# Patient Record
Sex: Male | Born: 1975 | Race: Black or African American | Hispanic: No | Marital: Single | Smoking: Current every day smoker
Health system: Southern US, Community
[De-identification: ages and names within clinical notes are randomized; demographics above are authoritative.]

## PROBLEM LIST (undated history)

## (undated) DIAGNOSIS — G40909 Epilepsy, unspecified, not intractable, without status epilepticus: Secondary | ICD-10-CM

---

## 2003-04-27 ENCOUNTER — Emergency Department (HOSPITAL_COMMUNITY): Admission: AD | Admit: 2003-04-27 | Discharge: 2003-04-27 | Payer: Self-pay | Admitting: Family Medicine

## 2003-10-08 ENCOUNTER — Emergency Department (HOSPITAL_COMMUNITY): Admission: EM | Admit: 2003-10-08 | Discharge: 2003-10-09 | Payer: Self-pay | Admitting: Emergency Medicine

## 2003-10-20 ENCOUNTER — Emergency Department (HOSPITAL_COMMUNITY): Admission: EM | Admit: 2003-10-20 | Discharge: 2003-10-20 | Payer: Self-pay | Admitting: Family Medicine

## 2016-03-13 ENCOUNTER — Emergency Department (HOSPITAL_COMMUNITY)
Admission: EM | Admit: 2016-03-13 | Discharge: 2016-03-13 | Disposition: A | Discharge status: Home / Self Care | Payer: Self-pay | Attending: Emergency Medicine | Admitting: Emergency Medicine

## 2016-03-13 ENCOUNTER — Encounter (HOSPITAL_COMMUNITY): Payer: Self-pay | Admitting: Emergency Medicine

## 2016-03-13 ENCOUNTER — Emergency Department (HOSPITAL_COMMUNITY): Payer: Self-pay

## 2016-03-13 DIAGNOSIS — M62830 Muscle spasm of back: Secondary | ICD-10-CM | POA: Insufficient documentation

## 2016-03-13 DIAGNOSIS — Z79899 Other long term (current) drug therapy: Secondary | ICD-10-CM | POA: Insufficient documentation

## 2016-03-13 DIAGNOSIS — M545 Low back pain, unspecified: Secondary | ICD-10-CM

## 2016-03-13 DIAGNOSIS — F17200 Nicotine dependence, unspecified, uncomplicated: Secondary | ICD-10-CM | POA: Insufficient documentation

## 2016-03-13 MED ORDER — CYCLOBENZAPRINE HCL 10 MG PO TABS: 10.0000 mg | ORAL_TABLET | Freq: Two times a day (BID) | ORAL | 0 refills | Status: DC | PRN

## 2016-03-13 MED ORDER — OXYCODONE-ACETAMINOPHEN 5-325 MG PO TABS
1.0000 | ORAL_TABLET | Freq: Once | ORAL | Status: AC
  Administered 2016-03-13: 1 via ORAL
  Filled 2016-03-13: qty 1

## 2016-03-13 MED ORDER — CYCLOBENZAPRINE HCL 10 MG PO TABS
10.0000 mg | ORAL_TABLET | Freq: Once | ORAL | Status: AC
  Administered 2016-03-13: 10 mg via ORAL
  Filled 2016-03-13: qty 1

## 2016-03-13 MED ORDER — OXYCODONE-ACETAMINOPHEN 5-325 MG PO TABS: 1.0000 | ORAL_TABLET | Freq: Four times a day (QID) | ORAL | 0 refills | Status: DC | PRN

## 2016-03-13 NOTE — Discharge Instructions (Signed)
Please follow-up with a PCP for further management of your low back pain. Please take your medications if the symptoms are worsened. If you develop any new symptoms such as leg weakness, leg numbness, or loss of control of her bowel or bladder, please return to the nearest emergency department.

## 2016-03-13 NOTE — ED Provider Notes (Signed)
[facility]-EMERGENCY DEPT Provider Note   CSN: [id number] Arrival date & time: 03/13/16  [id number]     History   Chief Complaint Chief Complaint  Patient presents with  . Back Pain    HPI Charles Aguilar is a 40 y.o. 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 He described the pain as severe, nonradiating, and across his low back. He describes it as sharp and shooting. He says that movement, palpation, and twisting make it worse. He has pain with certain positions. He denies loss of bowel or bladder function, numbness, tingling, weakness, or problems walking. He has not taken any medicine to help his symptoms. He denies any other injury specifically, no chest pain, shortness of breath, abdominal pain, or leg pain.   Back Pain   This is a new problem. The current episode started 2 days ago. The problem occurs constantly. The problem has not changed since onset.The pain is associated with lifting heavy objects. The pain is present in the lumbar spine. The quality of the pain is described as shooting and aching. The pain does not radiate. The pain is at a severity of 7/10. The pain is moderate. The symptoms are aggravated by bending, twisting and certain positions. The pain is worse during the day. Pertinent negatives include no chest pain, no fever, no numbness, no headaches, no abdominal pain, no abdominal swelling, no bowel incontinence, no perianal numbness, no bladder incontinence, no dysuria, no pelvic pain, no leg pain, no paresthesias, no paresis, no tingling and no weakness. He has tried nothing for the symptoms. The treatment provided no relief.    History reviewed. No pertinent past medical history.  There are no active problems to display for this  patient.   History reviewed. No pertinent surgical history.     Home Medications    Prior to Admission medications   Not on File    Family History No family history on file.  Social History Social History  Substance Use Topics  . Smoking status: Current Some Day Smoker  . Smokeless tobacco: Never Used  . Alcohol use Yes     Comment: Occasionally     Allergies   Review of patient's allergies indicates not on file.   Review of Systems Review of Systems  Constitutional: Negative for activity change, chills, diaphoresis, fatigue and fever.  HENT: Negative for congestion and rhinorrhea.   Eyes: Negative for visual disturbance.  Respiratory: Negative for cough, chest tightness, shortness of breath, wheezing and stridor.   Cardiovascular: Negative for chest pain, palpitations and leg swelling.  Gastrointestinal: Negative for abdominal distention, abdominal pain, blood in stool, bowel incontinence, constipation, diarrhea, nausea and vomiting.  Genitourinary: Negative for bladder incontinence, difficulty urinating, dysuria, flank pain and pelvic pain.  Musculoskeletal: Positive for back pain. Negative for gait problem, neck pain and neck stiffness.  Skin: Negative for rash and wound.  Neurological: Negative for dizziness, tingling, weakness, light-headedness, numbness, headaches and paresthesias.  Psychiatric/Behavioral: Negative for agitation and confusion.  All other systems reviewed and are negative.    Physical Exam Updated Vital Signs BP (!) 152/117 (BP Location: Left Arm)   Pulse 90   Temp 97.7 F (36.5 C) (Oral)   Resp 18   Ht 6\' 2"  (1.88 m)   Wt  173 lb (78.5 kg)   SpO2 100%   BMI 22.21 kg/m   Physical Exam  Constitutional: He is oriented to person, place, and time. He appears well-developed and well-nourished.  HENT:  Head: Normocephalic and atraumatic.  Eyes: Conjunctivae are normal.  Neck: Neck supple.  Cardiovascular: Normal rate and regular  rhythm.   No murmur heard. Pulmonary/Chest: Effort normal and breath sounds normal. No respiratory distress.  Abdominal: Soft. There is no tenderness.  Musculoskeletal: He exhibits tenderness. He exhibits no edema.       Lumbar back: He exhibits tenderness, pain and spasm. He exhibits no bony tenderness.       Back:  Neurological: He is alert and oriented to person, place, and time. He has normal strength and normal reflexes. He is not disoriented. He displays no tremor and normal reflexes. No cranial nerve deficit or sensory deficit. He exhibits normal muscle tone. Coordination and gait normal. GCS eye subscore is 4. GCS verbal subscore is 5. GCS motor subscore is 6.  Skin: Skin is warm and dry.  Psychiatric: He has a normal mood and affect.  Nursing note and vitals reviewed.    ED Treatments / Results  Labs (all labs ordered are listed, but only abnormal results are displayed) Labs Reviewed - No data to display  EKG  EKG Interpretation None       Radiology Dg Lumbar Spine Complete  Result Date: 03/13/2016 CLINICAL DATA:  Mid low back pain with progression since lifting a box at work 2 days ago. EXAM: LUMBAR SPINE - COMPLETE 4+ VIEW COMPARISON:  None. FINDINGS: Five non rib-bearing lumbar type vertebral bodies are present. Vertebral body heights alignment are maintained. Disc spaces are maintained as well. There is some straightening of the normal lumbar lordosis. No focal lytic or blastic lesions are present. There is some suggestion of prominent disc at L4-5 and L5-S1. The SI joints are within normal limits. IMPRESSION: 1. No acute osseous abnormality. 2. There is some straightening of the normal lumbar lordosis. This is nonspecific, but can be seen in the setting of ongoing muscle strain or pain. 3. Question disc protrusion at L4-5 and/or L5-S1. Electronically Signed   By: Marin Robertshristopher  Mattern M.D.   On: 03/13/2016 10:43    Procedures Procedures (including critical care  time)  Medications Ordered in ED Medications  oxyCODONE-acetaminophen (PERCOCET/ROXICET) 5-325 MG per tablet 1 tablet (1 tablet Oral Given 03/13/16 1041)  cyclobenzaprine (FLEXERIL) tablet 10 mg (10 mg Oral Given 03/13/16 1041)     Initial Impression / Assessment and Plan / ED Course  I have reviewed the triage vital signs and the nursing notes.  Pertinent labs & imaging results that were available during my care of the patient were reviewed by me and considered in my medical decision making (see chart for details).  Clinical Course    Charles Aguilar is a 40 y.o. 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  History and exam are seen above.  Patient had tenderness on the paraspinal areas of his lumbar spine. No midline tenderness. No neurologic deficits and legs. New numbness, tingling, or weakness. Normal gait. Normal reflexes. Normal pulses. Clear lungs and nontender abdomen. No red flags for spinal injury.  Based on patient description of acute onset of back pain while lifting heavy object, suspect back strain as etiology of pain. X-ray obtained to look for malalignment or fracture.  X-ray showed No osseous abnormality and did show  evidence of muscle spasm/strain. They also questioned disk protrusion. Given lack of neurologic deficits and lower extremities or sciatic type pain, will hold on further imaging at this time.  Patient felt somewhat better after pain medicine and muscle relaxant.  Given reassuring imaging and reassuring exam, patient given prescriptions for pain medicine and muscle relaxant. Patient instructed to follow up with PCP for further ongoing back pain management. Patient given work note. Patient understood return precautions for any signs or symptoms of spinal cord injury. Patient had no other questions or concerns and was discharged in good condition.   Final Clinical Impressions(s) / ED  Diagnoses   Final diagnoses:  Acute bilateral low back pain without sciatica  Muscle spasm of back    New Prescriptions Discharge Medication List as of 03/13/2016 12:01 PM    START taking these medications   Details  cyclobenzaprine (FLEXERIL) 10 MG tablet Take 1 tablet (10 mg total) by mouth 2 (two) times daily as needed for muscle spasms., Starting Wed 03/13/2016, Print    oxyCODONE-acetaminophen (PERCOCET/ROXICET) 5-325 MG tablet Take 1 tablet by mouth every 6 (six) hours as needed for severe pain., Starting Wed 03/13/2016, Print        Clinical Impression: 1. Acute bilateral low back pain without sciatica   2. Muscle spasm of back     Disposition: Discharge  Condition: Good  I have discussed the results, Dx and Tx plan with the pt(& family if present). He/she/they expressed understanding and agree(s) with the plan. Discharge instructions discussed at great length. Strict return precautions discussed and pt &/or family have verbalized understanding of the instructions. No further questions at time of discharge.    Discharge Medication List as of 03/13/2016 12:01 PM    START taking these medications   Details  cyclobenzaprine (FLEXERIL) 10 MG tablet Take 1 tablet (10 mg total) by mouth 2 (two) times daily as needed for muscle spasms., Starting Wed 03/13/2016, Print    oxyCODONE-acetaminophen (PERCOCET/ROXICET) 5-325 MG tablet Take 1 tablet by mouth every 6 (six) hours as needed for severe pain., Starting Wed 03/13/2016, Print        Follow Up: [facility] [address] [place] [address] [phone]       Heide Scales, MD 03/13/16 2002

## 2016-03-13 NOTE — ED Triage Notes (Addendum)
Patient reports lower back pain since Monday. Reports lifting something heavy at work and has had pain ever since. Patient is ambulatory. Denies bowel or bladder changes. Denies numbness.

## 2017-05-01 ENCOUNTER — Other Ambulatory Visit: Payer: Self-pay

## 2017-05-01 ENCOUNTER — Encounter (HOSPITAL_BASED_OUTPATIENT_CLINIC_OR_DEPARTMENT_OTHER): Payer: Self-pay | Admitting: Emergency Medicine

## 2017-05-01 ENCOUNTER — Emergency Department (HOSPITAL_BASED_OUTPATIENT_CLINIC_OR_DEPARTMENT_OTHER): Payer: Self-pay

## 2017-05-01 ENCOUNTER — Emergency Department (HOSPITAL_BASED_OUTPATIENT_CLINIC_OR_DEPARTMENT_OTHER)
Admission: EM | Admit: 2017-05-01 | Discharge: 2017-05-01 | Disposition: A | Discharge status: Home / Self Care | Payer: Self-pay | Attending: Emergency Medicine | Admitting: Emergency Medicine

## 2017-05-01 DIAGNOSIS — J189 Pneumonia, unspecified organism: Secondary | ICD-10-CM | POA: Insufficient documentation

## 2017-05-01 DIAGNOSIS — F17200 Nicotine dependence, unspecified, uncomplicated: Secondary | ICD-10-CM | POA: Insufficient documentation

## 2017-05-01 LAB — BASIC METABOLIC PANEL
Anion gap: 15 (ref 5–15)
BUN: 8 mg/dL (ref 6–20)
CO2: 23 mmol/L (ref 22–32)
Calcium: 9 mg/dL (ref 8.9–10.3)
Chloride: 91 mmol/L — ABNORMAL LOW (ref 101–111)
Creatinine, Ser: 0.78 mg/dL (ref 0.61–1.24)
GFR calc Af Amer: >60 mL/min (ref >60)
GFR calc non Af Amer: >60 mL/min (ref >60)
Glucose, Bld: 114 mg/dL — ABNORMAL HIGH (ref 65–99)
Potassium: 3.6 mmol/L (ref 3.5–5.1)
Sodium: 129 mmol/L — ABNORMAL LOW (ref 135–145)

## 2017-05-01 LAB — CBC WITH DIFFERENTIAL/PLATELET
Basophils Absolute: 0 10*3/uL (ref 0.0–0.1)
Basophils Relative: 0 %
Eosinophils Absolute: 0 10*3/uL (ref 0.0–0.7)
Eosinophils Relative: 0 %
HCT: 42 % (ref 39.0–52.0)
Hemoglobin: 14.7 g/dL (ref 13.0–17.0)
Lymphocytes Relative: 10 %
Lymphs Abs: 1 10*3/uL (ref 0.7–4.0)
MCH: 34.2 pg — ABNORMAL HIGH (ref 26.0–34.0)
MCHC: 35 g/dL (ref 30.0–36.0)
MCV: 97.7 fL (ref 78.0–100.0)
Monocytes Absolute: 1.5 10*3/uL — ABNORMAL HIGH (ref 0.1–1.0)
Monocytes Relative: 15 %
Neutro Abs: 7.6 10*3/uL (ref 1.7–7.7)
Neutrophils Relative %: 75 %
Platelets: 224 10*3/uL (ref 150–400)
RBC: 4.3 MIL/uL (ref 4.22–5.81)
RDW: 11 % — ABNORMAL LOW (ref 11.5–15.5)
WBC: 10.1 10*3/uL (ref 4.0–10.5)

## 2017-05-01 LAB — I-STAT CG4 LACTIC ACID, ED: Lactic Acid, Venous: 1.45 mmol/L (ref 0.5–1.9)

## 2017-05-01 LAB — LACTATE DEHYDROGENASE: LDH: 155 U/L (ref 98–192)

## 2017-05-01 LAB — HEPATIC FUNCTION PANEL
ALT: 78 U/L — ABNORMAL HIGH (ref 17–63)
AST: 83 U/L — ABNORMAL HIGH (ref 15–41)
Albumin: 3 g/dL — ABNORMAL LOW (ref 3.5–5.0)
Alkaline Phosphatase: 72 U/L (ref 38–126)
Bilirubin, Direct: 0.2 mg/dL (ref 0.1–0.5)
Indirect Bilirubin: 0.6 mg/dL (ref 0.3–0.9)
Total Bilirubin: 0.8 mg/dL (ref 0.3–1.2)
Total Protein: 7.3 g/dL (ref 6.5–8.1)

## 2017-05-01 MED ORDER — SODIUM CHLORIDE 0.9 % IV BOLUS (SEPSIS)
1000.0000 mL | Freq: Once | INTRAVENOUS | Status: AC
  Administered 2017-05-01: 1000 mL via INTRAVENOUS

## 2017-05-01 MED ORDER — NAPROXEN 500 MG PO TABS: 500.0000 mg | ORAL_TABLET | Freq: Two times a day (BID) | ORAL | 0 refills | Status: DC

## 2017-05-01 MED ORDER — LEVOFLOXACIN 500 MG PO TABS: 500.0000 mg | ORAL_TABLET | Freq: Every day | ORAL | 0 refills | Status: DC

## 2017-05-01 MED ORDER — BENZONATATE 100 MG PO CAPS: 100.0000 mg | ORAL_CAPSULE | Freq: Three times a day (TID) | ORAL | 0 refills | Status: DC

## 2017-05-01 MED ORDER — KETOROLAC TROMETHAMINE 30 MG/ML IJ SOLN
30.0000 mg | Freq: Once | INTRAMUSCULAR | Status: AC
  Administered 2017-05-01: 30 mg via INTRAVENOUS
  Filled 2017-05-01: qty 1

## 2017-05-01 MED ORDER — LEVOFLOXACIN 750 MG PO TABS
750.0000 mg | ORAL_TABLET | Freq: Once | ORAL | Status: AC
  Administered 2017-05-01: 750 mg via ORAL
  Filled 2017-05-01: qty 1

## 2017-05-01 MED ORDER — ONDANSETRON HCL 4 MG/2ML IJ SOLN
4.0000 mg | Freq: Once | INTRAMUSCULAR | Status: AC
  Administered 2017-05-01: 4 mg via INTRAVENOUS
  Filled 2017-05-01: qty 2

## 2017-05-01 MED ORDER — PROMETHAZINE-DM 6.25-15 MG/5ML PO SYRP: 5.0000 mL | ORAL_SOLUTION | Freq: Four times a day (QID) | ORAL | 0 refills | Status: DC | PRN

## 2017-05-01 MED ORDER — ONDANSETRON HCL 4 MG/2ML IJ SOLN
INTRAMUSCULAR | Status: AC
  Filled 2017-05-01: qty 2

## 2017-05-01 NOTE — ED Provider Notes (Addendum)
[facility] EMERGENCY DEPARTMENT Provider Note   CSN: [id number] Arrival date & time: 05/01/17  1646     History   Chief Complaint Chief Complaint  Patient presents with  . Fever  . Cough    HPI Wilnette KalesShuntrey Len is a 41 y.o. male. Chief complaint is fever, cough, body aches  HPI 41 year old male with a three-day illness. Body aches cough and fever. Had a coworker that was ill with similar symptoms one week ago. No hemoptysis. No sudden single Reiger. Nausea. No vomiting. Poor appetite. He presents for evaluation. Generalized weakness. Not lightheaded or syncopal.  History reviewed. No pertinent past medical history.  There are no active problems to display for this patient.   History reviewed. No pertinent surgical history.     Home Medications    Prior to Admission medications   Medication Sig Start Date End Date Taking? Authorizing Provider  benzonatate (TESSALON) 100 MG capsule Take 1 capsule (100 mg total) by mouth every 8 (eight) hours. 05/01/17   Rolland PorterJames, Aarya Quebedeaux, MD  cyclobenzaprine (FLEXERIL) 10 MG tablet Take 1 tablet (10 mg total) by mouth 2 (two) times daily as needed for muscle spasms. 03/13/16   Tegeler, Canary Brimhristopher J, MD  ibuprofen (ADVIL,MOTRIN) 200 MG tablet Take 200-800 mg by mouth every 6 (six) hours as needed for headache or moderate pain.    [provider]  levofloxacin (LEVAQUIN) 500 MG tablet Take 1 tablet (500 mg total) by mouth daily. 05/01/17   Rolland PorterJames, Mackenzy Grumbine, MD  naproxen (NAPROSYN) 500 MG tablet Take 1 tablet (500 mg total) by mouth 2 (two) times daily. 05/01/17   Rolland PorterJames, Atziry Baranski, MD  oxyCODONE-acetaminophen (PERCOCET/ROXICET) 5-325 MG tablet Take 1 tablet by mouth every 6 (six) hours as needed for severe pain. 03/13/16   Tegeler, Canary Brimhristopher J, MD  promethazine-dextromethorphan (PROMETHAZINE-DM) 6.25-15 MG/5ML syrup Take 5 mLs by mouth 4 (four) times daily as needed for cough. 05/01/17   Rolland PorterJames, Adelie Croswell, MD    Family History History  reviewed. No pertinent family history.  Social History Social History   Tobacco Use  . Smoking status: Current Some Day Smoker  . Smokeless tobacco: Never Used  Substance Use Topics  . Alcohol use: Yes    Comment: Occasionally  . Drug use: Not on file     Allergies   Penicillins   Review of Systems Review of Systems  Constitutional: Positive for chills, fatigue and fever. Negative for appetite change and diaphoresis.  HENT: Negative for mouth sores, sore throat and trouble swallowing.   Eyes: Negative for visual disturbance.  Respiratory: Positive for cough. Negative for chest tightness, shortness of breath and wheezing.   Cardiovascular: Negative for chest pain.  Gastrointestinal: Negative for abdominal distention, abdominal pain, diarrhea, nausea and vomiting.  Endocrine: Negative for polydipsia, polyphagia and polyuria.  Genitourinary: Negative for dysuria, frequency and hematuria.  Musculoskeletal: Positive for arthralgias and myalgias. Negative for gait problem.  Skin: Negative for color change, pallor and rash.  Neurological: Positive for weakness. Negative for dizziness, syncope, light-headedness and headaches.  Hematological: Does not bruise/bleed easily.  Psychiatric/Behavioral: Negative for behavioral problems and confusion.     Physical Exam Updated Vital Signs BP 140/68 (BP Location: Left Arm)   Pulse 84   Temp 98.6 F (37 C) (Oral)   Resp 18   SpO2 100%   Physical Exam  Constitutional: He is oriented to person, place, and time. He appears well-developed and well-nourished. No distress.  HENT:  Head: Normocephalic.  Eyes: Conjunctivae are normal. Pupils are  equal, round, and reactive to light. No scleral icterus.  Neck: Normal range of motion. Neck supple. No thyromegaly present.  Cardiovascular: Normal rate and regular rhythm. Exam reveals no gallop and no friction rub.  No murmur heard. Pulmonary/Chest: Effort normal and breath sounds normal. No  respiratory distress. He has no wheezes. He has no rales.  Right basilar crackles.  Abdominal: Soft. Bowel sounds are normal. He exhibits no distension. There is no tenderness. There is no rebound.  Musculoskeletal: Normal range of motion.  Neurological: He is alert and oriented to person, place, and time.  Skin: Skin is warm and dry. No rash noted.  Psychiatric: He has a normal mood and affect. His behavior is normal.     ED Treatments / Results  Labs (all labs ordered are listed, but only abnormal results are displayed) Labs Reviewed  CBC WITH DIFFERENTIAL/PLATELET - Abnormal; Notable for the following components:      Result Value   MCH 34.2 (*)    RDW 11.0 (*)    Monocytes Absolute 1.5 (*)    All other components within normal limits  BASIC METABOLIC PANEL - Abnormal; Notable for the following components:   Sodium 129 (*)    Chloride 91 (*)    Glucose, Bld 114 (*)    All other components within normal limits  HEPATIC FUNCTION PANEL - Abnormal; Notable for the following components:   Albumin 3.0 (*)    AST 83 (*)    ALT 78 (*)    All other components within normal limits  LACTATE DEHYDROGENASE  I-STAT CG4 LACTIC ACID, ED    EKG  EKG Interpretation None       Radiology Dg Chest 2 View  Result Date: 05/01/2017 CLINICAL DATA:  Fever with nausea, vomiting and diarrhea as well as sore throat generalized chest pain. Productive cough. EXAM: CHEST  2 VIEW COMPARISON:  None. FINDINGS: Lungs are adequately inflated demonstrate a bilateral patchy perihilar and lingular airspace process likely a pneumonia. No evidence of effusion. Cardiomediastinal silhouette and remainder of the exam is unremarkable. IMPRESSION: Patchy bilateral perihilar and lingular airspace process likely a pneumonia. Electronically Signed   By: Elberta Fortisaniel  Boyle M.D.   On: 05/01/2017 18:09    Procedures Procedures (including critical care time)  Medications Ordered in ED Medications  ondansetron (ZOFRAN)  injection 4 mg (not administered)  sodium chloride 0.9 % bolus 1,000 mL (0 mLs Intravenous Stopping Infusion hung by another clincian 05/01/17 1826)  ketorolac (TORADOL) 30 MG/ML injection 30 mg (30 mg Intravenous Given 05/01/17 1826)  levofloxacin (LEVAQUIN) tablet 750 mg (750 mg Oral Given 05/01/17 1826)  sodium chloride 0.9 % bolus 1,000 mL (1,000 mLs Intravenous New Bag/Given 05/01/17 1831)     Initial Impression / Assessment and Plan / ED Course  I have reviewed the triage vital signs and the nursing notes.  Pertinent labs & imaging results that were available during my care of the patient were reviewed by me and considered in my medical decision making (see chart for details).    Appears to not feel well. But he is awake alert and mentating well. Does not appear septic or toxic. Reassuring lactate. Slight hyponatremia. Minimal elevation of AST ALT. Chest x-ray shows bilateral bibasilar infiltrates. Not lobar. But not diffuse. Consideration will be given for streptococcal pneumonia. May be atypical as well. Levaquin would be good coverage considering his penicillins.   Final Clinical Impressions(s) / ED Diagnoses   Final diagnoses:  Community acquired pneumonia, unspecified laterality  Patient given IV fluids. Given by mouth Levaquin. Oral. Zofran. Taking by mouth. Feels "much much better". Oxygenating well. Latoya without tachycardia or hypoxemia. Discharge home. Levaquin, Phenergan DM, Zofran, primary care follow-up. ER with acute changes.  ED Discharge Orders        Ordered    promethazine-dextromethorphan (PROMETHAZINE-DM) 6.25-15 MG/5ML syrup  4 times daily PRN     05/01/17 2041    levofloxacin (LEVAQUIN) 500 MG tablet  Daily     05/01/17 2041    naproxen (NAPROSYN) 500 MG tablet  2 times daily     05/01/17 2041    benzonatate (TESSALON) 100 MG capsule  Every 8 hours     05/01/17 2041       Rolland Porter, MD 05/01/17 2047    Rolland Porter, MD 05/01/17 2048

## 2017-05-01 NOTE — ED Triage Notes (Signed)
Pt reports fever and cough x 3 days , chest pain when coughing. Body ache and been exposed to sick coworker and has a sick child at home with similar symptoms.

## 2017-05-01 NOTE — Discharge Instructions (Signed)
Push fluids to stay hydrated. Levaquin, antibiotics, as prescribed for 10 days. Tessalon for daytime cough. Phenergan DM for nighttime cough. Naproxen for body aches and pain Return to ER with worsening.

## 2017-05-01 NOTE — ED Notes (Signed)
ED Provider at bedside. 

## 2017-05-01 NOTE — ED Notes (Signed)
Patient transported to X-ray 

## 2018-09-01 ENCOUNTER — Emergency Department (HOSPITAL_BASED_OUTPATIENT_CLINIC_OR_DEPARTMENT_OTHER)
Admission: EM | Admit: 2018-09-01 | Discharge: 2018-09-01 | Disposition: A | Discharge status: Home / Self Care | Payer: Managed Care, Other (non HMO) | Attending: Emergency Medicine | Admitting: Emergency Medicine

## 2018-09-01 ENCOUNTER — Encounter (HOSPITAL_BASED_OUTPATIENT_CLINIC_OR_DEPARTMENT_OTHER): Payer: Self-pay | Admitting: Emergency Medicine

## 2018-09-01 ENCOUNTER — Other Ambulatory Visit: Payer: Self-pay

## 2018-09-01 DIAGNOSIS — F17210 Nicotine dependence, cigarettes, uncomplicated: Secondary | ICD-10-CM | POA: Insufficient documentation

## 2018-09-01 DIAGNOSIS — Z79899 Other long term (current) drug therapy: Secondary | ICD-10-CM | POA: Insufficient documentation

## 2018-09-01 DIAGNOSIS — R112 Nausea with vomiting, unspecified: Secondary | ICD-10-CM

## 2018-09-01 DIAGNOSIS — R55 Syncope and collapse: Secondary | ICD-10-CM

## 2018-09-01 DIAGNOSIS — R531 Weakness: Secondary | ICD-10-CM | POA: Diagnosis present

## 2018-09-01 DIAGNOSIS — R1110 Vomiting, unspecified: Secondary | ICD-10-CM | POA: Insufficient documentation

## 2018-09-01 NOTE — ED Notes (Signed)
Patient denies pain and is resting comfortably.  

## 2018-09-01 NOTE — ED Provider Notes (Signed)
[facility] EMERGENCY DEPARTMENT Provider Note   CSN: [id number] Arrival date & time: 09/01/18  2124    History   Chief Complaint Chief Complaint  Patient presents with  . Abdominal Pain    HPI Charles Aguilar is a 43 y.o. male.     HPI  42yM with vague feeling of weakness. Today around 3pm he started feeling weak.  States he went and used the restroom and had a small bowel movement and when he was done he felt weak and had something to drink and after he did he had some vomiting that was yellow in color  States he has only had the one episode of vomiting and only the one small episode of loose stool  Pt states now he feels weak all over  Denies pain at this time or previously.    History reviewed. No pertinent past medical history.  There are no active problems to display for this patient.   History reviewed. No pertinent surgical history.      Home Medications    Prior to Admission medications   Medication Sig Start Date End Date Taking? Authorizing Provider  benzonatate (TESSALON) 100 MG capsule Take 1 capsule (100 mg total) by mouth every 8 (eight) hours. 05/01/17   Rolland PorterJames, Mark, MD  cyclobenzaprine (FLEXERIL) 10 MG tablet Take 1 tablet (10 mg total) by mouth 2 (two) times daily as needed for muscle spasms. 03/13/16   Tegeler, Canary Brimhristopher J, MD  ibuprofen (ADVIL,MOTRIN) 200 MG tablet Take 200-800 mg by mouth every 6 (six) hours as needed for headache or moderate pain.    [provider]  levofloxacin (LEVAQUIN) 500 MG tablet Take 1 tablet (500 mg total) by mouth daily. 05/01/17   Rolland PorterJames, Mark, MD  naproxen (NAPROSYN) 500 MG tablet Take 1 tablet (500 mg total) by mouth 2 (two) times daily. 05/01/17   Rolland PorterJames, Mark, MD  oxyCODONE-acetaminophen (PERCOCET/ROXICET) 5-325 MG tablet Take 1 tablet by mouth every 6 (six) hours as needed for severe pain. 03/13/16   Tegeler, Canary Brimhristopher J, MD  promethazine-dextromethorphan (PROMETHAZINE-DM) 6.25-15 MG/5ML syrup  Take 5 mLs by mouth 4 (four) times daily as needed for cough. 05/01/17   Rolland PorterJames, Mark, MD    Family History Family History  Problem Relation Age of Onset  . Diabetes Mother   . Diabetes Father     Social History Social History   Tobacco Use  . Smoking status: Current Every Day Smoker    Types: Cigarettes, Cigars  . Smokeless tobacco: Never Used  Substance Use Topics  . Alcohol use: Yes    Comment: Occasionally  . Drug use: Never     Allergies   Penicillins   Review of Systems Review of Systems  All systems reviewed and negative, other than as noted in HPI.  Physical Exam Updated Vital Signs BP (!) 137/96 (BP Location: Right Arm)   Pulse 100   Temp 98.4 F (36.9 C) (Oral)   Resp 18   Ht 6\' 2"  (1.88 m)   Wt 75.8 kg   SpO2 100%   BMI 21.44 kg/m   Physical Exam Vitals signs and nursing note reviewed.  Constitutional:      General: He is not in acute distress.    Appearance: He is well-developed.  HENT:     Head: Normocephalic and atraumatic.  Eyes:     General:        Right eye: No discharge.        Left eye: No discharge.  Conjunctiva/sclera: Conjunctivae normal.  Neck:     Musculoskeletal: Neck supple.  Cardiovascular:     Rate and Rhythm: Normal rate and regular rhythm.     Heart sounds: Normal heart sounds. No murmur. No friction rub. No gallop.   Pulmonary:     Effort: Pulmonary effort is normal. No respiratory distress.     Breath sounds: Normal breath sounds.  Abdominal:     General: There is no distension.     Palpations: Abdomen is soft.     Tenderness: There is no abdominal tenderness.  Musculoskeletal:        General: No tenderness.  Skin:    General: Skin is warm and dry.  Neurological:     General: No focal deficit present.     Mental Status: He is alert and oriented to person, place, and time.     Cranial Nerves: No cranial nerve deficit.     Motor: No weakness.  Psychiatric:        Mood and Affect: Mood normal.         Behavior: Behavior normal.        Thought Content: Thought content normal.      ED Treatments / Results  Labs (all labs ordered are listed, but only abnormal results are displayed) Labs Reviewed - No data to display  EKG EKG Interpretation  Date/Time:  Tuesday September 01 2018 22:03:44 EDT Ventricular Rate:  80 PR Interval:    QRS Duration: 85 QT Interval:  395 QTC Calculation: 456 R Axis:   97 Text Interpretation:  Sinus rhythm Anterolateral infarct, old Baseline wander in lead(s) I III aVL No old tracing to compare Confirmed by Raeford Razor [phone]) on 09/01/2018 10:05:46 PM   Radiology No results found.  Procedures Procedures (including critical care time)  Medications Ordered in ED Medications - No data to display   Initial Impression / Assessment and Plan / ED Course  I have reviewed the triage vital signs and the nursing notes.  Pertinent labs & imaging results that were available during my care of the patient were reviewed by me and considered in my medical decision making (see chart for details).  42yM with what I think may have been a near syncopal event? No essentially symptom free although still feels vaguely weak. I think he has significant concern about this possibly being COVID related as he mentioned a couple times about wanting to be checked out because "I have a daughter in the house." He appears well. Afebrile. No respiratory complaints. o2 sats normal on RA. Has been feeling fine up until this. Almost back to his baseline. Tried to reassure. Rest. Fluids. EKG w/o overt concerning findings. He declined further testing and I think it's probably not even necessary. Return precautions were discussed. He feels over work. Provided with a work note for tomorrow. Advised I want him rechecked if he isn't feeling like his normal self tomorrow or develops repeat/new symptoms.    Charles Aguilar was evaluated in Emergency Department on 09/04/2018 for the symptoms  described in the history of present illness. He was evaluated in the context of the global COVID-19 pandemic, which necessitated consideration that the patient might be at risk for infection with the SARS-CoV-2 virus that causes COVID-19. Institutional protocols and algorithms that pertain to the evaluation of patients at risk for COVID-19 are in a state of rapid change based on information released by regulatory bodies including the CDC and federal and state organizations. These policies and algorithms were followed  during the patient's care in the ED.  Final Clinical Impressions(s) / ED Diagnoses   Final diagnoses:  Nausea and vomiting, intractability of vomiting not specified, unspecified vomiting type  Near syncope    ED Discharge Orders    None       Raeford Razor, MD 09/04/18 2048

## 2018-09-01 NOTE — ED Triage Notes (Signed)
Today around 3pm he started feeling weak  States he went and used the restroom and had a small bowel movement and when he was done he felt weak and had something to drink and after he did he had some vomiting that was yellow in color  States he has only had the one episode of vomiting and only the one small episode of loose stool  Pt states now he feels weak all over  Denies pain at this time

## 2018-09-01 NOTE — ED Notes (Signed)
Pt. Reports he is exhausted from working and works day and night and is feeling so tired.

## 2018-10-28 ENCOUNTER — Emergency Department (HOSPITAL_BASED_OUTPATIENT_CLINIC_OR_DEPARTMENT_OTHER)
Admission: EM | Admit: 2018-10-28 | Discharge: 2018-10-28 | Disposition: A | Discharge status: Home / Self Care | Payer: Managed Care, Other (non HMO) | Attending: Emergency Medicine | Admitting: Emergency Medicine

## 2018-10-28 ENCOUNTER — Encounter (HOSPITAL_BASED_OUTPATIENT_CLINIC_OR_DEPARTMENT_OTHER): Payer: Self-pay

## 2018-10-28 ENCOUNTER — Other Ambulatory Visit: Payer: Self-pay

## 2018-10-28 DIAGNOSIS — Z79899 Other long term (current) drug therapy: Secondary | ICD-10-CM | POA: Diagnosis not present

## 2018-10-28 DIAGNOSIS — Z20828 Contact with and (suspected) exposure to other viral communicable diseases: Secondary | ICD-10-CM | POA: Insufficient documentation

## 2018-10-28 DIAGNOSIS — F17210 Nicotine dependence, cigarettes, uncomplicated: Secondary | ICD-10-CM | POA: Diagnosis not present

## 2018-10-28 DIAGNOSIS — R197 Diarrhea, unspecified: Secondary | ICD-10-CM | POA: Insufficient documentation

## 2018-10-28 DIAGNOSIS — F17290 Nicotine dependence, other tobacco product, uncomplicated: Secondary | ICD-10-CM | POA: Diagnosis not present

## 2018-10-28 DIAGNOSIS — R112 Nausea with vomiting, unspecified: Secondary | ICD-10-CM | POA: Insufficient documentation

## 2018-10-28 DIAGNOSIS — R748 Abnormal levels of other serum enzymes: Secondary | ICD-10-CM | POA: Diagnosis not present

## 2018-10-28 LAB — CBC WITH DIFFERENTIAL/PLATELET
Abs Immature Granulocytes: 0.02 10*3/uL (ref 0.00–0.07)
Basophils Absolute: 0 10*3/uL (ref 0.0–0.1)
Basophils Relative: 0 %
Eosinophils Absolute: 0 10*3/uL (ref 0.0–0.5)
Eosinophils Relative: 0 %
HCT: 42.5 % (ref 39.0–52.0)
Hemoglobin: 14.6 g/dL (ref 13.0–17.0)
Immature Granulocytes: 0 %
Lymphocytes Relative: 21 %
Lymphs Abs: 1.1 10*3/uL (ref 0.7–4.0)
MCH: 34.5 pg — ABNORMAL HIGH (ref 26.0–34.0)
MCHC: 34.4 g/dL (ref 30.0–36.0)
MCV: 100.5 fL — ABNORMAL HIGH (ref 80.0–100.0)
Monocytes Absolute: 0.7 10*3/uL (ref 0.1–1.0)
Monocytes Relative: 14 %
Neutro Abs: 3.5 10*3/uL (ref 1.7–7.7)
Neutrophils Relative %: 65 %
Platelets: 194 10*3/uL (ref 150–400)
RBC: 4.23 MIL/uL (ref 4.22–5.81)
RDW: 11.9 % (ref 11.5–15.5)
WBC: 5.4 10*3/uL (ref 4.0–10.5)
nRBC: 0 % (ref 0.0–0.2)

## 2018-10-28 LAB — COMPREHENSIVE METABOLIC PANEL
ALT: 100 U/L — ABNORMAL HIGH (ref 0–44)
AST: 125 U/L — ABNORMAL HIGH (ref 15–41)
Albumin: 4.5 g/dL (ref 3.5–5.0)
Alkaline Phosphatase: 102 U/L (ref 38–126)
Anion gap: 16 — ABNORMAL HIGH (ref 5–15)
BUN: 11 mg/dL (ref 6–20)
CO2: 24 mmol/L (ref 22–32)
Calcium: 9.6 mg/dL (ref 8.9–10.3)
Chloride: 93 mmol/L — ABNORMAL LOW (ref 98–111)
Creatinine, Ser: 0.8 mg/dL (ref 0.61–1.24)
GFR calc Af Amer: >60 mL/min (ref >60)
GFR calc non Af Amer: >60 mL/min (ref >60)
Glucose, Bld: 131 mg/dL — ABNORMAL HIGH (ref 70–99)
Potassium: 3.5 mmol/L (ref 3.5–5.1)
Sodium: 133 mmol/L — ABNORMAL LOW (ref 135–145)
Total Bilirubin: 1.5 mg/dL — ABNORMAL HIGH (ref 0.3–1.2)
Total Protein: 9.8 g/dL — ABNORMAL HIGH (ref 6.5–8.1)

## 2018-10-28 LAB — LIPASE, BLOOD: Lipase: 43 U/L (ref 11–51)

## 2018-10-28 MED ORDER — SODIUM CHLORIDE 0.9 % IV BOLUS
1000.0000 mL | Freq: Once | INTRAVENOUS | Status: AC
  Administered 2018-10-28: 12:00:00 1000 mL via INTRAVENOUS

## 2018-10-28 MED ORDER — ONDANSETRON 8 MG PO TBDP: 8.0000 mg | ORAL_TABLET | Freq: Three times a day (TID) | ORAL | 0 refills | Status: DC | PRN

## 2018-10-28 MED ORDER — ONDANSETRON HCL 4 MG/2ML IJ SOLN
4.0000 mg | Freq: Once | INTRAMUSCULAR | Status: AC
  Administered 2018-10-28: 4 mg via INTRAVENOUS
  Filled 2018-10-28: qty 2

## 2018-10-28 NOTE — ED Triage Notes (Signed)
Pt states beginning Tuesday nausea, vomiting, diarrhea, vomiting resolved today, able to hold down po fluids, decreased appetite, body aches.  Denies fever.

## 2018-10-28 NOTE — Discharge Instructions (Addendum)
Your liver enzymes need to be rechecked in 1 week (this can be completed by primary care physician, urgent care, or this emergency department)  Please decrease your alcohol consumption  Do not take Tylenol or other over-the-counter medications that could be liver toxic  Please return to the emergency department for any new or worsening symptoms

## 2018-10-28 NOTE — ED Provider Notes (Signed)
[facility] EMERGENCY DEPARTMENT Provider Note   CSN: [id number] Arrival date & time: 10/28/18  1050     History   Chief Complaint Chief Complaint  Patient presents with   Abdominal Pain   Nausea    HPI Charles Aguilar is a 43 y.o. male.     HPI Patient is a 43 year old male presents the emergency department with nausea vomiting and diarrhea.  He denies fevers and chills.  No known sick contacts.  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  Denies fevers and chills.  No cough or congestion.  Denies blood in his vomit or stool.  Reports crampy generalized abdominal discomfort and a feeling of nausea at this time without focal tenderness or pain.  Otherwise young healthy male without significant past medical history.  He does admit to drinking 2 beers every day   History reviewed. No pertinent past medical history.  There are no active problems to display for this patient.   History reviewed. No pertinent surgical history.      Home Medications    Prior to Admission medications   Medication Sig Start Date End Date Taking? Authorizing Provider  benzonatate (TESSALON) 100 MG capsule Take 1 capsule (100 mg total) by mouth every 8 (eight) hours. 05/01/17   Rolland PorterJames, Mark, MD  cyclobenzaprine (FLEXERIL) 10 MG tablet Take 1 tablet (10 mg total) by mouth 2 (two) times daily as needed for muscle spasms. 03/13/16   Tegeler, Canary Brimhristopher J, MD  ibuprofen (ADVIL,MOTRIN) 200 MG tablet Take 200-800 mg by mouth every 6 (six) hours as needed for headache or moderate pain.    [provider]  levofloxacin (LEVAQUIN) 500 MG tablet Take 1 tablet (500 mg total) by mouth daily. 05/01/17   Rolland PorterJames, Mark, MD  naproxen (NAPROSYN) 500 MG tablet Take 1 tablet (500 mg total) by mouth 2 (two) times daily. 05/01/17   Rolland PorterJames, Mark, MD  ondansetron (ZOFRAN ODT) 8 MG disintegrating tablet Take 1 tablet (8 mg total) by mouth every 8 (eight) hours as needed for nausea or vomiting. 10/28/18   Azalia Bilisampos,  Simmie Garin, MD  oxyCODONE-acetaminophen (PERCOCET/ROXICET) 5-325 MG tablet Take 1 tablet by mouth every 6 (six) hours as needed for severe pain. 03/13/16   Tegeler, Canary Brimhristopher J, MD  promethazine-dextromethorphan (PROMETHAZINE-DM) 6.25-15 MG/5ML syrup Take 5 mLs by mouth 4 (four) times daily as needed for cough. 05/01/17   Rolland PorterJames, Mark, MD    Family History Family History  Problem Relation Age of Onset   Diabetes Mother    Diabetes Father     Social History Social History   Tobacco Use   Smoking status: Current Every Day Smoker    Types: Cigarettes, Cigars   Smokeless tobacco: Never Used  Substance Use Topics   Alcohol use: Yes    Comment: Occasionally   Drug use: Never     Allergies   Penicillins   Review of Systems Review of Systems  All other systems reviewed and are negative.    Physical Exam Updated Vital Signs BP (!) 136/99    Pulse 71    Temp 98.4 F (36.9 C) (Oral)    Resp 16    Ht 6\' 2"  (1.88 m)    Wt 74.8 kg    SpO2 100%    BMI 21.18 kg/m   Physical Exam Vitals signs and nursing note reviewed.  Constitutional:      Appearance: He is well-developed.  HENT:     Head: Normocephalic and atraumatic.  Neck:     Musculoskeletal:  Normal range of motion.  Cardiovascular:     Rate and Rhythm: Normal rate and regular rhythm.     Heart sounds: Normal heart sounds.  Pulmonary:     Effort: Pulmonary effort is normal. No respiratory distress.     Breath sounds: Normal breath sounds.  Abdominal:     General: There is no distension.     Palpations: Abdomen is soft.     Tenderness: There is no abdominal tenderness.  Musculoskeletal: Normal range of motion.  Skin:    General: Skin is warm and dry.  Neurological:     Mental Status: He is alert and oriented to person, place, and time.  Psychiatric:        Judgment: Judgment normal.      ED Treatments / Results  Labs (all labs ordered are listed, but only abnormal results are displayed) Labs Reviewed    CBC WITH DIFFERENTIAL/PLATELET - Abnormal; Notable for the following components:      Result Value   MCV 100.5 (*)    MCH 34.5 (*)    All other components within normal limits  COMPREHENSIVE METABOLIC PANEL - Abnormal; Notable for the following components:   Sodium 133 (*)    Chloride 93 (*)    Glucose, Bld 131 (*)    Total Protein 9.8 (*)    AST 125 (*)    ALT 100 (*)    Total Bilirubin 1.5 (*)    Anion gap 16 (*)    All other components within normal limits  NOVEL CORONAVIRUS, NAA (HOSPITAL ORDER, SEND-OUT TO REF LAB)  LIPASE, BLOOD  HEPATITIS PANEL, ACUTE    EKG    Radiology No results found.  Procedures Procedures (including critical care time)  Medications Ordered in ED Medications  sodium chloride 0.9 % bolus 1,000 mL (0 mLs Intravenous Stopped 10/28/18 1254)  ondansetron (ZOFRAN) injection 4 mg (4 mg Intravenous Given 10/28/18 1151)     Initial Impression / Assessment and Plan / ED Course  I have reviewed the triage vital signs and the nursing notes.  Pertinent labs & imaging results that were available during my care of the patient were reviewed by me and considered in my medical decision making (see chart for details).        Overall well-appearing.  Improved here in the ER.  Mild elevation in his LFTs without focal abdominal pain.  He understands the importance of repeat liver enzymes in 1 week.  Acute hepatitis panel was sent.  I recommended decreasing his alcohol consumption.  He understands importance of close follow-up.  He understands to return to the emergency department for new or worsening symptoms  Final Clinical Impressions(s) / ED Diagnoses   Final diagnoses:  Nausea vomiting and diarrhea  Elevated liver enzymes    ED Discharge Orders         Ordered    ondansetron (ZOFRAN ODT) 8 MG disintegrating tablet  Every 8 hours PRN     10/28/18 1354           Jola Schmidt, MD 10/28/18 1409

## 2018-10-29 LAB — HEPATITIS PANEL, ACUTE
HCV Ab: 0.1 s/co ratio (ref 0.0–0.9)
Hep A IgM: NEGATIVE
Hep B C IgM: NEGATIVE
Hepatitis B Surface Ag: NEGATIVE

## 2018-10-29 LAB — NOVEL CORONAVIRUS, NAA (HOSP ORDER, SEND-OUT TO REF LAB; TAT 18-24 HRS): SARS-CoV-2, NAA: NOT DETECTED

## 2018-11-19 ENCOUNTER — Other Ambulatory Visit: Payer: Self-pay

## 2018-11-19 ENCOUNTER — Telehealth (INDEPENDENT_AMBULATORY_CARE_PROVIDER_SITE_OTHER): Payer: Managed Care, Other (non HMO) | Admitting: Gastroenterology

## 2018-11-19 ENCOUNTER — Encounter: Payer: Self-pay | Admitting: Gastroenterology

## 2018-11-19 VITALS — Ht 74.0 in | Wt 154.0 lb

## 2018-11-19 DIAGNOSIS — R7401 Elevation of levels of liver transaminase levels: Secondary | ICD-10-CM

## 2018-11-19 DIAGNOSIS — R197 Diarrhea, unspecified: Secondary | ICD-10-CM

## 2018-11-19 DIAGNOSIS — R740 Nonspecific elevation of levels of transaminase and lactic acid dehydrogenase [LDH]: Secondary | ICD-10-CM

## 2018-11-19 DIAGNOSIS — R112 Nausea with vomiting, unspecified: Secondary | ICD-10-CM

## 2018-11-19 DIAGNOSIS — R631 Polydipsia: Secondary | ICD-10-CM | POA: Diagnosis not present

## 2018-11-19 NOTE — Patient Instructions (Signed)
If you are age 43 or older, your body mass index should be between 23-30. Your Body mass index is 19.77 kg/m. If this is out of the aforementioned range listed, please consider follow up with your Primary Care Provider.  If you are age 59 or younger, your body mass index should be between 19-25. Your Body mass index is 19.77 kg/m. If this is out of the aformentioned range listed, please consider follow up with your Primary Care Provider.   Please go to the lab at [facility] ([place] You will need to go to level "B", you do not need an appointment for this. Hours available are 7:30 am - 4:30 pm. You will need to be fasting for this appointment.   Follow up in 3 months.   It was a pleasure to see you today!  Vito Cirigliano, D.O.

## 2018-11-19 NOTE — Progress Notes (Signed)
Chief Complaint: Abdominal pain, nausea/vomiting, diarrhea, elevated liver enzymes  Referring Provider:     Jola Schmidt, MD ([facility])  HPI:    Due to current restrictions/limitations of in-office visits due to the COVID-19 pandemic, this scheduled clinical appointment was converted to a telehealth virtual consultation using Doximity. The Doximity app failed during the call and was converted to a telephone call appointment.   -Time of medical discussion: 25 minutes -The patient did consent to this virtual visit and is aware of possible charges through their insurance for this visit.  -Names of all parties present: Geophysicist/field seismologist (patient), Gerrit Heck, DO, [facility] (physician) -Patient location: Home -Physician location: Office  Charles Aguilar is a 43 y.o. male referred to the Gastroenterology Clinic for evaluation of abdominal pain and nausea/vomiting/diarrhea, along with elevated LAE's.  He was seen in the ER 10/28/18 with these symptoms.  Evaluation notable for AST/ALT 125/100, T bili 1.5, ALP 102.  Elevated AST/ALT at 83/78 in 2018 (at time of PNA). Normal TBili in 2018, and no other labs for comparison in EMR.  Na 133 (was 129 in 2018).  Aside from MCV 100.5, normal CBC.  COVID negative.  Normal lipase, acute hepatitis panel.  No imaging.  Was discharged with Zofran, recommended to stop EtOH (drinks 2 beers/day), and repeat LAE's in 1 week with follow-up.  No new labs for review today.  Was then evaluated by [facility] on 7/3 for ongoing n/v and loose, non-bloody stools and weakness. Was recommended to go to [facility] treatment UC.  Exam benign.  Repeat labs demonstrate normal CBC, normal T bili at 0.6, AST/ALT 178/113 and ALP 126.  UA with large protein and moderate hemolyzed blood with small bilirubin.  Was given Rx for Protonix (has not picked up) and GI referral.  Today states, he has been having intermittent n/v/d and weakness since April. Abdominal pain only  occurs after emesis and described as MSK type pain. Has tried to maintain bland diet. No prior similar sxs. Was taking ASA daily for back pain. No APAP. Did have PNA in 04/2017, but no Abx since then. No sxs in the last 6 days or so. Had lost approx 10-14 lbs with sxs, but reports he has regained most and near baseline now (baseline 155-162). No f/c. No hematochezia or melena. Does have increased thirst and increased sweating.  Does not follow with any PCM.  Not taking any of the prescribed medications in the med list.  No known personal or FHx of hepatobiliary disease.   Past medical history, past surgical history, social history, family history, medications, and allergies reviewed in the chart and with patient.    History reviewed. No pertinent past medical history.   History reviewed. No pertinent surgical history. Family History  Problem Relation Age of Onset  . Diabetes Mother   . Diabetes Father   . Colon cancer Neg Hx    Social History   Tobacco Use  . Smoking status: Current Every Day Smoker    Types: Cigarettes, Cigars  . Smokeless tobacco: Never Used  Substance Use Topics  . Alcohol use: Yes    Comment: Occasionally  . Drug use: Never   Current Outpatient Medications  Medication Sig Dispense Refill  . benzonatate (TESSALON) 100 MG capsule Take 1 capsule (100 mg total) by mouth every 8 (eight) hours. 21 capsule 0  . cyclobenzaprine (FLEXERIL) 10 MG tablet Take 1 tablet (10 mg total)  by mouth 2 (two) times daily as needed for muscle spasms. 20 tablet 0  . ibuprofen (ADVIL,MOTRIN) 200 MG tablet Take 200-800 mg by mouth every 6 (six) hours as needed for headache or moderate pain.    Marland Kitchen. levofloxacin (LEVAQUIN) 500 MG tablet Take 1 tablet (500 mg total) by mouth daily. 10 tablet 0  . naproxen (NAPROSYN) 500 MG tablet Take 1 tablet (500 mg total) by mouth 2 (two) times daily. 30 tablet 0  . ondansetron (ZOFRAN ODT) 8 MG disintegrating tablet Take 1 tablet (8 mg total) by mouth  every 8 (eight) hours as needed for nausea or vomiting. 10 tablet 0  . oxyCODONE-acetaminophen (PERCOCET/ROXICET) 5-325 MG tablet Take 1 tablet by mouth every 6 (six) hours as needed for severe pain. 15 tablet 0  . promethazine-dextromethorphan (PROMETHAZINE-DM) 6.25-15 MG/5ML syrup Take 5 mLs by mouth 4 (four) times daily as needed for cough. 120 mL 0   No current facility-administered medications for this visit.    Allergies  Allergen Reactions  . Penicillins Shortness Of Breath and Swelling    Has patient had a PCN reaction causing immediate rash, facial/tongue/throat swelling, SOB or lightheadedness with hypotension: yes Has patient had a PCN reaction causing severe rash involving mucus membranes or skin necrosis: no Has patient had a PCN reaction that required hospitalization: unknown Has patient had a PCN reaction occurring within the last 10 years: no If all of the above answers are "NO", then may proceed with Cephalosporin use.      Review of Systems: All systems reviewed and negative except where noted in HPI.     Physical Exam:    Complete physical exam not completed due to the nature of this telehealth communication.   Gen: Awake, alert, and oriented, and well communicative. Pulm: No labored breathing, speaking in full sentences without conversational dyspnea Psych: Pleasant, cooperative, normal speech, thought processing seemingly intact   ASSESSMENT AND PLAN;   1) Elevated AST/ALT/Tbili: Unclear if elevated liver enzymes are because of GI symptoms or result or unrelated. Discussed the close relationship at length today, along with the broad DDX.  Given elevation back in 2018, plan for extended serologic eval.  He would like to do a limited/staged approach to his diagnostic work-up and will proceed as below:  -Check A1c - AMA, ANA, ASMA, A1AT, ferritin, iron panel, ceruloplasmin, LKM Ab  - Evaluate for Celiac with tTG  - Immunoglobulin level -Check TSH - To  follow-up with me in 3-6 months or sooner prn - If LAE's still elevated, plan for RUQ US  2) Nausea/Vomiting: -Offered EGD to evaluate for mucosal/luminal etiology, PUD, gastritis, GOO, etc.  He elected to hold off for now -Continue p.o. intake as tolerated - Offered antiemetics, but given absence of symptoms currently, he elected to hold off  3) Diarrhea: - GI PCR panel -Check TSH - Holding off on pancreatic fecal elastase for now -Offered EGD with duodenal biopsies and colonoscopy with bxs, which he declined  4) Increased thirst: -Check hemoglobin A1c and fasting blood glucose - Strongly recommended that he establish care with a PCM -Check UA   Shellia CleverlyVito V Kore Madlock, DO, FACG  11/19/2018, 8:25 AM   Marva PandaMillsaps, Kimberly, NP

## 2019-11-29 ENCOUNTER — Emergency Department (HOSPITAL_COMMUNITY): Payer: Managed Care, Other (non HMO)

## 2019-11-29 ENCOUNTER — Other Ambulatory Visit: Payer: Self-pay

## 2019-11-29 ENCOUNTER — Encounter (HOSPITAL_COMMUNITY): Payer: Self-pay

## 2019-11-29 ENCOUNTER — Emergency Department (HOSPITAL_COMMUNITY)
Admission: EM | Admit: 2019-11-29 | Discharge: 2019-11-29 | Disposition: A | Discharge status: Home / Self Care | Payer: Managed Care, Other (non HMO) | Attending: Emergency Medicine | Admitting: Emergency Medicine

## 2019-11-29 DIAGNOSIS — R55 Syncope and collapse: Secondary | ICD-10-CM

## 2019-11-29 DIAGNOSIS — F17290 Nicotine dependence, other tobacco product, uncomplicated: Secondary | ICD-10-CM | POA: Diagnosis not present

## 2019-11-29 LAB — COMPREHENSIVE METABOLIC PANEL
ALT: 82 U/L — ABNORMAL HIGH (ref 0–44)
AST: 228 U/L — ABNORMAL HIGH (ref 15–41)
Albumin: 3.7 g/dL (ref 3.5–5.0)
Alkaline Phosphatase: 115 U/L (ref 38–126)
Anion gap: 13 (ref 5–15)
BUN: 11 mg/dL (ref 6–20)
CO2: 24 mmol/L (ref 22–32)
Calcium: 8.2 mg/dL — ABNORMAL LOW (ref 8.9–10.3)
Chloride: 104 mmol/L (ref 98–111)
Creatinine, Ser: 0.9 mg/dL (ref 0.61–1.24)
GFR calc Af Amer: >60 mL/min (ref >60)
GFR calc non Af Amer: >60 mL/min (ref >60)
Glucose, Bld: 92 mg/dL (ref 70–99)
Potassium: 4.2 mmol/L (ref 3.5–5.1)
Sodium: 141 mmol/L (ref 135–145)
Total Bilirubin: 0.5 mg/dL (ref 0.3–1.2)
Total Protein: 8.1 g/dL (ref 6.5–8.1)

## 2019-11-29 LAB — CBC WITH DIFFERENTIAL/PLATELET
Abs Immature Granulocytes: 0.02 10*3/uL (ref 0.00–0.07)
Basophils Absolute: 0.1 10*3/uL (ref 0.0–0.1)
Basophils Relative: 1 %
Eosinophils Absolute: 0.1 10*3/uL (ref 0.0–0.5)
Eosinophils Relative: 1 %
HCT: 34.2 % — ABNORMAL LOW (ref 39.0–52.0)
Hemoglobin: 11.3 g/dL — ABNORMAL LOW (ref 13.0–17.0)
Immature Granulocytes: 0 %
Lymphocytes Relative: 35 %
Lymphs Abs: 1.8 10*3/uL (ref 0.7–4.0)
MCH: 34.1 pg — ABNORMAL HIGH (ref 26.0–34.0)
MCHC: 33 g/dL (ref 30.0–36.0)
MCV: 103.3 fL — ABNORMAL HIGH (ref 80.0–100.0)
Monocytes Absolute: 0.6 10*3/uL (ref 0.1–1.0)
Monocytes Relative: 12 %
Neutro Abs: 2.5 10*3/uL (ref 1.7–7.7)
Neutrophils Relative %: 51 %
Platelets: 188 10*3/uL (ref 150–400)
RBC: 3.31 MIL/uL — ABNORMAL LOW (ref 4.22–5.81)
RDW: 13.1 % (ref 11.5–15.5)
WBC: 5 10*3/uL (ref 4.0–10.5)
nRBC: 0 % (ref 0.0–0.2)

## 2019-11-29 LAB — CBG MONITORING, ED: Glucose-Capillary: 80 mg/dL (ref 70–99)

## 2019-11-29 LAB — ETHANOL: Alcohol, Ethyl (B): 275 mg/dL — ABNORMAL HIGH (ref <10)

## 2019-11-29 MED ORDER — THIAMINE HCL 100 MG PO TABS
100.0000 mg | ORAL_TABLET | Freq: Once | ORAL | Status: AC
  Administered 2019-11-29: 100 mg via ORAL
  Filled 2019-11-29: qty 1

## 2019-11-29 MED ORDER — FOLIC ACID 1 MG PO TABS
1.0000 mg | ORAL_TABLET | Freq: Once | ORAL | Status: AC
  Administered 2019-11-29: 1 mg via ORAL
  Filled 2019-11-29: qty 1

## 2019-11-29 MED ORDER — SODIUM CHLORIDE 0.9 % IV SOLN: INTRAVENOUS | Status: DC

## 2019-11-29 NOTE — ED Triage Notes (Signed)
Pt BIB EMS from home due to near syncopal episode. Pt reports he was talking to his neighbors and he became dizzy and fell forward onto the door. He states he slid to the ground. Pt reports he near had loc. Pt denies hitting head or on blood thinners. Per ems pt was diaphoretic on arrival .

## 2019-11-29 NOTE — ED Provider Notes (Addendum)
Patient care assumed at 1500.  Pt with hx/o EtOH abuse here for evaluation following near syncopal event.  Labs pending.     Labs with stable elevation in his transaminases. Hemoglobin with mild anemia. Patient denies any black or bloody stools. No recurrent symptoms during his ED stay. Plan to discharge home with outpatient follow-up and return precautions regarding his near syncopal event.  Tilden Fossa, MD 11/29/19 Ebony Cargo    Tilden Fossa, MD 11/29/19 [phone]

## 2019-11-29 NOTE — ED Provider Notes (Signed)
[facility] EMERGENCY DEPARTMENT Provider Note   CSN: [id number] Arrival date & time: 11/29/19  1358     History Chief Complaint  Patient presents with  . Near Syncope    Charles Aguilar is a 44 y.o. male.  HPI    Patient with a history of alcohol abuse presents with his mother after an episode of syncope. Patient self is awake and alert, sitting upright, speaking clearly. He notes that he has had several similar episodes in the recent past, typically occurring at work.  Today the patient recalls feeling lightheaded, flushed, when he had episode of witnessed syncope. Per report the patient was out for several moments, had no prolonged recovery.  Consistent with postictal phase. Currently the patient has no pain, notes that he had no pain before or after the episode either. Patient notes that he drinks about a pint of alcohol daily, denies history of known medical issues.  History reviewed. No pertinent past medical history.  There are no problems to display for this patient.   History reviewed. No pertinent surgical history.     Family History  Problem Relation Age of Onset  . Diabetes Mother   . Diabetes Father   . Colon cancer Neg Hx     Social History   Tobacco Use  . Smoking status: Current Every Day Smoker    Types: Cigarettes, Cigars  . Smokeless tobacco: Never Used  Vaping Use  . Vaping Use: Never used  Substance Use Topics  . Alcohol use: Yes    Comment: Occasionally  . Drug use: Never    Home Medications Prior to Admission medications   Medication Sig Start Date End Date Taking? Authorizing Provider  ibuprofen (ADVIL,MOTRIN) 200 MG tablet Take 800 mg by mouth every 6 (six) hours as needed for headache or mild pain.    Yes [provider]  multivitamin (ONE-A-DAY MEN'S) TABS tablet Take 1 tablet by mouth daily.   Yes [provider]  NON FORMULARY Take 1 tablet by mouth See admin instructions. Rhino  enhancement tablets- Take 1 tablet by mouth one hour prior to need   Yes [provider]  benzonatate (TESSALON) 100 MG capsule Take 1 capsule (100 mg total) by mouth every 8 (eight) hours. Patient not taking: Reported on 11/29/2019 05/01/17   Rolland Porter, MD  cyclobenzaprine (FLEXERIL) 10 MG tablet Take 1 tablet (10 mg total) by mouth 2 (two) times daily as needed for muscle spasms. Patient not taking: Reported on 11/29/2019 03/13/16   Tegeler, Canary Brim, MD  levofloxacin (LEVAQUIN) 500 MG tablet Take 1 tablet (500 mg total) by mouth daily. Patient not taking: Reported on 11/29/2019 05/01/17   Rolland Porter, MD  naproxen (NAPROSYN) 500 MG tablet Take 1 tablet (500 mg total) by mouth 2 (two) times daily. Patient not taking: Reported on 11/29/2019 05/01/17   Rolland Porter, MD  ondansetron (ZOFRAN ODT) 8 MG disintegrating tablet Take 1 tablet (8 mg total) by mouth every 8 (eight) hours as needed for nausea or vomiting. Patient not taking: Reported on 11/29/2019 10/28/18   Azalia Bilis, MD  oxyCODONE-acetaminophen (PERCOCET/ROXICET) 5-325 MG tablet Take 1 tablet by mouth every 6 (six) hours as needed for severe pain. Patient not taking: Reported on 11/29/2019 03/13/16   Tegeler, Canary Brim, MD  promethazine-dextromethorphan (PROMETHAZINE-DM) 6.25-15 MG/5ML syrup Take 5 mLs by mouth 4 (four) times daily as needed for cough. Patient not taking: Reported on 11/29/2019 05/01/17   Rolland Porter, MD    Allergies  Penicillins  Review of Systems   Review of Systems  Constitutional:       Per HPI, otherwise negative  HENT:       Per HPI, otherwise negative  Respiratory:       Per HPI, otherwise negative  Cardiovascular:       Per HPI, otherwise negative  Gastrointestinal: Negative for vomiting.  Endocrine:       Negative aside from HPI  Genitourinary:       Neg aside from HPI   Musculoskeletal:       Per HPI, otherwise negative  Skin: Negative.   Neurological: Negative for syncope.     Physical Exam Updated Vital Signs BP (!) 159/107   Pulse 79   Temp 98 F (36.7 C) (Oral)   Resp 16   SpO2 99%   Physical Exam Vitals and nursing note reviewed.  Constitutional:      General: He is not in acute distress.    Appearance: He is well-developed.  HENT:     Head: Normocephalic and atraumatic.  Eyes:     Conjunctiva/sclera: Conjunctivae normal.  Cardiovascular:     Rate and Rhythm: Normal rate and regular rhythm.  Pulmonary:     Effort: Pulmonary effort is normal. No respiratory distress.     Breath sounds: No stridor.  Abdominal:     General: There is no distension.  Skin:    General: Skin is warm and dry.  Neurological:     Mental Status: He is alert and oriented to person, place, and time.     ED Results / Procedures / Treatments   Labs (all labs ordered are listed, but only abnormal results are displayed) Labs Reviewed  COMPREHENSIVE METABOLIC PANEL - Abnormal; Notable for the following components:      Result Value   Calcium 8.2 (*)    AST 228 (*)    ALT 82 (*)    All other components within normal limits  CBC WITH DIFFERENTIAL/PLATELET - Abnormal; Notable for the following components:   RBC 3.31 (*)    Hemoglobin 11.3 (*)    HCT 34.2 (*)    MCV 103.3 (*)    MCH 34.1 (*)    All other components within normal limits  ETHANOL  CBG MONITORING, ED    EKG EKG Interpretation  Date/Time:  Monday November 29 2019 14:07:49 EDT Ventricular Rate:  68 PR Interval:    QRS Duration: 92 QT Interval:  448 QTC Calculation: 477 R Axis:   96 Text Interpretation: Sinus rhythm Short PR interval Left atrial enlargement Anteroseptal infarct, age indeterminate Artifact No significant change since last tracing Abnormal ECG Confirmed by Gerhard Munch [phone]) on 11/29/2019 2:38:26 PM   Radiology DG Chest Port 1 View  Result Date: 11/29/2019 CLINICAL DATA:  from home due to near syncopal episode. Pt reports he was talking to his neighbors and he became dizzy  and fell forward onto the door. He states he slid to the ground. Pt reports he near had loc EXAM: PORTABLE CHEST 1 VIEW COMPARISON:  Chest radiograph 05/01/2017 FINDINGS: The heart size and mediastinal contours are within normal limits. The lungs are clear. No pneumothorax. No right pleural effusion. No large left pleural effusion., the left costophrenic angle is excluded from field of view. No acute finding in the visualized skeleton. IMPRESSION: No acute cardiopulmonary process. Electronically Signed   By: Emmaline Kluver M.D.   On: 11/29/2019 14:40    Procedures Procedures (including critical care time)  Medications Ordered  in ED Medications  0.9 %  sodium chloride infusion ( Intravenous New Bag/Given 11/29/19 1449)    ED Course  I have reviewed the triage vital signs and the nursing notes.  Pertinent labs & imaging results that were available during my care of the patient were reviewed by me and considered in my medical decision making (see chart for details).    MDM Rules/Calculators/A&P   This adult male presents after episode of syncope.  This occurs in the context of daily alcohol use, and after several prior similar events. Some suspicion for the patient's alcohol use contributing to electrolyte abnormalities, little evidence initially for atypical ACS, though this remains a consideration, as does arrhythmia. Patient is awake and alert, moving all extremities spontaneously, low suspicion for stroke, similarly, low suspicion for seizure given his absence of postictal phase. On signout the patient was awaiting labs, CT, x-ray, ongoing fluid resuscitation and repeat evaluation. Dr. Madilyn Hook is aware of the patient. Final Clinical Impression(s) / ED Diagnoses Final diagnoses:  Syncope and collapse     Gerhard Munch, MD 11/29/19 1544

## 2020-07-06 ENCOUNTER — Inpatient Hospital Stay (HOSPITAL_COMMUNITY): Payer: Medicaid Other

## 2020-07-06 ENCOUNTER — Emergency Department (HOSPITAL_COMMUNITY): Payer: Medicaid Other

## 2020-07-06 ENCOUNTER — Other Ambulatory Visit: Payer: Self-pay

## 2020-07-06 ENCOUNTER — Encounter (HOSPITAL_COMMUNITY): Payer: Self-pay

## 2020-07-06 ENCOUNTER — Inpatient Hospital Stay (HOSPITAL_COMMUNITY)
Admission: EM | Admit: 2020-07-06 | Discharge: 2020-08-11 | DRG: 023 | Disposition: E | Payer: Medicaid Other | Attending: Pulmonary Disease | Admitting: Pulmonary Disease

## 2020-07-06 DIAGNOSIS — Z66 Do not resuscitate: Secondary | ICD-10-CM | POA: Diagnosis not present

## 2020-07-06 DIAGNOSIS — K5909 Other constipation: Secondary | ICD-10-CM | POA: Diagnosis not present

## 2020-07-06 DIAGNOSIS — I6389 Other cerebral infarction: Secondary | ICD-10-CM | POA: Diagnosis not present

## 2020-07-06 DIAGNOSIS — Z515 Encounter for palliative care: Secondary | ICD-10-CM | POA: Diagnosis not present

## 2020-07-06 DIAGNOSIS — J189 Pneumonia, unspecified organism: Secondary | ICD-10-CM

## 2020-07-06 DIAGNOSIS — G9341 Metabolic encephalopathy: Secondary | ICD-10-CM | POA: Diagnosis not present

## 2020-07-06 DIAGNOSIS — I468 Cardiac arrest due to other underlying condition: Secondary | ICD-10-CM | POA: Diagnosis not present

## 2020-07-06 DIAGNOSIS — D72829 Elevated white blood cell count, unspecified: Secondary | ICD-10-CM | POA: Diagnosis present

## 2020-07-06 DIAGNOSIS — E871 Hypo-osmolality and hyponatremia: Secondary | ICD-10-CM | POA: Diagnosis not present

## 2020-07-06 DIAGNOSIS — R509 Fever, unspecified: Secondary | ICD-10-CM | POA: Diagnosis not present

## 2020-07-06 DIAGNOSIS — I469 Cardiac arrest, cause unspecified: Secondary | ICD-10-CM

## 2020-07-06 DIAGNOSIS — Z01818 Encounter for other preprocedural examination: Secondary | ICD-10-CM

## 2020-07-06 DIAGNOSIS — E872 Acidosis: Secondary | ICD-10-CM | POA: Diagnosis not present

## 2020-07-06 DIAGNOSIS — N179 Acute kidney failure, unspecified: Secondary | ICD-10-CM

## 2020-07-06 DIAGNOSIS — F17210 Nicotine dependence, cigarettes, uncomplicated: Secondary | ICD-10-CM | POA: Diagnosis present

## 2020-07-06 DIAGNOSIS — E785 Hyperlipidemia, unspecified: Secondary | ICD-10-CM | POA: Diagnosis present

## 2020-07-06 DIAGNOSIS — Z978 Presence of other specified devices: Secondary | ICD-10-CM

## 2020-07-06 DIAGNOSIS — I161 Hypertensive emergency: Secondary | ICD-10-CM | POA: Diagnosis present

## 2020-07-06 DIAGNOSIS — D6489 Other specified anemias: Secondary | ICD-10-CM | POA: Diagnosis not present

## 2020-07-06 DIAGNOSIS — E7220 Disorder of urea cycle metabolism, unspecified: Secondary | ICD-10-CM | POA: Diagnosis present

## 2020-07-06 DIAGNOSIS — R34 Anuria and oliguria: Secondary | ICD-10-CM | POA: Diagnosis not present

## 2020-07-06 DIAGNOSIS — J9601 Acute respiratory failure with hypoxia: Secondary | ICD-10-CM | POA: Diagnosis present

## 2020-07-06 DIAGNOSIS — G931 Anoxic brain damage, not elsewhere classified: Secondary | ICD-10-CM | POA: Diagnosis not present

## 2020-07-06 DIAGNOSIS — Z452 Encounter for adjustment and management of vascular access device: Secondary | ICD-10-CM

## 2020-07-06 DIAGNOSIS — G919 Hydrocephalus, unspecified: Secondary | ICD-10-CM | POA: Diagnosis present

## 2020-07-06 DIAGNOSIS — Y908 Blood alcohol level of 240 mg/100 ml or more: Secondary | ICD-10-CM | POA: Diagnosis present

## 2020-07-06 DIAGNOSIS — F10239 Alcohol dependence with withdrawal, unspecified: Secondary | ICD-10-CM | POA: Diagnosis not present

## 2020-07-06 DIAGNOSIS — G9340 Encephalopathy, unspecified: Secondary | ICD-10-CM | POA: Diagnosis not present

## 2020-07-06 DIAGNOSIS — I610 Nontraumatic intracerebral hemorrhage in hemisphere, subcortical: Secondary | ICD-10-CM | POA: Diagnosis present

## 2020-07-06 DIAGNOSIS — R570 Cardiogenic shock: Secondary | ICD-10-CM | POA: Diagnosis not present

## 2020-07-06 DIAGNOSIS — N170 Acute kidney failure with tubular necrosis: Secondary | ICD-10-CM | POA: Diagnosis not present

## 2020-07-06 DIAGNOSIS — I619 Nontraumatic intracerebral hemorrhage, unspecified: Secondary | ICD-10-CM | POA: Diagnosis present

## 2020-07-06 DIAGNOSIS — R092 Respiratory arrest: Secondary | ICD-10-CM | POA: Diagnosis present

## 2020-07-06 DIAGNOSIS — Z20822 Contact with and (suspected) exposure to covid-19: Secondary | ICD-10-CM | POA: Diagnosis present

## 2020-07-06 DIAGNOSIS — G40909 Epilepsy, unspecified, not intractable, without status epilepticus: Secondary | ICD-10-CM | POA: Diagnosis present

## 2020-07-06 DIAGNOSIS — Z8673 Personal history of transient ischemic attack (TIA), and cerebral infarction without residual deficits: Secondary | ICD-10-CM

## 2020-07-06 DIAGNOSIS — E876 Hypokalemia: Secondary | ICD-10-CM | POA: Diagnosis not present

## 2020-07-06 DIAGNOSIS — Z4659 Encounter for fitting and adjustment of other gastrointestinal appliance and device: Secondary | ICD-10-CM

## 2020-07-06 DIAGNOSIS — I615 Nontraumatic intracerebral hemorrhage, intraventricular: Secondary | ICD-10-CM | POA: Diagnosis present

## 2020-07-06 DIAGNOSIS — R569 Unspecified convulsions: Secondary | ICD-10-CM | POA: Diagnosis not present

## 2020-07-06 DIAGNOSIS — Z880 Allergy status to penicillin: Secondary | ICD-10-CM

## 2020-07-06 DIAGNOSIS — T85690A Other mechanical complication of epidural and subdural infusion catheter, initial encounter: Secondary | ICD-10-CM | POA: Diagnosis not present

## 2020-07-06 DIAGNOSIS — I509 Heart failure, unspecified: Secondary | ICD-10-CM

## 2020-07-06 DIAGNOSIS — I10 Essential (primary) hypertension: Secondary | ICD-10-CM | POA: Diagnosis present

## 2020-07-06 DIAGNOSIS — Z833 Family history of diabetes mellitus: Secondary | ICD-10-CM

## 2020-07-06 DIAGNOSIS — K7689 Other specified diseases of liver: Secondary | ICD-10-CM | POA: Diagnosis not present

## 2020-07-06 DIAGNOSIS — R7989 Other specified abnormal findings of blood chemistry: Secondary | ICD-10-CM | POA: Diagnosis not present

## 2020-07-06 HISTORY — DX: Epilepsy, unspecified, not intractable, without status epilepticus: G40.909

## 2020-07-06 LAB — I-STAT VENOUS BLOOD GAS, ED
Acid-Base Excess: 1 mmol/L (ref 0.0–2.0)
Bicarbonate: 27.8 mmol/L (ref 20.0–28.0)
Calcium, Ion: 1.04 mmol/L — ABNORMAL LOW (ref 1.15–1.40)
HCT: 38 % — ABNORMAL LOW (ref 39.0–52.0)
Hemoglobin: 12.9 g/dL — ABNORMAL LOW (ref 13.0–17.0)
O2 Saturation: 57 %
Potassium: 3.5 mmol/L (ref 3.5–5.1)
Sodium: 142 mmol/L (ref 135–145)
TCO2: 29 mmol/L (ref 22–32)
pCO2, Ven: 52 mmHg (ref 44.0–60.0)
pH, Ven: 7.337 (ref 7.250–7.430)
pO2, Ven: 32 mmHg (ref 32.0–45.0)

## 2020-07-06 LAB — COMPREHENSIVE METABOLIC PANEL
ALT: 83 U/L — ABNORMAL HIGH (ref 0–44)
AST: 129 U/L — ABNORMAL HIGH (ref 15–41)
Albumin: 3.6 g/dL (ref 3.5–5.0)
Alkaline Phosphatase: 114 U/L (ref 38–126)
Anion gap: 16 — ABNORMAL HIGH (ref 5–15)
BUN: 6 mg/dL (ref 6–20)
CO2: 24 mmol/L (ref 22–32)
Calcium: 8.9 mg/dL (ref 8.9–10.3)
Chloride: 102 mmol/L (ref 98–111)
Creatinine, Ser: 0.7 mg/dL (ref 0.61–1.24)
GFR, Estimated: >60 mL/min (ref >60)
Glucose, Bld: 90 mg/dL (ref 70–99)
Potassium: 3.6 mmol/L (ref 3.5–5.1)
Sodium: 142 mmol/L (ref 135–145)
Total Bilirubin: 0.6 mg/dL (ref 0.3–1.2)
Total Protein: 8 g/dL (ref 6.5–8.1)

## 2020-07-06 LAB — CBC WITH DIFFERENTIAL/PLATELET
Abs Immature Granulocytes: 0.02 10*3/uL (ref 0.00–0.07)
Basophils Absolute: 0.1 10*3/uL (ref 0.0–0.1)
Basophils Relative: 1 %
Eosinophils Absolute: 0 10*3/uL (ref 0.0–0.5)
Eosinophils Relative: 0 %
HCT: 38.2 % — ABNORMAL LOW (ref 39.0–52.0)
Hemoglobin: 12.7 g/dL — ABNORMAL LOW (ref 13.0–17.0)
Immature Granulocytes: 0 %
Lymphocytes Relative: 29 %
Lymphs Abs: 1.7 10*3/uL (ref 0.7–4.0)
MCH: 34.9 pg — ABNORMAL HIGH (ref 26.0–34.0)
MCHC: 33.2 g/dL (ref 30.0–36.0)
MCV: 104.9 fL — ABNORMAL HIGH (ref 80.0–100.0)
Monocytes Absolute: 0.6 10*3/uL (ref 0.1–1.0)
Monocytes Relative: 11 %
Neutro Abs: 3.4 10*3/uL (ref 1.7–7.7)
Neutrophils Relative %: 59 %
Platelets: 212 10*3/uL (ref 150–400)
RBC: 3.64 MIL/uL — ABNORMAL LOW (ref 4.22–5.81)
RDW: 13.1 % (ref 11.5–15.5)
WBC: 5.9 10*3/uL (ref 4.0–10.5)
nRBC: 0 % (ref 0.0–0.2)

## 2020-07-06 LAB — RESP PANEL BY RT-PCR (FLU A&B, COVID) ARPGX2
Influenza A by PCR: NEGATIVE
Influenza B by PCR: NEGATIVE
SARS Coronavirus 2 by RT PCR: NEGATIVE

## 2020-07-06 LAB — AMMONIA: Ammonia: 59 umol/L — ABNORMAL HIGH (ref 9–35)

## 2020-07-06 LAB — I-STAT CHEM 8, ED
BUN: 7 mg/dL (ref 6–20)
Calcium, Ion: 0.81 mmol/L — CL (ref 1.15–1.40)
Chloride: 108 mmol/L (ref 98–111)
Creatinine, Ser: 0.9 mg/dL (ref 0.61–1.24)
Glucose, Bld: 88 mg/dL (ref 70–99)
HCT: 39 % (ref 39.0–52.0)
Hemoglobin: 13.3 g/dL (ref 13.0–17.0)
Potassium: 4.6 mmol/L (ref 3.5–5.1)
Sodium: 139 mmol/L (ref 135–145)
TCO2: 24 mmol/L (ref 22–32)

## 2020-07-06 LAB — TROPONIN I (HIGH SENSITIVITY)
Troponin I (High Sensitivity): 6 ng/L (ref <18)
Troponin I (High Sensitivity): 6 ng/L (ref <18)

## 2020-07-06 LAB — ETHANOL: Alcohol, Ethyl (B): 242 mg/dL — ABNORMAL HIGH (ref <10)

## 2020-07-06 LAB — SODIUM: Sodium: 138 mmol/L (ref 135–145)

## 2020-07-06 LAB — MAGNESIUM: Magnesium: 1.3 mg/dL — ABNORMAL LOW (ref 1.7–2.4)

## 2020-07-06 MED ORDER — SODIUM CHLORIDE 3 % IV BOLUS
250.0000 mL | Freq: Once | INTRAVENOUS | Status: AC
  Administered 2020-07-06: 250 mL via INTRAVENOUS
  Filled 2020-07-06: qty 500

## 2020-07-06 MED ORDER — STROKE: EARLY STAGES OF RECOVERY BOOK: Freq: Once | Status: AC

## 2020-07-06 MED ORDER — SODIUM CHLORIDE 3 % IV SOLN
INTRAVENOUS | Status: DC
  Filled 2020-07-06 (×3): qty 500

## 2020-07-06 MED ORDER — POLYETHYLENE GLYCOL 3350 17 G PO PACK
17.0000 g | PACK | Freq: Every day | ORAL | Status: DC
  Administered 2020-07-07: 17 g
  Filled 2020-07-06: qty 1

## 2020-07-06 MED ORDER — MIDAZOLAM HCL 2 MG/2ML IJ SOLN
INTRAMUSCULAR | Status: AC
  Administered 2020-07-06: 2 mg
  Filled 2020-07-06: qty 4

## 2020-07-06 MED ORDER — ACETAMINOPHEN 325 MG PO TABS
650.0000 mg | ORAL_TABLET | ORAL | Status: DC | PRN
  Administered 2020-07-09 – 2020-07-19 (×3): 650 mg via ORAL
  Filled 2020-07-06 (×3): qty 2

## 2020-07-06 MED ORDER — PROPOFOL 1000 MG/100ML IV EMUL
0.0000 ug/kg/min | INTRAVENOUS | Status: DC
  Administered 2020-07-07: 25 ug/kg/min via INTRAVENOUS

## 2020-07-06 MED ORDER — CLEVIDIPINE BUTYRATE 0.5 MG/ML IV EMUL
0.0000 mg/h | INTRAVENOUS | Status: DC
  Administered 2020-07-06: 16 mg/h via INTRAVENOUS
  Administered 2020-07-06: 1 mg/h via INTRAVENOUS
  Administered 2020-07-07: 8 mg/h via INTRAVENOUS
  Administered 2020-07-07 (×2): 6 mg/h via INTRAVENOUS
  Administered 2020-07-08: 2 mg/h via INTRAVENOUS
  Administered 2020-07-08: 4 mg/h via INTRAVENOUS
  Filled 2020-07-06 (×6): qty 50
  Filled 2020-07-06: qty 100

## 2020-07-06 MED ORDER — PROPOFOL 1000 MG/100ML IV EMUL
INTRAVENOUS | Status: AC
  Administered 2020-07-06: 20 ug/kg/min via INTRAVENOUS
  Filled 2020-07-06: qty 100

## 2020-07-06 MED ORDER — FENTANYL CITRATE (PF) 100 MCG/2ML IJ SOLN
50.0000 ug | INTRAMUSCULAR | Status: DC | PRN
  Administered 2020-07-07: 100 ug via INTRAVENOUS
  Filled 2020-07-06: qty 2

## 2020-07-06 MED ORDER — SODIUM CHLORIDE 0.9 % IV SOLN
1.0000 mg | Freq: Once | INTRAVENOUS | Status: AC
  Administered 2020-07-06: 1 mg via INTRAVENOUS
  Filled 2020-07-06: qty 0.2

## 2020-07-06 MED ORDER — LABETALOL HCL 5 MG/ML IV SOLN
10.0000 mg | INTRAVENOUS | Status: DC | PRN
  Administered 2020-07-06: 10 mg via INTRAVENOUS
  Filled 2020-07-06: qty 4

## 2020-07-06 MED ORDER — SENNOSIDES-DOCUSATE SODIUM 8.6-50 MG PO TABS: 1.0000 | ORAL_TABLET | Freq: Two times a day (BID) | ORAL | Status: DC

## 2020-07-06 MED ORDER — FENTANYL CITRATE (PF) 100 MCG/2ML IJ SOLN: 100.0000 ug | Freq: Once | INTRAMUSCULAR | Status: AC

## 2020-07-06 MED ORDER — ETOMIDATE 2 MG/ML IV SOLN
INTRAVENOUS | Status: AC
  Administered 2020-07-06: 20 mg
  Filled 2020-07-06: qty 20

## 2020-07-06 MED ORDER — FENTANYL CITRATE (PF) 100 MCG/2ML IJ SOLN
INTRAMUSCULAR | Status: AC
  Administered 2020-07-06: 100 ug
  Filled 2020-07-06: qty 2

## 2020-07-06 MED ORDER — DOCUSATE SODIUM 50 MG/5ML PO LIQD: 100.0000 mg | Freq: Two times a day (BID) | ORAL | Status: DC

## 2020-07-06 MED ORDER — SODIUM CHLORIDE 0.9 % IV BOLUS
1000.0000 mL | Freq: Once | INTRAVENOUS | Status: AC
  Administered 2020-07-06: 1000 mL via INTRAVENOUS

## 2020-07-06 MED ORDER — SODIUM CHLORIDE 0.9 % IV SOLN: INTRAVENOUS | Status: DC | PRN

## 2020-07-06 MED ORDER — THIAMINE HCL 100 MG/ML IJ SOLN
100.0000 mg | INTRAMUSCULAR | Status: AC
  Administered 2020-07-09 – 2020-07-11 (×3): 100 mg via INTRAVENOUS
  Filled 2020-07-06 (×3): qty 2

## 2020-07-06 MED ORDER — ROCURONIUM BROMIDE 10 MG/ML (PF) SYRINGE
PREFILLED_SYRINGE | INTRAVENOUS | Status: AC
  Administered 2020-07-06: 100 mg
  Filled 2020-07-06: qty 10

## 2020-07-06 MED ORDER — MIDAZOLAM HCL 2 MG/2ML IJ SOLN
2.0000 mg | INTRAMUSCULAR | Status: DC | PRN
  Administered 2020-07-07 – 2020-07-08 (×2): 2 mg via INTRAVENOUS
  Filled 2020-07-06 (×3): qty 2

## 2020-07-06 MED ORDER — ACETAMINOPHEN 160 MG/5ML PO SOLN
650.0000 mg | ORAL | Status: DC | PRN
  Administered 2020-07-08: 650 mg
  Filled 2020-07-06: qty 20.3

## 2020-07-06 MED ORDER — FENTANYL CITRATE (PF) 100 MCG/2ML IJ SOLN: 50.0000 ug | INTRAMUSCULAR | Status: DC | PRN

## 2020-07-06 MED ORDER — PANTOPRAZOLE SODIUM 40 MG IV SOLR
40.0000 mg | Freq: Every day | INTRAVENOUS | Status: DC
  Administered 2020-07-07: 40 mg via INTRAVENOUS
  Filled 2020-07-06: qty 40

## 2020-07-06 MED ORDER — MAGNESIUM SULFATE 2 GM/50ML IV SOLN
2.0000 g | Freq: Once | INTRAVENOUS | Status: AC
  Administered 2020-07-07: 2 g via INTRAVENOUS
  Filled 2020-07-06: qty 50

## 2020-07-06 MED ORDER — MIDAZOLAM HCL 2 MG/2ML IJ SOLN: 2.0000 mg | INTRAMUSCULAR | Status: DC | PRN

## 2020-07-06 MED ORDER — MIDAZOLAM HCL 2 MG/2ML IJ SOLN
4.0000 mg | Freq: Once | INTRAMUSCULAR | Status: AC
  Administered 2020-07-06: 2 mg via INTRAVENOUS

## 2020-07-06 MED ORDER — ACETAMINOPHEN 650 MG RE SUPP
650.0000 mg | RECTAL | Status: DC | PRN
  Administered 2020-07-12: 650 mg via RECTAL
  Filled 2020-07-06: qty 1

## 2020-07-06 MED ORDER — THIAMINE HCL 100 MG/ML IJ SOLN
500.0000 mg | Freq: Three times a day (TID) | INTRAVENOUS | Status: AC
  Administered 2020-07-06 – 2020-07-09 (×9): 500 mg via INTRAVENOUS
  Filled 2020-07-06 (×11): qty 5

## 2020-07-06 NOTE — Consult Note (Signed)
Reason for Consult: [facility] Referring Physician: Paris Lore, PA-C   HPI: Charles Aguilar is a 45 y.o. male with a past medical history of history of ETOH abuse, presented with headache, dizziness and progressive altered mental status. He was brought to the ER after falling out of his car. CT head scan in the ER showed acute parenchymal hemorrhage measuring 2.0 x 0.9 x 1.2cm within the right basal ganglia/thalamus with interventricular extension of the hemorrhage involving the right greater than left lateral, third and fourth ventricles. Patient's mentation continued to worsen int he ER per documentation but improved with adequate treatment of his hypertension with cleviprex. EVD was deferred initially due to improving exam. He was admitted to 4N ICU and was ultimately intubated due to worsening neurological exam and airway protection.    Past Medical History:  Diagnosis Date  . Epilepsy (HCC)     History reviewed. No pertinent surgical history.  Family History  Problem Relation Age of Onset  . Diabetes Mother   . Diabetes Father   . Colon cancer Neg Hx     Social History:  reports that he has been smoking cigarettes and cigars. He has never used smokeless tobacco. He reports current alcohol use. He reports that he does not use drugs.  Allergies:  Allergies  Allergen Reactions  . Penicillins Shortness Of Breath and Swelling    Has patient had a PCN reaction causing immediate rash, facial/tongue/throat swelling, SOB or lightheadedness with hypotension: yes Has patient had a PCN reaction causing severe rash involving mucus membranes or skin necrosis: no Has patient had a PCN reaction that required hospitalization: unknown Has patient had a PCN reaction occurring within the last 10 years: no If all of the above answers are "NO", then may proceed with Cephalosporin use.     Medications: I have reviewed the patient's current medications.  Results for orders placed or performed  during the hospital encounter of 06/14/2020 (from the past 48 hour(s))  CBC with Differential/Platelet     Status: Abnormal   Collection Time: 06/18/2020  7:00 PM  Result Value Ref Range   WBC 5.9 4.0 - 10.5 K/uL   RBC 3.64 (L) 4.22 - 5.81 MIL/uL   Hemoglobin 12.7 (L) 13.0 - 17.0 g/dL   HCT 79.8 (L) 92.1 - 19.4 %   MCV 104.9 (H) 80.0 - 100.0 fL   MCH 34.9 (H) 26.0 - 34.0 pg   MCHC 33.2 30.0 - 36.0 g/dL   RDW 17.4 08.1 - 44.8 %   Platelets 212 150 - 400 K/uL   nRBC 0.0 0.0 - 0.2 %   Neutrophils Relative % 59 %   Neutro Abs 3.4 1.7 - 7.7 K/uL   Lymphocytes Relative 29 %   Lymphs Abs 1.7 0.7 - 4.0 K/uL   Monocytes Relative 11 %   Monocytes Absolute 0.6 0.1 - 1.0 K/uL   Eosinophils Relative 0 %   Eosinophils Absolute 0.0 0.0 - 0.5 K/uL   Basophils Relative 1 %   Basophils Absolute 0.1 0.0 - 0.1 K/uL   Immature Granulocytes 0 %   Abs Immature Granulocytes 0.02 0.00 - 0.07 K/uL    Comment: Performed at [facility], [address] [place], [place] [address]  Comprehensive metabolic panel     Status: Abnormal   Collection Time: 06/17/2020  7:00 PM  Result Value Ref Range   Sodium 142 135 - 145 mmol/L   Potassium 3.6 3.5 - 5.1 mmol/L   Chloride 102 98 - 111  mmol/L   CO2 24 22 - 32 mmol/L   Glucose, Bld 90 70 - 99 mg/dL    Comment: Glucose reference range applies only to samples taken after fasting for at least 8 hours.   BUN 6 6 - 20 mg/dL   Creatinine, Ser 1.61 0.61 - 1.24 mg/dL   Calcium 8.9 8.9 - 09.6 mg/dL   Total Protein 8.0 6.5 - 8.1 g/dL   Albumin 3.6 3.5 - 5.0 g/dL   AST 045 (H) 15 - 41 U/L   ALT 83 (H) 0 - 44 U/L   Alkaline Phosphatase 114 38 - 126 U/L   Total Bilirubin 0.6 0.3 - 1.2 mg/dL   GFR, Estimated >40 >98 mL/min    Comment: (NOTE) Calculated using the CKD-EPI Creatinine Equation (2021)    Anion gap 16 (H) 5 - 15    Comment: Performed at [facility], [address], [place], [place] [address]  Troponin I (High Sensitivity)     Status: None    Collection Time: 07/02/2020  7:00 PM  Result Value Ref Range   Troponin I (High Sensitivity) 6 <18 ng/L    Comment: (NOTE) Elevated high sensitivity troponin I (hsTnI) values and significant  changes across serial measurements may suggest ACS but many other  chronic and acute conditions are known to elevate hsTnI results.  Refer to the "Links" section for chest pain algorithms and additional  guidance. Performed at [facility], [address] [place], [place] [address]   Magnesium     Status: Abnormal   Collection Time: 06/14/2020  7:00 PM  Result Value Ref Range   Magnesium 1.3 (L) 1.7 - 2.4 mg/dL    Comment: Performed at [facility], [address], [place], [place] [address]  Ethanol     Status: Abnormal   Collection Time: 07/08/2020  7:15 PM  Result Value Ref Range   Alcohol, Ethyl (B) 242 (H) <10 mg/dL    Comment: (NOTE) Lowest detectable limit for serum alcohol is 10 mg/dL.  For medical purposes only. Performed at [facility], [address] [place], [place] [address]   Ammonia     Status: Abnormal   Collection Time: 06/27/2020  7:33 PM  Result Value Ref Range   Ammonia 59 (H) 9 - 35 umol/L    Comment: Performed at [facility], [address], [place], [place] [address]  I-Stat venous blood gas, [facility] ED)     Status: Abnormal   Collection Time: 06/18/2020  7:46 PM  Result Value Ref Range   pH, Ven 7.337 7.250 - 7.430   pCO2, Ven 52.0 44.0 - 60.0 mmHg   pO2, Ven 32.0 32.0 - 45.0 mmHg   Bicarbonate 27.8 20.0 - 28.0 mmol/L   TCO2 29 22 - 32 mmol/L   O2 Saturation 57.0 %   Acid-Base Excess 1.0 0.0 - 2.0 mmol/L   Sodium 142 135 - 145 mmol/L   Potassium 3.5 3.5 - 5.1 mmol/L   Calcium, Ion 1.04 (L) 1.15 - 1.40 mmol/L   HCT 38.0 (L) 39.0 - 52.0 %   Hemoglobin 12.9 (L) 13.0 - 17.0 g/dL   Sample type VENOUS    Comment NOTIFIED PHYSICIAN   I-stat chem 8, ED (not at [facility] or [facility])     Status: Abnormal   Collection Time: 06/15/2020  8:07 PM  Result Value Ref  Range   Sodium 139 135 - 145 mmol/L   Potassium 4.6 3.5 - 5.1  mmol/L   Chloride 108 98 - 111 mmol/L   BUN 7 6 - 20 mg/dL   Creatinine, Ser 9.76 0.61 - 1.24 mg/dL   Glucose, Bld 88 70 - 99 mg/dL    Comment: Glucose reference range applies only to samples taken after fasting for at least 8 hours.   Calcium, Ion 0.81 (LL) 1.15 - 1.40 mmol/L   TCO2 24 22 - 32 mmol/L   Hemoglobin 13.3 13.0 - 17.0 g/dL   HCT 73.4 19.3 - 79.0 %   Comment NOTIFIED PHYSICIAN   Resp Panel by RT-PCR (Flu A&B, Covid) Nasopharyngeal Swab     Status: None   Collection Time: Jul 25, 2020  9:05 PM   Specimen: Nasopharyngeal Swab; Nasopharyngeal(NP) swabs in vial transport medium  Result Value Ref Range   SARS Coronavirus 2 by RT PCR NEGATIVE NEGATIVE    Comment: (NOTE) SARS-CoV-2 target nucleic acids are NOT DETECTED.  The SARS-CoV-2 RNA is generally detectable in upper respiratory specimens during the acute phase of infection. The lowest concentration of SARS-CoV-2 viral copies this assay can detect is 138 copies/mL. A negative result does not preclude SARS-Cov-2 infection and should not be used as the sole basis for treatment or other patient management decisions. A negative result may occur with  improper specimen collection/handling, submission of specimen other than nasopharyngeal swab, presence of viral mutation(s) within the areas targeted by this assay, and inadequate number of viral copies(<138 copies/mL). A negative result must be combined with clinical observations, patient history, and epidemiological information. The expected result is Negative.  Fact Sheet for Patients:  BloggerCourse.com  Fact Sheet for Healthcare Providers:  SeriousBroker.it  This test is no t yet approved or cleared by the [place] FDA and  has been authorized for detection and/or diagnosis of SARS-CoV-2 by FDA under an Emergency Use Authorization (EUA). This EUA will remain   in effect (meaning this test can be used) for the duration of the COVID-19 declaration under Section 564(b)(1) of the Act, 21 U.S.C.section 360bbb-3(b)(1), unless the authorization is terminated  or revoked sooner.       Influenza A by PCR NEGATIVE NEGATIVE   Influenza B by PCR NEGATIVE NEGATIVE    Comment: (NOTE) The Xpert Xpress SARS-CoV-2/FLU/RSV plus assay is intended as an aid in the diagnosis of influenza from Nasopharyngeal swab specimens and should not be used as a sole basis for treatment. Nasal washings and aspirates are unacceptable for Xpert Xpress SARS-CoV-2/FLU/RSV testing.  Fact Sheet for Patients: BloggerCourse.com  Fact Sheet for Healthcare Providers: SeriousBroker.it  This test is not yet approved or cleared by the [place] FDA and has been authorized for detection and/or diagnosis of SARS-CoV-2 by FDA under an Emergency Use Authorization (EUA). This EUA will remain in effect (meaning this test can be used) for the duration of the COVID-19 declaration under Section 564(b)(1) of the Act, 21 U.S.C. section 360bbb-3(b)(1), unless the authorization is terminated or revoked.  Performed at [facility], [address], [place], [place] [address]   Sodium     Status: None   Collection Time: July 25, 2020  9:05 PM  Result Value Ref Range   Sodium 138 135 - 145 mmol/L    Comment: Performed at [facility], [address] [place], [place] [address]  Troponin I (High Sensitivity)     Status: None   Collection Time: 07-25-20  9:05 PM  Result Value Ref Range   Troponin I (High Sensitivity) 6 <18 ng/L    Comment: (NOTE)  Elevated high sensitivity troponin I (hsTnI) values and significant  changes across serial measurements may suggest ACS but many other  chronic and acute conditions are known to elevate hsTnI results.  Refer to the "Links" section for chest pain algorithms and additional   guidance. Performed at [facility], [address] [place], [place] [id number]     CT Head Wo Contrast  Addendum Date: 11/09/2020   ADDENDUM REPORT: 006/30/2022 20:52 ADDENDUM: Upon further review there is new mild to moderate panventricular dilatation compared to prior exam. These results were called by telephone at the time of interpretation on 11/09/2020 at 8:47 pm to Dr. Iver NestleBhagat, who verbally acknowledged these results. Electronically Signed   By: Stana Buntinghikanele  Emekauwa M.D.   On: 006/30/2022 20:52   Result Date: 11/09/2020 CLINICAL DATA:  Syncope, simple, abnormal neuro exam EXAM: CT HEAD WITHOUT CONTRAST TECHNIQUE: Contiguous axial images were obtained from the base of the skull through the vertex without intravenous contrast. COMPARISON:  11/29/2019 and prior FINDINGS: Brain: Acute parenchymal hemorrhage measuring 2.0 x 0.9 x 1.2 cm centered within the right basal ganglia/thalamus. Intraventricular extension of hemorrhage involving the right greater than left lateral, third and fourth ventricles. Hemorrhage also opacifies the foramen of Luschka and upper cervical canal. No ventricular dilatation. No midline shift. No extra-axial fluid collection. No mass lesion. Vascular: No hyperdense vessel or unexpected calcification. Skull: Negative for fracture or focal lesion. Sinuses/Orbits: Normal orbits. Minimal ethmoid and maxillary sinus mucosal thickening. No mastoid effusion. Other: None. 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 No midline shift or ventriculomegaly. These results were called by telephone at the time of interpretation on 11/09/2020 at 7:39 pm to provider Chaney Mallingavid Yao, who verbally acknowledged these results. Electronically Signed: By: Stana Buntinghikanele  Emekauwa M.D. On: 006/30/2022 19:44   DG Chest Portable 1 View  Result Date: 11/09/2020 CLINICAL DATA:  Syncope. History of seizures. Mental status changes. EXAM: PORTABLE CHEST 1 VIEW  COMPARISON:  11/29/2019 FINDINGS: The Chin overlies the apices. Midline trachea. Normal heart size. No pleural effusion or pneumothorax. Clear lungs. IMPRESSION: No acute cardiopulmonary disease. Electronically Signed   By: Jeronimo GreavesKyle  Talbot M.D.   On: 006/30/2022 19:34   ROS Per HPI Blood pressure (!) 167/113, pulse (!) 114, temperature (!) 97.4 F (36.3 C), temperature source Oral, resp. rate 19, SpO2 91 %.    Physical Exam:  Neuro: Patient is drowsy but awakes with minimal stimulation. He is disoriented. Left-sided neglect.    Cranial Nerves: II: Left hemianopia III,IV, VI: ptosis not present, extra-ocular motions intact bilaterally, PERRLA V,VII: smile symmetric, facial light touch sensation normal bilaterally VIII: hearing intact bilaterally IX,X: uvula rises symmetrically XI: bilateral shoulder shrug XII: midline tongue extension  Motor: Right :  Upper extremity   4/5                                      Left:     Upper extremity   3/5             Lower extremity   4/5                                                  Lower extremity   2/5  Tone and bulk: normal tone throughout;  no atrophy noted  Sensory: Decreased sensation on the left  Plantars: Right: downgoing                                Left: downgoing  Assessment/Plan: 45 y.o. 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 Neuro examination initially improved with treatment of his hypertension and an EVD was not warranted. However, he began to decline and was intubated for airway protection. Revaluation led to decision to proceed with EVD placement. Maintain EVD at 10 Cm H20. Frequent neuro checks. Maintain SBP < 140.  Council Mechanic, DNP, NP-C 2020-07-27, 11:12 PM

## 2020-07-06 NOTE — Consult Note (Signed)
NAME:  Charles Aguilar, MRN:  [id number], DOB:  March 25, 1976, LOS: 0 ADMISSION DATE:  07-16-20, CONSULTATION DATE:  2/24 REFERRING MD:  Dr. Iver Nestle, CHIEF COMPLAINT:  ICH   Brief History:  45 year old male presenting with headache and AMS found to have ICH and hypertensive emergency.   History of Present Illness:  45 year old male with PMH as below, which is significant for alcoholism. He reportedly drinks vodka continuously. Family does not have an estimate on actual volume. He was in his usual state of health until 2/24 when he arrived to pick his brother up from work while complaining of a headache and dizziness. His brother drove home and the patient slept. Upon arriving home, the patient began to vomit and fell out of the vehicle striking the right side of his face. It was at this point his brother called EMS who transported the patient to [facility] ED. CT scan was done in the ED and demonstrated R ICH with IVF and ventricular dilation. Neurosurgery and neurology consulted. The patient was started on clevidipine for blood pressure management and was scheduled for CT angiogram of the head. PCCM was asked to admit due to poor mentation and concern for poor airway protection.    Past Medical History:   has a past medical history of Epilepsy (HCC).   Significant Hospital Events:  2/24 admit for ICH  Consults:  Neurology Neurosurgery  Procedures:    Significant Diagnostic Tests:  CT head 2/24 > 2.0 cm right basal ganglia/thalamic hemorrhage with intraventricular Extension. Upon further review there is new mild to moderate panventricular dilatation compared to prior exam. CTA head 2/24 >>>  Micro Data:    Antimicrobials:    Interim History / Subjective:    Objective   Blood pressure (!) 147/112, pulse 99, temperature (!) 97.4 F (36.3 C), temperature source Oral, resp. rate 15, SpO2 99 %.        Intake/Output Summary (Last 24 hours) at 2020/07/16 2156 Last data filed at  07/16/20 2142 Gross per 24 hour  Intake 250.94 ml  Output --  Net 250.94 ml   There were no vitals filed for this visit.  Examination: General: middle aged male resting comfortably in bed HENT: /AT, PERRL, no JVD Lungs: Clear bilateral breath sounds Cardiovascular: RRR, no MRG Abdomen: Soft, non-tender, non-distended Extremities: No acute deformity Neuro: Somnolent. Briefly arouses to painful stimuli.   Resolved Hospital Problem list     Assessment & Plan:   Intracranial hemorrhage: R basal ganglia/thalamic.  - CTA pending - neurology, neurosurgery following.  - Hypertonic saline per neurology - Serial sodium check - Frequent neuro checks - clevidipine infusion to keep SBP < - Hypertonic saline per neurology . No CVL required at current infusion rate - place arterial line for BP monitoring  Acute hypoxemic respiratory failure due to poor mentation and severe aspiration risk - STAT intubation - Full vent support - CXR, ABG - VAP bunde  Hypertensive crisis - Clevidipine as above.  - Holding PO antihypertensives while NPO - ICU hemodynamic monitoring.    Seizure history: no home medications documented.  - seizure precautions  Hypomagnesemia - give 2 grams mag  Alcohol abuse: Alcohol level 242 on admission - folic acid - thiamine - MVI - No current need for CIWA - observe for signs of withdrawal  Transaminitis: likely secondary to alcohol Hyperammonemia - Give lactulose once taking PO or has enteral access   Best practice (evaluated daily)  Diet: NPO Pain/Anxiety/Delirium protocol (  if indicated): NA VAP protocol (if indicated): NA DVT prophylaxis: SCD GI prophylaxis: PPI Glucose control: NA Mobility: BR Disposition:ICU  Goals of Care:  Last date of multidisciplinary goals of care discussion: Family and staff present:  Summary of discussion:  Follow up goals of care discussion due: 3/3 Code Status: FULL  Labs   CBC: Recent Labs   Lab Jul 30, 2020 1900 Jul 30, 2020 1946 Jul 30, 2020 2007  WBC 5.9  --   --   NEUTROABS 3.4  --   --   HGB 12.7* 12.9* 13.3  HCT 38.2* 38.0* 39.0  MCV 104.9*  --   --   PLT 212  --   --     Basic Metabolic Panel: Recent Labs  Lab Jul 30, 2020 1900 30-Jul-2020 1946 Jul 30, 2020 2007  NA 142 142 139  K 3.6 3.5 4.6  CL 102  --  108  CO2 24  --   --   GLUCOSE 90  --  88  BUN 6  --  7  CREATININE 0.70  --  0.90  CALCIUM 8.9  --   --   MG 1.3*  --   --    GFR: CrCl cannot be calculated (Unknown ideal weight.). Recent Labs  Lab July 30, 2020 1900  WBC 5.9    Liver Function Tests: Recent Labs  Lab 07/30/20 1900  AST 129*  ALT 83*  ALKPHOS 114  BILITOT 0.6  PROT 8.0  ALBUMIN 3.6   No results for input(s): LIPASE, AMYLASE in the last 168 hours. Recent Labs  Lab 07/30/20 1933  AMMONIA 59*    ABG    Component Value Date/Time   HCO3 27.8 07-30-20 1946   TCO2 24 07-30-20 2007   O2SAT 57.0 07/30/20 1946     Coagulation Profile: No results for input(s): INR, PROTIME in the last 168 hours.  Cardiac Enzymes: No results for input(s): CKTOTAL, CKMB, CKMBINDEX, TROPONINI in the last 168 hours.  HbA1C: No results found for: HGBA1C  CBG: No results for input(s): GLUCAP in the last 168 hours.  Review of Systems:   Patient is encephalopathic and/or intubated. Therefore history has been obtained from chart review.   Past Medical History:  He,  has a past medical history of Epilepsy (HCC).   Surgical History:  History reviewed. No pertinent surgical history.   Social History:   reports that he has been smoking cigarettes and cigars. He has never used smokeless tobacco. He reports current alcohol use. He reports that he does not use drugs.   Family History:  His family history includes Diabetes in his father and mother. There is no history of Colon cancer.   Allergies Allergies  Allergen Reactions  . Penicillins Shortness Of Breath and Swelling    Has patient had a PCN  reaction causing immediate rash, facial/tongue/throat swelling, SOB or lightheadedness with hypotension: yes Has patient had a PCN reaction causing severe rash involving mucus membranes or skin necrosis: no Has patient had a PCN reaction that required hospitalization: unknown Has patient had a PCN reaction occurring within the last 10 years: no If all of the above answers are "NO", then may proceed with Cephalosporin use.      Home Medications  Prior to Admission medications   Not on File     Critical care time: 40 minutes     Joneen Roach, AGACNP-BC [facility]  See Amion for personal pager PCCM on call pager [phone] until 7pm. Please call Elink 7p-7a. [phone]  07-30-2020 10:52 PM
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
## 2020-07-06 NOTE — ED Notes (Signed)
Assumed care of this patient. Vitals taken. Per brother patient had fall earlier today and became less responsive. Pt responsive to painful stimuli. Bilateral PIVs obtained. Medicated per MAR. Respirations regular/unlabored. Connected to cardiac monitor, bp, pulse ox. Stretcher low, wheels locked, call bell within reach. Brother at bedside.

## 2020-07-06 NOTE — ED Notes (Signed)
Patient unable to perform orthostatic vital signs at this time.

## 2020-07-06 NOTE — ED Provider Notes (Signed)
[facility] EMERGENCY DEPARTMENT Provider Note   CSN: [id number] Arrival date & time: 06/18/2020  1841     History Chief Complaint  Patient presents with  . Altered Mental Status  . Loss of Consciousness    Charles Aguilar is a 45 y.o. male who presents via EMS for concern for multiple syncopal episodes.  Patient is very difficult to arouse in the room, noncontributory to his history.  Level 5 caveat secondary to patient's altered mental status.  Patient's brother Jamar presented to the emergency department was able to provide collateral history.  He states that the patient picked him up from work today, and was driving without issue, when he informed his brother that he had a severe posterior right-sided headache and was feeling very tired.  At that time the patient and his brother switched so that Jamar was driving and the patient immediately fell asleep.  Upon arrival to their home, the patient was able to be aroused by his brother, however he immediately started to vomit and immediately passed out when standing up out of the vehicle. His brother states that the patient fell flat on his face on the grass, without attempt to catch himself.  He states that the patient became minimally arousable after that, however syncopized again after EMS arrived.  He states that his brother has history of alcohol abuse and syncope in the past secondary to his history, restates he has been doing much better this year.  Jamar states he spoke to the patient's girlfriend who stated that he has been sleeping a lot the last few days, however they are unable to identify when this patient's headache started.  HPI     Past Medical History:  Diagnosis Date  . Epilepsy [facility])     Patient Active Problem List   Diagnosis Date Noted  . ICH (intracerebral hemorrhage) (HCC) 06/24/2020    History reviewed. No pertinent surgical history.     Family History  Problem Relation Age of Onset   . Diabetes Mother   . Diabetes Father   . Colon cancer Neg Hx     Social History   Tobacco Use  . Smoking status: Current Every Day Smoker    Types: Cigarettes, Cigars  . Smokeless tobacco: Never Used  Vaping Use  . Vaping Use: Never used  Substance Use Topics  . Alcohol use: Yes    Comment: Occasionally  . Drug use: Never    Home Medications Prior to Admission medications   Not on File    Allergies    Penicillins  Review of Systems   Review of Systems  Unable to perform ROS: Mental status change    Physical Exam Updated Vital Signs BP (!) 144/95 (BP Location: Right Arm)   Pulse 97   Temp (!) 97.4 F (36.3 C) (Oral)   Resp 14   SpO2 100%   Physical Exam Vitals and nursing note reviewed.  HENT:     Head: Normocephalic and atraumatic.     Nose: Nose normal.     Mouth/Throat:     Mouth: Mucous membranes are moist.     Pharynx: Oropharynx is clear. Uvula midline. No oropharyngeal exudate or posterior oropharyngeal erythema.  Eyes:     General: Lids are normal.        Right eye: No discharge.        Left eye: No discharge.     Conjunctiva/sclera: Conjunctivae normal.     Pupils: Pupils are equal, round, and reactive  to light.  Neck:     Trachea: Trachea and phonation normal.  Cardiovascular:     Rate and Rhythm: Normal rate and regular rhythm.     Pulses: Normal pulses.          Radial pulses are 2+ on the right side and 2+ on the left side.       Dorsalis pedis pulses are 2+ on the right side.     Heart sounds: Normal heart sounds. No murmur heard.   Pulmonary:     Effort: Pulmonary effort is normal. No respiratory distress.     Breath sounds: Examination of the right-lower field reveals rhonchi. Examination of the left-lower field reveals rhonchi. Rhonchi present. No wheezing or rales.  Chest:     Chest wall: No lacerations, deformity, swelling, tenderness, crepitus or edema.  Abdominal:     General: Bowel sounds are normal. There is no  distension.     Tenderness: There is no abdominal tenderness.  Musculoskeletal:        General: No deformity.     Cervical back: Full passive range of motion without pain, normal range of motion and neck supple. No crepitus. No pain with movement, spinous process tenderness or muscular tenderness.     Right lower leg: No edema.     Left lower leg: No edema.  Lymphadenopathy:     Cervical: No cervical adenopathy.  Skin:    General: Skin is warm and dry.     Capillary Refill: Capillary refill takes less than 2 seconds.  Neurological:     Mental Status: He is lethargic, disoriented and confused.     Comments: Patient initially very hard to arouse, however he did wake up after sternal rub and was able to provide me with his full name tell me he was in the hospital.  He said he does not have any pain at this time, however he was very somnolent and was disoriented to time.  He believes it is November.    Psychiatric:        Mood and Affect: Mood normal.     ED Results / Procedures / Treatments   Labs (all labs ordered are listed, but only abnormal results are displayed) Labs Reviewed  CBC WITH DIFFERENTIAL/PLATELET - Abnormal; Notable for the following components:      Result Value   RBC 3.64 (*)    Hemoglobin 12.7 (*)    HCT 38.2 (*)    MCV 104.9 (*)    MCH 34.9 (*)    All other components within normal limits  COMPREHENSIVE METABOLIC PANEL - Abnormal; Notable for the following components:   AST 129 (*)    ALT 83 (*)    Anion gap 16 (*)    All other components within normal limits  MAGNESIUM - Abnormal; Notable for the following components:   Magnesium 1.3 (*)    All other components within normal limits  ETHANOL - Abnormal; Notable for the following components:   Alcohol, Ethyl (B) 242 (*)    All other components within normal limits  AMMONIA - Abnormal; Notable for the following components:   Ammonia 59 (*)    All other components within normal limits  I-STAT VENOUS BLOOD  GAS, ED - Abnormal; Notable for the following components:   Calcium, Ion 1.04 (*)    HCT 38.0 (*)    Hemoglobin 12.9 (*)    All other components within normal limits  I-STAT CHEM 8, ED - Abnormal; Notable for the following  components:   Calcium, Ion 0.81 (*)    All other components within normal limits  RESP PANEL BY RT-PCR (FLU A&B, COVID) ARPGX2  URINALYSIS, ROUTINE W REFLEX MICROSCOPIC  RAPID URINE DRUG SCREEN, HOSP PERFORMED  SODIUM  SODIUM  SODIUM  TROPONIN I (HIGH SENSITIVITY)  TROPONIN I (HIGH SENSITIVITY)    EKG EKG Interpretation  Date/Time:  Thursday July 06 2020 18:57:44 EST Ventricular Rate:  70 PR Interval:    QRS Duration: 94 QT Interval:  451 QTC Calculation: 487 R Axis:   102 Text Interpretation: Sinus rhythm Short PR interval Probable lateral infarct, old Probable anteroseptal infarct, old No significant change since last tracing Confirmed by Richardean CanalYao, David H [phone]([id number]) on 2020-12-30 6:59:10 PM   Radiology CT Head Wo Contrast  Addendum Date: 2020-12-30   ADDENDUM REPORT: 02022-08-20 20:52 ADDENDUM: Upon further review there is new mild to moderate panventricular dilatation compared to prior exam. These results were called by telephone at the time of interpretation on 2020-12-30 at 8:47 pm to Dr. Iver NestleBhagat, who verbally acknowledged these results. Electronically Signed   By: Stana Buntinghikanele  Emekauwa M.D.   On: 02022-08-20 20:52   Result Date: 2020-12-30 CLINICAL DATA:  Syncope, simple, abnormal neuro exam EXAM: CT HEAD WITHOUT CONTRAST TECHNIQUE: Contiguous axial images were obtained from the base of the skull through the vertex without intravenous contrast. COMPARISON:  11/29/2019 and prior FINDINGS: Brain: Acute parenchymal hemorrhage measuring 2.0 x 0.9 x 1.2 cm centered within the right basal ganglia/thalamus. Intraventricular extension of hemorrhage involving the right greater than left lateral, third and fourth ventricles. Hemorrhage also opacifies the foramen of Luschka  and upper cervical canal. No ventricular dilatation. No midline shift. No extra-axial fluid collection. No mass lesion. Vascular: No hyperdense vessel or unexpected calcification. Skull: Negative for fracture or focal lesion. Sinuses/Orbits: Normal orbits. Minimal ethmoid and maxillary sinus mucosal thickening. No mastoid effusion. Other: None. 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 No midline shift or ventriculomegaly. These results were called by telephone at the time of interpretation on 2020-12-30 at 7:39 pm to provider Chaney Mallingavid Yao, who verbally acknowledged these results. Electronically Signed: By: Stana Buntinghikanele  Emekauwa M.D. On: 02022-08-20 19:44   DG Chest Portable 1 View  Result Date: 2020-12-30 CLINICAL DATA:  Syncope. History of seizures. Mental status changes. EXAM: PORTABLE CHEST 1 VIEW COMPARISON:  11/29/2019 FINDINGS: The Chin overlies the apices. Midline trachea. Normal heart size. No pleural effusion or pneumothorax. Clear lungs. IMPRESSION: No acute cardiopulmonary disease. Electronically Signed   By: Jeronimo GreavesKyle  Talbot M.D.   On: 02022-08-20 19:34    Procedures .Critical Care Performed by: Paris LoreSponseller, Candee Hoon R, PA-C Authorized by: Paris LoreSponseller, Herlinda Heady R, PA-C   Critical care provider statement:    Critical care time (minutes):  45   Critical care was necessary to treat or prevent imminent or life-threatening deterioration of the following conditions: thalamic hermorrhage with abnormal neurologic exam.   Critical care was time spent personally by me on the following activities:  Discussions with consultants, evaluation of patient's response to treatment, examination of patient, ordering and performing treatments and interventions, ordering and review of laboratory studies, ordering and review of radiographic studies, pulse oximetry, re-evaluation of patient's condition, obtaining history from patient or surrogate and review of old charts      Medications Ordered in ED Medications  clevidipine (CLEVIPREX) infusion 0.5 mg/mL (4 mg/hr Intravenous Rate/Dose Change 08-Jul-2020 2017)  thiamine 500mg  in normal saline (50ml) IVPB (has no administration in time range)    Followed by  thiamine (B-1) injection 100 mg (has no administration in time range)  sodium chloride 3% (hypertonic) IV bolus 250 mL (250 mLs Intravenous New Bag/Given Aug 04, 2020 2109)  sodium chloride (hypertonic) 3 % solution (has no administration in time range)  labetalol (NORMODYNE) injection 10 mg (has no administration in time range)  sodium chloride 0.9 % bolus 1,000 mL (1,000 mLs Intravenous New Bag/Given 2020/08/04 2112)    ED Course  I have reviewed the triage vital signs and the nursing notes.  Pertinent labs & imaging results that were available during my care of the patient were reviewed by me and considered in my medical decision making (see chart for details).  Clinical Course as of 08/04/20 2138  Thu 2020-08-04  1933 Comprehensive metabolic panel [RS]  1946 I personally reviewed the patient's CT head image, which is significantly concerning for brain bleed.  I returned to the bedside with my attending physician, at which time the patient is no longer arousable.  He is somnolent, will follow command to hold right leg in the area, but does not do that with the left side.  He does have muscle tone, however [RS]  2005 Attending physician consulted with neurology attending who is agreeable to seeing this patient in the emergency department.  Consult to neurosurgery paged. [RS]  2010 I received call back from neurology NP, Benita Gutter, who states that as there is no hydrocephalus, this patient does not require an emergent shunt, and he does not feel neurosurgical evaluation is warranted in the ED at this time.  When I expressed our concern for this patient and our preference for him to be evaluated by neurosurgery in the ED tonight, he said we are free to pass  along his contact information to attending neurologist on the case.  I discussed this with attending neurologist, Dr. Iver Nestle, who is at the bedside at this time evaluating the patient.  She strongly disagrees and is requesting that neurosurgery present to the emergency department.  I provided her with the neurosurgery NP's contact information, and she was agreeable to proceeding with a second consult. [RS]  2025 Plan at this time is for patient to be admitted to critical care.  Patient is currently on Cleviprex, and has been started on hypertonic saline per neurology orders. [RS]    Clinical Course User Index [RS] Trisha Ken, Idelia Salm   MDM Rules/Calculators/A&P                         45 year old male who presents to the emergency department via EMS for concern regarding altered mental status after severe headache, vomiting, and syncopal episode.  The differential diagnosis for AMS is extensive and includes, but is not limited to:  Marland Kitchen Drug overdose - opioids, alcohol, sedatives, antipsychotics, drug withdrawal, others . Metabolic: hypoxia, hypoglycemia, hyperglycemia, hypercalcemia, hypernatremia, hyponatremia, uremia, hepatic encephalopathy, hypothyroidism, hyperthyroidism, vitamin B12 or thiamine deficiency, carbon monoxide poisoning, Wilson's disease, Lactic acidosis, DKA/HHOS . Infectious: meningitis, encephalitis, bacteremia/sepsis, urinary tract infection, pneumonia, neurosyphilis . Structural: Space-occupying lesion, (brain tumor, subdural hematoma, hydrocephalus,) . Vascular: stroke, subarachnoid hemorrhage, coronary ischemia, hypertensive encephalopathy, CNS vasculitis, thrombotic thrombocytopenic purpura, disseminated intravascular coagulation, hyperviscosity . Psychiatric: Schizophrenia, depression; Other: Seizure, hypothermia, heat stroke, ICU psychosis, dementia -"sundowning."  Hypertensive on intake to 170 systolic.  Cardiac exam benign, pulmonary exam with coarse breath sounds  bilaterally.  Abdominal exam is benign.  Thorough neurologic exam is unable to be performed as patient is very minimally responsive even  to painful stimuli,, patient was noted to have unilateral decrease in muscle tone and reaction to painful stimuli on the left side compared to the right.  His mother and brother at the bedside.  Broad work-up ordered including noncontrast head CT.  Noncontrast CT visualized by this provider and attending physician, with significant concern for head bleed, likely hypertensive bleed in this patient with pressures in the 160s/100s, who is not on any antihypertensive medication at home. Critical result was called to attending physician while I was at the bedside.  Concern for right-sided thalamic/basal ganglia hemorrhage extending into the ventricles, with significantly altered neurologic exam was performed by neurologist.  Please see ED course above for further detail for this patient's stay in the emergency department.  At this time patient remains stable, is under the care of neurologist who is planning to consult critical care.  The patient's brother and mother at the bedside. They voiced understanding of his medical evaluation and treatment plan.  Each of their questions has been answered to his expressed satisfaction.  This chart was dictated using voice recognition software, Dragon. Despite the best efforts of this provider to proofread and correct errors, errors may still occur which can change documentation meaning.  Final Clinical Impression(s) / ED Diagnoses Final diagnoses:  Thalamic hemorrhage (HCC)  Intraventricular hemorrhage [facility])    Rx / DC Orders ED Discharge Orders    None       Sherrilee Gilles 06/19/2020 2138    Charlynne Pander, MD 07/09/2020 2202

## 2020-07-06 NOTE — Progress Notes (Signed)
eLink Physician-Brief Progress Note Patient Name: Charles Aguilar DOB: 03/29/1976 MRN: [id number]   Date of Service  2020/07/29  HPI/Events of Note  Patient admitted with a right basal ganglia / thalamic hypertensive hemorrhage with intra-ventricular extension, and altered mental status, he is protecting his airway at the  Moment. He is on a Cardene infusion for BP control, and is to be followed by neurology and neurosurgery.  eICU Interventions  New Patient Evaluation completed.        Thomasene Lot Edina Winningham 2020-07-29, 10:30 PM

## 2020-07-06 NOTE — Procedures (Signed)
Intubation Procedure Note  Charles Aguilar  [id number]  [birth year]  Date:08-05-2020  Time:11:35 PM   Provider Performing:Ezrie Bunyan W Mikey Bussing    Procedure: Intubation (31500)  Indication(s) Respiratory Failure  Consent Risks of the procedure as well as the alternatives and risks of each were explained to the patient and/or caregiver.  Consent for the procedure was obtained and is signed in the bedside chart   Anesthesia Etomidate, Versed, Fentanyl and Rocuronium   Time Out Verified patient identification, verified procedure, site/side was marked, verified correct patient position, special equipment/implants available, medications/allergies/relevant history reviewed, required imaging and test results available.   Sterile Technique Usual hand hygeine, masks, and gloves were used   Procedure Description Patient positioned in bed supine.  Sedation given as noted above.  Patient was intubated with endotracheal tube using Glidescope.  View was Grade 1 full glottis .  Number of attempts was 1.  Colorimetric CO2 detector was consistent with tracheal placement.   Complications/Tolerance None; patient tolerated the procedure well. Chest X-ray is ordered to verify placement.   EBL Minimal   Specimen(s) None   Joneen Roach, AGACNP-BC [facility]  See Amion for personal pager PCCM on call pager [phone] until 7pm. Please call Elink 7p-7a. [phone]  08-05-2020 11:36 PM

## 2020-07-07 ENCOUNTER — Inpatient Hospital Stay (HOSPITAL_COMMUNITY): Payer: Medicaid Other

## 2020-07-07 DIAGNOSIS — J9601 Acute respiratory failure with hypoxia: Secondary | ICD-10-CM

## 2020-07-07 DIAGNOSIS — I6389 Other cerebral infarction: Secondary | ICD-10-CM

## 2020-07-07 LAB — CBC
HCT: 32.4 % — ABNORMAL LOW (ref 39.0–52.0)
Hemoglobin: 11.3 g/dL — ABNORMAL LOW (ref 13.0–17.0)
MCH: 35.6 pg — ABNORMAL HIGH (ref 26.0–34.0)
MCHC: 34.9 g/dL (ref 30.0–36.0)
MCV: 102.2 fL — ABNORMAL HIGH (ref 80.0–100.0)
Platelets: 242 10*3/uL (ref 150–400)
RBC: 3.17 MIL/uL — ABNORMAL LOW (ref 4.22–5.81)
RDW: 13.3 % (ref 11.5–15.5)
WBC: 8.1 10*3/uL (ref 4.0–10.5)
nRBC: 0 % (ref 0.0–0.2)

## 2020-07-07 LAB — BASIC METABOLIC PANEL
Anion gap: 20 — ABNORMAL HIGH (ref 5–15)
BUN: 5 mg/dL — ABNORMAL LOW (ref 6–20)
CO2: 19 mmol/L — ABNORMAL LOW (ref 22–32)
Calcium: 8.2 mg/dL — ABNORMAL LOW (ref 8.9–10.3)
Chloride: 106 mmol/L (ref 98–111)
Creatinine, Ser: 0.76 mg/dL (ref 0.61–1.24)
GFR, Estimated: >60 mL/min (ref >60)
Glucose, Bld: 95 mg/dL (ref 70–99)
Potassium: 3.1 mmol/L — ABNORMAL LOW (ref 3.5–5.1)
Sodium: 145 mmol/L (ref 135–145)

## 2020-07-07 LAB — POCT I-STAT 7, (LYTES, BLD GAS, ICA,H+H)
Acid-Base Excess: 3 mmol/L — ABNORMAL HIGH (ref 0.0–2.0)
Acid-base deficit: 1 mmol/L (ref 0.0–2.0)
Bicarbonate: 20.7 mmol/L (ref 20.0–28.0)
Bicarbonate: 23.7 mmol/L (ref 20.0–28.0)
Calcium, Ion: 1 mmol/L — ABNORMAL LOW (ref 1.15–1.40)
Calcium, Ion: 1.04 mmol/L — ABNORMAL LOW (ref 1.15–1.40)
HCT: 35 % — ABNORMAL LOW (ref 39.0–52.0)
HCT: 35 % — ABNORMAL LOW (ref 39.0–52.0)
Hemoglobin: 11.9 g/dL — ABNORMAL LOW (ref 13.0–17.0)
Hemoglobin: 11.9 g/dL — ABNORMAL LOW (ref 13.0–17.0)
O2 Saturation: 100 %
O2 Saturation: 98 %
Patient temperature: 98.6
Patient temperature: 98.6
Potassium: 3.1 mmol/L — ABNORMAL LOW (ref 3.5–5.1)
Potassium: 3.1 mmol/L — ABNORMAL LOW (ref 3.5–5.1)
Sodium: 143 mmol/L (ref 135–145)
Sodium: 145 mmol/L (ref 135–145)
TCO2: 21 mmol/L — ABNORMAL LOW (ref 22–32)
TCO2: 25 mmol/L (ref 22–32)
pCO2 arterial: 25.2 mmHg — ABNORMAL LOW (ref 32.0–48.0)
pCO2 arterial: 26 mmHg — ABNORMAL LOW (ref 32.0–48.0)
pH, Arterial: 7.522 — ABNORMAL HIGH (ref 7.350–7.450)
pH, Arterial: 7.569 — ABNORMAL HIGH (ref 7.350–7.450)
pO2, Arterial: 222 mmHg — ABNORMAL HIGH (ref 83.0–108.0)
pO2, Arterial: 98 mmHg (ref 83.0–108.0)

## 2020-07-07 LAB — URINALYSIS, ROUTINE W REFLEX MICROSCOPIC
Bilirubin Urine: NEGATIVE
Glucose, UA: NEGATIVE mg/dL
Hgb urine dipstick: NEGATIVE
Ketones, ur: 5 mg/dL — AB
Leukocytes,Ua: NEGATIVE
Nitrite: NEGATIVE
Protein, ur: NEGATIVE mg/dL
Specific Gravity, Urine: 1.013 (ref 1.005–1.030)
pH: 7 (ref 5.0–8.0)

## 2020-07-07 LAB — HEMOGLOBIN A1C
Hgb A1c MFr Bld: 5.7 % — ABNORMAL HIGH (ref 4.8–5.6)
Mean Plasma Glucose: 116.89 mg/dL

## 2020-07-07 LAB — VITAMIN B12: Vitamin B-12: 728 pg/mL (ref 180–914)

## 2020-07-07 LAB — RAPID URINE DRUG SCREEN, HOSP PERFORMED
Amphetamines: NOT DETECTED
Barbiturates: NOT DETECTED
Benzodiazepines: POSITIVE — AB
Cocaine: NOT DETECTED
Opiates: NOT DETECTED
Tetrahydrocannabinol: NOT DETECTED

## 2020-07-07 LAB — ECHOCARDIOGRAM COMPLETE
Area-P 1/2: 2.62 cm2
Height: 74 in
S' Lateral: 3.6 cm
Weight: 2465.62 oz

## 2020-07-07 LAB — PHOSPHORUS: Phosphorus: 2 mg/dL — ABNORMAL LOW (ref 2.5–4.6)

## 2020-07-07 LAB — MAGNESIUM: Magnesium: 2.1 mg/dL (ref 1.7–2.4)

## 2020-07-07 LAB — LIPID PANEL
Cholesterol: 225 mg/dL — ABNORMAL HIGH (ref 0–200)
HDL: 132 mg/dL (ref >40)
LDL Cholesterol: 79 mg/dL (ref 0–99)
Total CHOL/HDL Ratio: 1.7 ratio
Triglycerides: 68 mg/dL (ref <150)
VLDL: 14 mg/dL (ref 0–40)

## 2020-07-07 LAB — GLUCOSE, CAPILLARY
Glucose-Capillary: 123 mg/dL — ABNORMAL HIGH (ref 70–99)
Glucose-Capillary: 115 mg/dL — ABNORMAL HIGH (ref 70–99)
Glucose-Capillary: 123 mg/dL — ABNORMAL HIGH (ref 70–99)

## 2020-07-07 LAB — PROTIME-INR
INR: 1.1 (ref 0.8–1.2)
Prothrombin Time: 13.5 seconds (ref 11.4–15.2)

## 2020-07-07 LAB — SODIUM: Sodium: 142 mmol/L (ref 135–145)

## 2020-07-07 LAB — HIV ANTIBODY (ROUTINE TESTING W REFLEX): HIV Screen 4th Generation wRfx: NONREACTIVE

## 2020-07-07 MED ORDER — POTASSIUM CHLORIDE 20 MEQ PO PACK
40.0000 meq | PACK | Freq: Once | ORAL | Status: AC
  Administered 2020-07-07: 40 meq
  Filled 2020-07-07: qty 2

## 2020-07-07 MED ORDER — VITAL AF 1.2 CAL PO LIQD
1000.0000 mL | ORAL | Status: DC
  Administered 2020-07-07: 1000 mL
  Filled 2020-07-07: qty 1000

## 2020-07-07 MED ORDER — CHLORHEXIDINE GLUCONATE 0.12% ORAL RINSE (MEDLINE KIT)
15.0000 mL | Freq: Two times a day (BID) | OROMUCOSAL | Status: DC
  Administered 2020-07-07 – 2020-07-14 (×15): 15 mL via OROMUCOSAL

## 2020-07-07 MED ORDER — SODIUM CHLORIDE 0.9 % IV SOLN: INTRAVENOUS | Status: DC

## 2020-07-07 MED ORDER — VANCOMYCIN HCL 1250 MG/250ML IV SOLN
1250.0000 mg | Freq: Two times a day (BID) | INTRAVENOUS | Status: DC
  Administered 2020-07-07 – 2020-07-14 (×14): 1250 mg via INTRAVENOUS
  Filled 2020-07-07 (×15): qty 250

## 2020-07-07 MED ORDER — CHLORHEXIDINE GLUCONATE CLOTH 2 % EX PADS
6.0000 | MEDICATED_PAD | Freq: Every day | CUTANEOUS | Status: DC
  Administered 2020-07-07 – 2020-07-20 (×14): 6 via TOPICAL

## 2020-07-07 MED ORDER — IOHEXOL 350 MG/ML SOLN
100.0000 mL | Freq: Once | INTRAVENOUS | Status: AC | PRN
  Administered 2020-07-07: 100 mL via INTRAVENOUS

## 2020-07-07 MED ORDER — LABETALOL HCL 5 MG/ML IV SOLN
5.0000 mg | INTRAVENOUS | Status: DC | PRN
  Administered 2020-07-08: 10 mg via INTRAVENOUS
  Administered 2020-07-09: 20 mg via INTRAVENOUS
  Administered 2020-07-09: 10 mg via INTRAVENOUS
  Administered 2020-07-12 – 2020-07-14 (×5): 20 mg via INTRAVENOUS
  Administered 2020-07-16: 5 mg via INTRAVENOUS
  Administered 2020-07-17: 10 mg via INTRAVENOUS
  Filled 2020-07-07 (×11): qty 4

## 2020-07-07 MED ORDER — PANTOPRAZOLE SODIUM 40 MG PO PACK
40.0000 mg | PACK | Freq: Every day | ORAL | Status: DC
  Administered 2020-07-07: 40 mg
  Filled 2020-07-07: qty 20

## 2020-07-07 MED ORDER — SENNOSIDES-DOCUSATE SODIUM 8.6-50 MG PO TABS
1.0000 | ORAL_TABLET | Freq: Two times a day (BID) | ORAL | Status: DC
  Administered 2020-07-07 (×2): 1
  Filled 2020-07-07 (×2): qty 1

## 2020-07-07 MED ORDER — ADULT MULTIVITAMIN W/MINERALS CH
1.0000 | ORAL_TABLET | Freq: Every day | ORAL | Status: DC
  Administered 2020-07-07 – 2020-07-08 (×2): 1
  Filled 2020-07-07 (×2): qty 1

## 2020-07-07 MED ORDER — ORAL CARE MOUTH RINSE
15.0000 mL | OROMUCOSAL | Status: DC
  Administered 2020-07-07 – 2020-07-08 (×16): 15 mL via OROMUCOSAL

## 2020-07-07 MED ORDER — PROPOFOL 1000 MG/100ML IV EMUL
0.0000 ug/kg/min | INTRAVENOUS | Status: DC
  Administered 2020-07-07 (×2): 40 ug/kg/min via INTRAVENOUS
  Administered 2020-07-08: 45 ug/kg/min via INTRAVENOUS
  Filled 2020-07-07 (×6): qty 100

## 2020-07-07 MED ORDER — AMLODIPINE BESYLATE 10 MG PO TABS
10.0000 mg | ORAL_TABLET | Freq: Every day | ORAL | Status: DC
  Administered 2020-07-07 – 2020-07-08 (×2): 10 mg
  Filled 2020-07-07 (×2): qty 1

## 2020-07-07 MED ORDER — FOLIC ACID 1 MG PO TABS
1.0000 mg | ORAL_TABLET | Freq: Every day | ORAL | Status: DC
  Administered 2020-07-07 – 2020-07-08 (×2): 1 mg
  Filled 2020-07-07 (×2): qty 1

## 2020-07-07 MED ORDER — PROPOFOL 1000 MG/100ML IV EMUL
INTRAVENOUS | Status: AC
  Filled 2020-07-07: qty 100

## 2020-07-07 NOTE — Progress Notes (Addendum)
SLP Cancellation Note  Patient Details Name: Camara Rosander MRN: [id number] DOB: March 03, 1976   Cancelled treatment:         Order received for cognitive-speech assessment. Pt is intubated- had procedure this am. Will follow along.  Royce Macadamia 07/07/2020, 10:10 AM   Breck Coons Lonell Face.Ed Nurse, children's [phone] Office [phone]

## 2020-07-07 NOTE — Progress Notes (Signed)
Patient transported to MRI and back without complications. RN at bedside. 

## 2020-07-07 NOTE — Progress Notes (Addendum)
Subjective:  Patient reports intubated and following simple commands. EVD placed overnight.   Objective: Vital signs in last 24 hours: Temp:  [97.4 F (36.3 C)-98.9 F (37.2 C)] 98.9 F (37.2 C) (02/25 0800) Pulse Rate:  [63-146] 94 (02/25 1215) Resp:  [13-23] 14 (02/25 1102) BP: (107-167)/(65-116) 137/83 (02/25 1215) SpO2:  [91 %-100 %] 100 % (02/25 1215) Arterial Line BP: (117-158)/(67-90) 137/67 (02/25 0900) FiO2 (%):  [40 %] 40 % (02/25 1102) Weight:  [69.9 kg] 69.9 kg (02/25 0731)  Intake/Output from previous day: 02/24 0701 - 02/25 0700 In: 979 [I.V.:514.2; IV Piggyback:464.8] Out: 817 [Urine:800; Drains:17] Intake/Output this shift: Total I/O In: 218.4 [I.V.:218.4] Out: 2 [Drains:2]  Physical Exam: Patient is intubated and somnolent. He responds to voice. Follows simple commands. PERRLA 3mm brisk B/L. MAEW against gravity but not resistance.   Lab Results: Recent Labs    07-19-2020 1900 07/19/20 1946 07/07/20 0406 07/07/20 0858  WBC 5.9  --   --  8.1  HGB 12.7*   < > 11.9* 11.3*  HCT 38.2*   < > 35.0* 32.4*  PLT 212  --   --  242   < > = values in this interval not displayed.   BMET Recent Labs    07-19-2020 1900 07/19/2020 1946 2020-07-19 2007 2020-07-19 2105 07/07/20 0406 07/07/20 0858  NA 142   < > 139   < > 145 145  K 3.6   < > 4.6   < > 3.1* 3.1*  CL 102  --  108  --   --  106  CO2 24  --   --   --   --  19*  GLUCOSE 90  --  88  --   --  95  BUN 6  --  7  --   --  5*  CREATININE 0.70  --  0.90  --   --  0.76  CALCIUM 8.9  --   --   --   --  8.2*   < > = values in this interval not displayed.    Studies/Results: CT Code Stroke CTA Head W/WO contrast  Result Date: 07/07/2020 CLINICAL DATA:  Syncope. Abnormal neurological exam. Right basal ganglia hemorrhage. EXAM: CT ANGIOGRAPHY HEAD AND NECK TECHNIQUE: Multidetector CT imaging of the head and neck was performed using the standard protocol during bolus administration of intravenous contrast. Multiplanar  CT image reconstructions and MIPs were obtained to evaluate the vascular anatomy. Carotid stenosis measurements (when applicable) are obtained utilizing NASCET criteria, using the distal internal carotid diameter as the denominator. CONTRAST:  OMNIPAQUE IOHEXOL 350 MG/ML SOLN COMPARISON:  July 19, 2020 FINDINGS: Despite clearly requesting head CT be included, this was not done. In comparing the angiographic imaging, I think it is possible to determine that the size of right basal ganglia hematoma is not increased, measured at about 8 x 25 mm. Intraventricular hemorrhage again shows filling of the upper fourth ventricle, aqueduct and third ventricle. There is probably slightly more clot within the right lateral ventricle than seen previously. Similar amount of blood in the anterior left lateral ventricle. Slightly more blood layering in the occipital horns. Ventricular size is minimally larger. CTA NECK FINDINGS Aortic arch: No aortic atherosclerotic calcification. Branching pattern is normal. Right carotid system: Common carotid artery widely patent to the bifurcation. Carotid bifurcation is normal without soft or calcified plaque. Cervical ICA is normal. Left carotid system: Common carotid artery widely patent to the bifurcation. Carotid bifurcation is normal without soft or  calcified plaque. Cervical ICA widely patent Vertebral arteries: Both vertebral arteries widely patent from their origins to the basilar artery. Skeleton: Ordinary mid cervical spondylosis. Other neck: No mass or lymphadenopathy. Upper chest: Intubated.  Volume loss in the left lower lobe. Review of the MIP images confirms the above findings CTA HEAD FINDINGS Anterior circulation: Both internal carotid arteries are patent through the skull base and siphon regions. No siphon atherosclerosis. The anterior and middle cerebral vessels show flow. No high flow vascular abnormality seen in the region of hemorrhage. Posterior circulation: Both  vertebral arteries patent to the basilar. No basilar stenosis. Posterior circulation branch vessels are patent. Venous sinuses: Patent and normal. Anatomic variants: None significant Review of the MIP images confirms the above findings IMPRESSION: 1. No large or medium vessel occlusion or correctable proximal stenosis. No high flow vascular abnormality seen in the region of the right basal ganglia hematoma. No sign of atherosclerotic vascular disease. 2. There has not been increase in size of the right basal ganglia hemorrhage. There is slightly more intraventricular blood than was seen previously. This is particularly notable in the right lateral ventricle. Slight enlargement in ventricular size when compared to yesterday's study. Electronically Signed   By: Paulina Fusi M.D.   On: 07/07/2020 00:54   DG Abd 1 View  Result Date: 07/07/2020 CLINICAL DATA:  45 year old male OG tube placement.  Syncope. Right basal ganglia hemorrhage postoperative day zero EVD placement. EXAM: ABDOMEN - 1 VIEW COMPARISON:  CTA head and neck the same day. FINDINGS: Portable AP view at 0852 hours. Enteric tube placed into the stomach, side hole at the level of the gastric body. Mild gastric distention with air. Excreted IV contrast in bilateral renal collecting systems and prominent urinary bladder. There is a mild to moderate degree of right hydronephrosis. Negative visible bowel gas pattern. No osseous abnormality identified. IMPRESSION: 1. Mild to moderate right hydronephrosis evident as the right kidney is excreting IV contrast from earlier today. 2. Enteric tube placed into the stomach, side hole the level of the gastric fundus. Normal bowel gas pattern. Electronically Signed   By: Odessa Fleming M.D.   On: 07/07/2020 11:04   CT Head Wo Contrast  Addendum Date: 07/02/2020   ADDENDUM REPORT: 06/27/2020 20:52 ADDENDUM: Upon further review there is new mild to moderate panventricular dilatation compared to prior exam. These results  were called by telephone at the time of interpretation on 07/07/2020 at 8:47 pm to Dr. Iver Nestle, who verbally acknowledged these results. Electronically Signed   By: Stana Bunting M.D.   On: 06/30/2020 20:52   Result Date: 07/10/2020 CLINICAL DATA:  Syncope, simple, abnormal neuro exam EXAM: CT HEAD WITHOUT CONTRAST TECHNIQUE: Contiguous axial images were obtained from the base of the skull through the vertex without intravenous contrast. COMPARISON:  11/29/2019 and prior FINDINGS: Brain: Acute parenchymal hemorrhage measuring 2.0 x 0.9 x 1.2 cm centered within the right basal ganglia/thalamus. Intraventricular extension of hemorrhage involving the right greater than left lateral, third and fourth ventricles. Hemorrhage also opacifies the foramen of Luschka and upper cervical canal. No ventricular dilatation. No midline shift. No extra-axial fluid collection. No mass lesion. Vascular: No hyperdense vessel or unexpected calcification. Skull: Negative for fracture or focal lesion. Sinuses/Orbits: Normal orbits. Minimal ethmoid and maxillary sinus mucosal thickening. No mastoid effusion. Other: None. 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 No midline shift or ventriculomegaly. These results were called by telephone at the time of interpretation on 06/16/2020 at 7:39 pm  to provider Chaney Malling, who verbally acknowledged these results. Electronically Signed: By: Stana Bunting M.D. On: 07/15/20 19:44   CT Code Stroke CTA Neck W/WO contrast  Result Date: 07/07/2020 CLINICAL DATA:  Syncope. Abnormal neurological exam. Right basal ganglia hemorrhage. EXAM: CT ANGIOGRAPHY HEAD AND NECK TECHNIQUE: Multidetector CT imaging of the head and neck was performed using the standard protocol during bolus administration of intravenous contrast. Multiplanar CT image reconstructions and MIPs were obtained to evaluate the vascular anatomy. Carotid stenosis measurements  (when applicable) are obtained utilizing NASCET criteria, using the distal internal carotid diameter as the denominator. CONTRAST:  OMNIPAQUE IOHEXOL 350 MG/ML SOLN COMPARISON:  15-Jul-2020 FINDINGS: Despite clearly requesting head CT be included, this was not done. In comparing the angiographic imaging, I think it is possible to determine that the size of right basal ganglia hematoma is not increased, measured at about 8 x 25 mm. Intraventricular hemorrhage again shows filling of the upper fourth ventricle, aqueduct and third ventricle. There is probably slightly more clot within the right lateral ventricle than seen previously. Similar amount of blood in the anterior left lateral ventricle. Slightly more blood layering in the occipital horns. Ventricular size is minimally larger. CTA NECK FINDINGS Aortic arch: No aortic atherosclerotic calcification. Branching pattern is normal. Right carotid system: Common carotid artery widely patent to the bifurcation. Carotid bifurcation is normal without soft or calcified plaque. Cervical ICA is normal. Left carotid system: Common carotid artery widely patent to the bifurcation. Carotid bifurcation is normal without soft or calcified plaque. Cervical ICA widely patent Vertebral arteries: Both vertebral arteries widely patent from their origins to the basilar artery. Skeleton: Ordinary mid cervical spondylosis. Other neck: No mass or lymphadenopathy. Upper chest: Intubated.  Volume loss in the left lower lobe. Review of the MIP images confirms the above findings CTA HEAD FINDINGS Anterior circulation: Both internal carotid arteries are patent through the skull base and siphon regions. No siphon atherosclerosis. The anterior and middle cerebral vessels show flow. No high flow vascular abnormality seen in the region of hemorrhage. Posterior circulation: Both vertebral arteries patent to the basilar. No basilar stenosis. Posterior circulation branch vessels are patent.  Venous sinuses: Patent and normal. Anatomic variants: None significant Review of the MIP images confirms the above findings IMPRESSION: 1. No large or medium vessel occlusion or correctable proximal stenosis. No high flow vascular abnormality seen in the region of the right basal ganglia hematoma. No sign of atherosclerotic vascular disease. 2. There has not been increase in size of the right basal ganglia hemorrhage. There is slightly more intraventricular blood than was seen previously. This is particularly notable in the right lateral ventricle. Slight enlargement in ventricular size when compared to yesterday's study. Electronically Signed   By: Paulina Fusi M.D.   On: 07/07/2020 00:54   Portable Chest xray  Result Date: 07/07/2020 CLINICAL DATA:  Intubation EXAM: PORTABLE CHEST 1 VIEW COMPARISON:  Radiograph 07-15-2020 FINDINGS: Endotracheal tube tip terminates in the mid trachea, 5 cm from the carina. Some streaky opacities are noted in the lung bases likely reflecting atelectasis. No consolidative process or convincing features of edema. No pneumothorax or visible effusion. No acute osseous or soft tissue abnormality. Telemetry leads overlie the chest. IMPRESSION: 1. Endotracheal tube tip appropriately terminates in the mid trachea, 5 cm from the carina. 2. Mild bibasilar atelectasis. Electronically Signed   By: Kreg Shropshire M.D.   On: 07/07/2020 02:19   DG Chest Portable 1 View  Result Date: July 15, 2020 CLINICAL DATA:  Syncope. History of seizures. Mental status changes. EXAM: PORTABLE CHEST 1 VIEW COMPARISON:  11/29/2019 FINDINGS: The Chin overlies the apices. Midline trachea. Normal heart size. No pleural effusion or pneumothorax. Clear lungs. IMPRESSION: No acute cardiopulmonary disease. Electronically Signed   By: Jeronimo GreavesKyle  Talbot M.D.   On: 06/28/20 19:34   ECHOCARDIOGRAM COMPLETE  Result Date: 07/07/2020    ECHOCARDIOGRAM REPORT   Patient Name:   Charles Aguilar Date of Exam: 07/07/2020  Medical Rec #:  [id number]           Height:       74.0 in Accession #:    [id number]          Weight:       154.1 lb Date of Birth:  03/18/1976            BSA:          1.946 m Patient Age:    44 years            BP:           117/87 mmHg Patient Gender: M                   HR:           101 bpm. Exam Location:  Inpatient Procedure: 2D Echo, Color Doppler and Cardiac Doppler Indications:    Stroke I63.9  History:        Patient has no prior history of Echocardiogram examinations.  Sonographer:    Eulah PontSarah Pirrotta RDCS Referring Phys: [id number] SRISHTI L BHAGAT IMPRESSIONS  1. Left ventricular ejection fraction, by estimation, is 55 to 60%. The left ventricle has normal function. The left ventricle has no regional wall motion abnormalities. Left ventricular diastolic parameters were normal.  2. Right ventricular systolic function is normal. The right ventricular size is normal.  3. The mitral valve is normal in structure. No evidence of mitral valve regurgitation. No evidence of mitral stenosis.  4. The aortic valve is normal in structure. Aortic valve regurgitation is not visualized. No aortic stenosis is present.  5. The inferior vena cava is normal in size with greater than 50% respiratory variability, suggesting right atrial pressure of 3 mmHg. Conclusion(s)/Recommendation(s): No intracardiac source of embolism detected on this transthoracic study. A transesophageal echocardiogram is recommended to exclude cardiac source of embolism if clinically indicated. FINDINGS  Left Ventricle: Left ventricular ejection fraction, by estimation, is 55 to 60%. The left ventricle has normal function. The left ventricle has no regional wall motion abnormalities. The left ventricular internal cavity size was normal in size. There is  no left ventricular hypertrophy. Left ventricular diastolic parameters were normal. Right Ventricle: The right ventricular size is normal. No increase in right ventricular wall thickness. Right  ventricular systolic function is normal. Left Atrium: Left atrial size was normal in size. Right Atrium: Right atrial size was normal in size. Pericardium: There is no evidence of pericardial effusion. Mitral Valve: The mitral valve is normal in structure. No evidence of mitral valve regurgitation. No evidence of mitral valve stenosis. Tricuspid Valve: The tricuspid valve is normal in structure. Tricuspid valve regurgitation is not demonstrated. No evidence of tricuspid stenosis. Aortic Valve: The aortic valve is normal in structure. Aortic valve regurgitation is not visualized. No aortic stenosis is present. Pulmonic Valve: The pulmonic valve was normal in structure. Pulmonic valve regurgitation is not visualized. No evidence of pulmonic stenosis. Aorta: The aortic root is normal in size and structure. Venous: The inferior vena cava  is normal in size with greater than 50% respiratory variability, suggesting right atrial pressure of 3 mmHg. IAS/Shunts: No atrial level shunt detected by color flow Doppler.  LEFT VENTRICLE PLAX 2D LVIDd:         5.30 cm  Diastology LVIDs:         3.60 cm  LV e' medial:    10.00 cm/s LV PW:         0.70 cm  LV E/e' medial:  8.5 LV IVS:        0.60 cm  LV e' lateral:   11.30 cm/s LVOT diam:     2.10 cm  LV E/e' lateral: 7.5 LV SV:         72 LV SV Index:   37 LVOT Area:     3.46 cm  RIGHT VENTRICLE RV S prime:     14.80 cm/s TAPSE (M-mode): 2.2 cm LEFT ATRIUM             Index       RIGHT ATRIUM           Index LA diam:        2.30 cm 1.18 cm/m  RA Area:     11.10 cm LA Vol (A2C):   39.9 ml 20.51 ml/m RA Volume:   25.40 ml  13.06 ml/m LA Vol (A4C):   42.5 ml 21.85 ml/m LA Biplane Vol: 45.6 ml 23.44 ml/m  AORTIC VALVE LVOT Vmax:   144.00 cm/s LVOT Vmean:  94.800 cm/s LVOT VTI:    0.209 m  AORTA Ao Root diam: 3.00 cm Ao Asc diam:  2.80 cm MITRAL VALVE MV Area (PHT): 2.62 cm     SHUNTS MV Decel Time: 290 msec     Systemic VTI:  0.21 m MV E velocity: 85.10 cm/s   Systemic Diam: 2.10  cm MV A velocity: 115.00 cm/s MV E/A ratio:  0.74 Donato Schultz MD Electronically signed by Donato Schultz MD Signature Date/Time: 07/07/2020/12:58:29 PM    Final     Assessment/Plan: 44 y.o.male with 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 Neuro examination initially improved with treatment of his hypertension and an EVD was not warranted. However, he began to decline and was intubated for airway protection. Revaluation led to decision to proceed with EVD placement. Continue EVD at 10 Cm H20. Patient has penicillin allergy, ancef contraindicated. Will add prophylactic vancomycin for EVD. Frequent neuro checks. Maintain SBP < 140. No new neurosurgical recommendations at this time.    LOS: 1 day     Council Mechanic, DNP, NP-C 07/07/2020, 1:28 PM

## 2020-07-07 NOTE — Progress Notes (Signed)
OT Cancellation Note  Patient Details Name: Charles Aguilar MRN: [id number] DOB: 1976-02-25   Cancelled Treatment:    Reason Eval/Treat Not Completed: Active bedrest order OT order received and appreciated however this conflicts with current bedrest order set. Please increase activity tolerance as appropriate and remove bedrest from orders. . Please contact OT at [phone] if bed rest order is discontinued. OT will hold evaluation at this time and will check back as time allows pending increased activity orders.   Wynona Neat, OTR/L  Acute Rehabilitation Services Pager: [phone] Office: [phone] .  07/07/2020, 8:36 AM

## 2020-07-07 NOTE — Procedures (Signed)
Arterial Catheter Insertion Procedure Note  Charles Aguilar  [id number]  [birth year]  Date:07/07/20  Time:12:31 AM    Provider Performing: Adela Ports    Procedure: Insertion of Arterial Line (11173) without US guidance  Indication(s) Blood pressure monitoring and/or need for frequent ABGs  Consent Unable to obtain consent due to emergent nature of procedure.  Anesthesia None   Time Out Verified patient identification, verified procedure, site/side was marked, verified correct patient position, special equipment/implants available, medications/allergies/relevant history reviewed, required imaging and test results available.   Sterile Technique Maximal sterile technique including full sterile barrier drape, hand hygiene, sterile gown, sterile gloves, mask, hair covering, sterile ultrasound probe cover (if used).   Procedure Description Area of catheter insertion was cleaned with chlorhexidine and draped in sterile fashion. Without real-time ultrasound guidance an arterial catheter was placed into the right radial artery.  Appropriate arterial tracings confirmed on monitor.     Complications/Tolerance None; patient tolerated the procedure well.   EBL Minimal   Specimen(s) None

## 2020-07-07 NOTE — Progress Notes (Signed)
PT Cancellation Note  Patient Details Name: Charles Aguilar MRN: [id number] DOB: [birth year]   Cancelled Treatment:    Reason Eval/Treat Not Completed: Active bedrest order this morning. PT will continue to follow and evaluate as appropriate.   Rolm Baptise, PT, DPT   Acute Rehabilitation Department Pager #: [phone]   [name] 07/07/2020, 7:38 AM

## 2020-07-07 NOTE — Op Note (Signed)
Preop Dx: ICH and Hydrocephaul Postop Dx: Same Procedure: Left frontal IVC placement Surgeon: Venetia Maxon Anesthesia: Lidocaine and IV sedation with Propofol and Versed Complications: None  Patient has HCP and IVH with obtundation.  Left frontal scalp was prepped and draped after shaving this region.  Area of planned incision was infiltrated with lidocaine with epinephrine.  A stab incision was created and trephine made. The dura was incised and IVC drain was placed with brisk return of blood tinged CSF under pressure.  The drain was tunneled, anchored and hooked to the Buretrol after incisions were closed with 3-0 Nylon sutures.  The wound was dressed with a sterile occlusive dressing.  The patient appeared to tolerate the procedure well.

## 2020-07-07 NOTE — Consult Note (Signed)
NAME:  Charles Aguilar, MRN:  [id number], DOB:  Jan 11, 1976, LOS: 1 ADMISSION DATE:  07/01/2020, CONSULTATION DATE:  2/24 REFERRING MD:  Dr. Iver Nestle, CHIEF COMPLAINT:  ICH   Brief History:  45 year old male presenting with headache and AMS found to have ICH and hypertensive emergency.   History of Present Illness:  45 year old male with PMH as below, which is significant for alcoholism. He reportedly drinks vodka continuously. Family does not have an estimate on actual volume. He was in his usual state of health until 2/24 when he arrived to pick his brother up from work while complaining of a headache and dizziness. His brother drove home and the patient slept. Upon arriving home, the patient began to vomit and fell out of the vehicle striking the right side of his face. It was at this point his brother called EMS who transported the patient to [facility] ED. CT scan was done in the ED and demonstrated R ICH with IVF and ventricular dilation. Neurosurgery and neurology consulted. The patient was started on clevidipine for blood pressure management and was scheduled for CT angiogram of the head. PCCM was asked to admit due to poor mentation and concern for poor airway protection.    Past Medical History:   has a past medical history of Epilepsy (HCC).   Significant Hospital Events:  2/24 admit for ICH  Consults:  Neurology Neurosurgery  Procedures:    Significant Diagnostic Tests:  CT head 2/24 > 2.0 cm right basal ganglia/thalamic hemorrhage with intraventricular Extension. Upon further review there is new mild to moderate panventricular dilatation compared to prior exam. MRI Brain 2/25 >>> 1. Motion degraded study. 2. Right basal ganglia/thalamic intraparenchymal hemorrhage decompressing into the ventricular system with large amount of blood within the supratentorial and infratentorial ventricular system, stable. 3. Left frontal approach ventricular drain with the tip within  the frontal horn of the left lateral ventricle with stable size of the ventricular system.  Micro Data:    Antimicrobials:    Interim History / Subjective:  Intubated, sedated. Mother at bedside. He is on propofol, cleviprex, and hypertonic saline. EVD at -10cm H20.   Objective   Blood pressure 137/83, pulse 94, temperature 98.9 F (37.2 C), temperature source Axillary, resp. rate 14, height 6\' 2"  (1.88 m), weight 69.9 kg, SpO2 100 %.    Vent Mode: PRVC FiO2 (%):  [40 %] 40 % Set Rate:  [12 bmp-16 bmp] 12 bmp Vt Set:  [600 mL-650 mL] 600 mL PEEP:  [5 cmH20] 5 cmH20 Pressure Support:  [10 cmH20] 10 cmH20 Plateau Pressure:  [16 cmH20] 16 cmH20   Intake/Output Summary (Last 24 hours) at 07/07/2020 1340 Last data filed at 07/07/2020 1100 Gross per 24 hour  Intake 1197.36 ml  Output 819 ml  Net 378.36 ml   Filed Weights   07/07/20 0731  Weight: 69.9 kg    Examination: Intubated, sedated Opens eyes to verbal and tactile stimuli Withdraws to pain   Labs reviewed personally Na 145, K 3.1, Cr 0.76, Hgb 11.3  Chest xray 2/25 reviewed Hyperinflation, ETT in place   Resolved Hospital Problem list     Assessment & Plan:   Intracranial hemorrhage: R basal ganglia/thalamic.  - CTA and MRI competed -  - neurology, neurosurgery following.  - Hypertonic saline per neurology - Serial sodium check - Frequent neuro checks - clevidipine infusion to keep SBP < 3/25 - Hypertonic saline per neurology . No CVL required at current infusion rate -  maintain arterial line for BP monitoring  Acute hypoxemic respiratory failure due to poor mentation and severe aspiration risk - maintain  Full vent support - LTVV - CXR, ABG - VAP bunde  Hypertensive crisis - Clevidipine as above.  - will add amlodipine for BP management - ICU hemodynamic monitoring.   Seizure history: no home medications documented.  - seizure precautions - potentially in the setting of prior etoh  use?  Hypokalemia - Potassium replaced, repeat BMET  Alcohol abuse: Alcohol level 242 on admission - folic acid - thiamine - MVI - No current need for CIWA - observe for signs of withdrawal  Transaminitis: likely secondary to alcohol  Hyperammonemia - monitor Ammonia levels - no prior history of hepatic encephaloapthy - consider lactulose if not improving.   Best practice (evaluated daily)  Diet: NPO, will initiate tube feeds Pain/Anxiety/Delirium protocol (if indicated): NA VAP protocol (if indicated): NA DVT prophylaxis: SCD GI prophylaxis: PPI Glucose control: NA Mobility: BR Disposition:ICU  Goals of Care:  Last date of multidisciplinary goals of care discussion: 2/25 Family and staff present: RN, patient's mother  Summary of discussion: maintaining full vent support, awaiting neurologic recovery.  Follow up goals of care discussion due: 3/3 Code Status: FULL  Labs   CBC: Recent Labs  Lab 07/03/2020 1900 07/02/2020 1946 06/21/2020 2007 07/07/20 0010 07/07/20 0406 07/07/20 0858  WBC 5.9  --   --   --   --  8.1  NEUTROABS 3.4  --   --   --   --   --   HGB 12.7* 12.9* 13.3 11.9* 11.9* 11.3*  HCT 38.2* 38.0* 39.0 35.0* 35.0* 32.4*  MCV 104.9*  --   --   --   --  102.2*  PLT 212  --   --   --   --  242    Basic Metabolic Panel: Recent Labs  Lab 06/17/2020 1900 07/03/2020 1946 06/17/2020 2007 07/04/2020 2105 07/07/20 0010 07/07/20 0325 07/07/20 0406 07/07/20 0858  NA 142 142 139 138 143 142 145 145  K 3.6 3.5 4.6  --  3.1*  --  3.1* 3.1*  CL 102  --  108  --   --   --   --  106  CO2 24  --   --   --   --   --   --  19*  GLUCOSE 90  --  88  --   --   --   --  95  BUN 6  --  7  --   --   --   --  5*  CREATININE 0.70  --  0.90  --   --   --   --  0.76  CALCIUM 8.9  --   --   --   --   --   --  8.2*  MG 1.3*  --   --   --   --   --   --   --    GFR: Estimated Creatinine Clearance: 116.5 mL/min (by C-G formula based on SCr of 0.76 mg/dL). Recent Labs  Lab  07/02/2020 1900 07/07/20 0858  WBC 5.9 8.1    Liver Function Tests: Recent Labs  Lab 06/25/2020 1900  AST 129*  ALT 83*  ALKPHOS 114  BILITOT 0.6  PROT 8.0  ALBUMIN 3.6   No results for input(s): LIPASE, AMYLASE in the last 168 hours. Recent Labs  Lab 07/09/2020 1933  AMMONIA 59*    ABG  Component Value Date/Time   PHART 7.522 (H) 07/07/2020 0406   PCO2ART 25.2 (L) 07/07/2020 0406   PO2ART 98 07/07/2020 0406   HCO3 20.7 07/07/2020 0406   TCO2 21 (L) 07/07/2020 0406   ACIDBASEDEF 1.0 07/07/2020 0406   O2SAT 98.0 07/07/2020 0406     Coagulation Profile: Recent Labs  Lab 07/07/20 0140  INR 1.1    Cardiac Enzymes: No results for input(s): CKTOTAL, CKMB, CKMBINDEX, TROPONINI in the last 168 hours.  HbA1C: Hgb A1c MFr Bld  Date/Time Value Ref Range Status  07/07/2020 06:16 AM 5.7 (H) 4.8 - 5.6 % Final    Comment:    (NOTE) Pre diabetes:          5.7%-6.4%  Diabetes:              >6.4%  Glycemic control for   <7.0% adults with diabetes    The patient is critically ill due to respiratory failure, intracranial hemorrhage, hypertensive emergency.  They require high complexity decision making for assessment and support, frequent evaluation and titration of therapies, application of advanced monitoring technologies and extensive interpretation of multiple databases.   Critical Care Time devoted to patient care services described in this note is 36 minutes. This time reflects time of care of this signee Charlott Holler . This critical care time does not reflect separately billable procedures or procedure time, teaching time or supervisory time of PA/NP/Med student/Med Resident etc but could involve care discussion time.  Mickel Baas Pulmonary and Critical Care Medicine 07/07/2020 1:41 PM  Pager: see AMION After hours pager: [phone]  If no response to pager , please call [phone] until 7pm After 7:00 pm call Elink  [phone]

## 2020-07-07 NOTE — TOC CAGE-AID Note (Signed)
Transition of Care [facility]) - CAGE-AID Screening   Patient Details  Name: Charles Aguilar MRN: [id number] Date of Birth: February 10, 1976  Transition of [facility]) CM/SW Contact:    Mearl Latin, LCSW Phone Number: 07/07/2020, 2:29 PM   Clinical Narrative: Patient currently intubated and unable to participate.    CAGE-AID Screening: Substance Abuse Screening unable to be completed due to: : Patient unable to participate

## 2020-07-07 NOTE — Progress Notes (Signed)
Pharmacy Antibiotic Note  Charles Aguilar is a 45 y.o. male admitted on 06/20/2020 with a headache, found to have ICH with hydrocephalus s/p EVD placement on 07/07/20.  Pharmacy has been consulted for vancomycin dosing for prophylaxis.  Renal function stable, afebrile, WBC WNL.  Plan: Vanc 1250mg  IV Q12H for trough 10-15 mcg/mL Monitor renal fxn, clinical progress, vanc trough if on therapy for > 5 days or renal dysfunction   Height: 6\' 2"  (188 cm) Weight: 69.9 kg (154 lb 1.6 oz) IBW/kg (Calculated) : 82.2  Temp (24hrs), Avg:98 F (36.7 C), Min:97.1 F (36.2 C), Max:98.9 F (37.2 C)  Recent Labs  Lab 06/17/2020 1900 06/24/2020 2007 07/07/20 0858  WBC 5.9  --  8.1  CREATININE 0.70 0.90 0.76    Estimated Creatinine Clearance: 116.5 mL/min (by C-G formula based on SCr of 0.76 mg/dL).    Allergies  Allergen Reactions  . Penicillins Shortness Of Breath and Swelling    Has patient had a PCN reaction causing immediate rash, facial/tongue/throat swelling, SOB or lightheadedness with hypotension: yes Has patient had a PCN reaction causing severe rash involving mucus membranes or skin necrosis: no Has patient had a PCN reaction that required hospitalization: unknown Has patient had a PCN reaction occurring within the last 10 years: no If all of the above answers are "NO", then may proceed with Cephalosporin use.     Thuy D. 2008, PharmD, BCPS, BCCCP 07/07/2020, 2:33 PM

## 2020-07-07 NOTE — Progress Notes (Signed)
Transitions of Care Team following this patient for discharge planning.  Currently, patient not medically stable; will continue to follow as patient progresses.    Nashid Pellum LCSW  

## 2020-07-07 NOTE — Progress Notes (Addendum)
STROKE TEAM PROGRESS NOTE   INTERVAL HISTORY Pt is evaluated at the bedside. No family present in the room. On Neuro Exam he is intubated, drowsy but following all commands.B/l  Eyes conjugate position, Pupils equal and reactive to light. Pt can lift all extremities against gravity. B/L tremors present in Upper extremities.  Pt intially admitted with c/o Headache, dizziness, vomiting and progressive AMS.CT Head showed 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 CTA Head and Neck showed no large medium vessel occlusion or stenosis. No  increase in size of the right basal ganglia hemorrhage. Slightly more intraventricular blood particularly in Rt lateral Ventricle than was seen previously. Slight enlargement in ventricular size when compared to yesterday's study. NS consulted Frontal IVC placed on 2/25 by NS. BP controlled by Claviprex drip and labetalol PRN. HbA1c 5.7, LDL 79, K is low at 3.1, Na 145, creatinine 0.90, ECHO Pending, MRI pending. OT/PT Pending. 3% saline discontinued. Replete K per tube.  Vitals:   07/07/20 0600 07/07/20 0700 07/07/20 0731 07/07/20 0800  BP: 117/87 129/87 135/72 130/84  Pulse: (!) 108 100 (!) 107 (!) 116  Resp:   (!) 23   Temp:    98.9 F (37.2 C)  TempSrc:    Axillary  SpO2: 99% 99% 99% 98%  Weight:   69.9 kg   Height:   6\' 2"  (1.88 m)    CBC:  Recent Labs  Lab 06/14/2020 1900 07/05/2020 1946 07/07/20 0010 07/07/20 0406  WBC 5.9  --   --   --   NEUTROABS 3.4  --   --   --   HGB 12.7*   < > 11.9* 11.9*  HCT 38.2*   < > 35.0* 35.0*  MCV 104.9*  --   --   --   PLT 212  --   --   --    < > = values in this interval not displayed.   Basic Metabolic Panel:  Recent Labs  Lab 06/14/2020 1900 07/04/2020 1946 06/19/2020 2007 06/22/2020 2105 07/07/20 0010 07/07/20 0325 07/07/20 0406  NA 142   < > 139   < > 143 142 145  K 3.6   < > 4.6  --  3.1*  --  3.1*  CL 102  --  108  --   --   --   --   CO2 24  --   --   --   --   --    --   GLUCOSE 90  --  88  --   --   --   --   BUN 6  --  7  --   --   --   --   CREATININE 0.70  --  0.90  --   --   --   --   CALCIUM 8.9  --   --   --   --   --   --   MG 1.3*  --   --   --   --   --   --    < > = values in this interval not displayed.   Lipid Panel:  Recent Labs  Lab 07/07/20 0523  CHOL 225*  TRIG 68  HDL 132  CHOLHDL 1.7  VLDL 14  LDLCALC 79   HgbA1c:  Recent Labs  Lab 07/07/20 0616  HGBA1C 5.7*   Urine Drug Screen:  Recent Labs  Lab 07/07/20 0125  LABOPIA NONE DETECTED  COCAINSCRNUR NONE DETECTED  LABBENZ  POSITIVE*  AMPHETMU NONE DETECTED  THCU NONE DETECTED  LABBARB NONE DETECTED    Alcohol Level  Recent Labs  Lab 06/24/2020 1915  ETH 242*    IMAGING past 24 hours CT Code Stroke CTA Head W/WO contrast  Result Date: 07/07/2020 CLINICAL DATA:  Syncope. Abnormal neurological exam. Right basal ganglia hemorrhage. EXAM: CT ANGIOGRAPHY HEAD AND NECK TECHNIQUE: Multidetector CT imaging of the head and neck was performed using the standard protocol during bolus administration of intravenous contrast. Multiplanar CT image reconstructions and MIPs were obtained to evaluate the vascular anatomy. Carotid stenosis measurements (when applicable) are obtained utilizing NASCET criteria, using the distal internal carotid diameter as the denominator. CONTRAST:  OMNIPAQUE IOHEXOL 350 MG/ML SOLN COMPARISON:  07/07/2020 FINDINGS: Despite clearly requesting head CT be included, this was not done. In comparing the angiographic imaging, I think it is possible to determine that the size of right basal ganglia hematoma is not increased, measured at about 8 x 25 mm. Intraventricular hemorrhage again shows filling of the upper fourth ventricle, aqueduct and third ventricle. There is probably slightly more clot within the right lateral ventricle than seen previously. Similar amount of blood in the anterior left lateral ventricle. Slightly more blood layering in the  occipital horns. Ventricular size is minimally larger. CTA NECK FINDINGS Aortic arch: No aortic atherosclerotic calcification. Branching pattern is normal. Right carotid system: Common carotid artery widely patent to the bifurcation. Carotid bifurcation is normal without soft or calcified plaque. Cervical ICA is normal. Left carotid system: Common carotid artery widely patent to the bifurcation. Carotid bifurcation is normal without soft or calcified plaque. Cervical ICA widely patent Vertebral arteries: Both vertebral arteries widely patent from their origins to the basilar artery. Skeleton: Ordinary mid cervical spondylosis. Other neck: No mass or lymphadenopathy. Upper chest: Intubated.  Volume loss in the left lower lobe. Review of the MIP images confirms the above findings CTA HEAD FINDINGS Anterior circulation: Both internal carotid arteries are patent through the skull base and siphon regions. No siphon atherosclerosis. The anterior and middle cerebral vessels show flow. No high flow vascular abnormality seen in the region of hemorrhage. Posterior circulation: Both vertebral arteries patent to the basilar. No basilar stenosis. Posterior circulation branch vessels are patent. Venous sinuses: Patent and normal. Anatomic variants: None significant Review of the MIP images confirms the above findings IMPRESSION: 1. No large or medium vessel occlusion or correctable proximal stenosis. No high flow vascular abnormality seen in the region of the right basal ganglia hematoma. No sign of atherosclerotic vascular disease. 2. There has not been increase in size of the right basal ganglia hemorrhage. There is slightly more intraventricular blood than was seen previously. This is particularly notable in the right lateral ventricle. Slight enlargement in ventricular size when compared to yesterday's study. Electronically Signed   By: Paulina Fusi M.D.   On: 07/07/2020 00:54   CT Head Wo Contrast  Addendum Date:  06/15/2020   ADDENDUM REPORT: 06/21/2020 20:52 ADDENDUM: Upon further review there is new mild to moderate panventricular dilatation compared to prior exam. These results were called by telephone at the time of interpretation on 06/28/2020 at 8:47 pm to Dr. Iver Nestle, who verbally acknowledged these results. Electronically Signed   By: Stana Bunting M.D.   On: 06/28/2020 20:52   Result Date: 06/26/2020 CLINICAL DATA:  Syncope, simple, abnormal neuro exam EXAM: CT HEAD WITHOUT CONTRAST TECHNIQUE: Contiguous axial images were obtained from the base of the skull through the vertex without intravenous contrast.  COMPARISON:  11/29/2019 and prior FINDINGS: Brain: Acute parenchymal hemorrhage measuring 2.0 x 0.9 x 1.2 cm centered within the right basal ganglia/thalamus. Intraventricular extension of hemorrhage involving the right greater than left lateral, third and fourth ventricles. Hemorrhage also opacifies the foramen of Luschka and upper cervical canal. No ventricular dilatation. No midline shift. No extra-axial fluid collection. No mass lesion. Vascular: No hyperdense vessel or unexpected calcification. Skull: Negative for fracture or focal lesion. Sinuses/Orbits: Normal orbits. Minimal ethmoid and maxillary sinus mucosal thickening. No mastoid effusion. Other: None. 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 No midline shift or ventriculomegaly. These results were called by telephone at the time of interpretation on 07/07/2020 at 7:39 pm to provider Chaney Malling, who verbally acknowledged these results. Electronically Signed: By: Stana Bunting M.D. On: 07/02/2020 19:44   CT Code Stroke CTA Neck W/WO contrast  Result Date: 07/07/2020 CLINICAL DATA:  Syncope. Abnormal neurological exam. Right basal ganglia hemorrhage. EXAM: CT ANGIOGRAPHY HEAD AND NECK TECHNIQUE: Multidetector CT imaging of the head and neck was performed using the standard protocol during  bolus administration of intravenous contrast. Multiplanar CT image reconstructions and MIPs were obtained to evaluate the vascular anatomy. Carotid stenosis measurements (when applicable) are obtained utilizing NASCET criteria, using the distal internal carotid diameter as the denominator. CONTRAST:  OMNIPAQUE IOHEXOL 350 MG/ML SOLN COMPARISON:  07/07/2020 FINDINGS: Despite clearly requesting head CT be included, this was not done. In comparing the angiographic imaging, I think it is possible to determine that the size of right basal ganglia hematoma is not increased, measured at about 8 x 25 mm. Intraventricular hemorrhage again shows filling of the upper fourth ventricle, aqueduct and third ventricle. There is probably slightly more clot within the right lateral ventricle than seen previously. Similar amount of blood in the anterior left lateral ventricle. Slightly more blood layering in the occipital horns. Ventricular size is minimally larger. CTA NECK FINDINGS Aortic arch: No aortic atherosclerotic calcification. Branching pattern is normal. Right carotid system: Common carotid artery widely patent to the bifurcation. Carotid bifurcation is normal without soft or calcified plaque. Cervical ICA is normal. Left carotid system: Common carotid artery widely patent to the bifurcation. Carotid bifurcation is normal without soft or calcified plaque. Cervical ICA widely patent Vertebral arteries: Both vertebral arteries widely patent from their origins to the basilar artery. Skeleton: Ordinary mid cervical spondylosis. Other neck: No mass or lymphadenopathy. Upper chest: Intubated.  Volume loss in the left lower lobe. Review of the MIP images confirms the above findings CTA HEAD FINDINGS Anterior circulation: Both internal carotid arteries are patent through the skull base and siphon regions. No siphon atherosclerosis. The anterior and middle cerebral vessels show flow. No high flow vascular abnormality seen in  the region of hemorrhage. Posterior circulation: Both vertebral arteries patent to the basilar. No basilar stenosis. Posterior circulation branch vessels are patent. Venous sinuses: Patent and normal. Anatomic variants: None significant Review of the MIP images confirms the above findings IMPRESSION: 1. No large or medium vessel occlusion or correctable proximal stenosis. No high flow vascular abnormality seen in the region of the right basal ganglia hematoma. No sign of atherosclerotic vascular disease. 2. There has not been increase in size of the right basal ganglia hemorrhage. There is slightly more intraventricular blood than was seen previously. This is particularly notable in the right lateral ventricle. Slight enlargement in ventricular size when compared to yesterday's study. Electronically Signed   By: Paulina Fusi M.D.   On:  07/07/2020 00:54   Portable Chest xray  Result Date: 07/07/2020 CLINICAL DATA:  Intubation EXAM: PORTABLE CHEST 1 VIEW COMPARISON:  Radiograph July 14, 2020 FINDINGS: Endotracheal tube tip terminates in the mid trachea, 5 cm from the carina. Some streaky opacities are noted in the lung bases likely reflecting atelectasis. No consolidative process or convincing features of edema. No pneumothorax or visible effusion. No acute osseous or soft tissue abnormality. Telemetry leads overlie the chest. IMPRESSION: 1. Endotracheal tube tip appropriately terminates in the mid trachea, 5 cm from the carina. 2. Mild bibasilar atelectasis. Electronically Signed   By: Kreg Shropshire M.D.   On: 07/07/2020 02:19   DG Chest Portable 1 View  Result Date: 2020-07-14 CLINICAL DATA:  Syncope. History of seizures. Mental status changes. EXAM: PORTABLE CHEST 1 VIEW COMPARISON:  11/29/2019 FINDINGS: The Chin overlies the apices. Midline trachea. Normal heart size. No pleural effusion or pneumothorax. Clear lungs. IMPRESSION: No acute cardiopulmonary disease. Electronically Signed   By: Jeronimo Greaves M.D.    On: 07-14-2020 19:34    PHYSICAL EXAM  GENERAL: Intubated , drowsy but follows all commands.  HEENT: - Normocephalic and atraumatic, dry mucous membranes. LUNGS - Symmetrical chest rise, No labored breathing noted CV - no JVD, No Peripheral Edema ABDOMEN - Soft,  nondistended  Ext: warm, well perfused, intact peripheral pulses, no Peripheral edema, B/L tremors present  NEURO Exam:   Mental Status: Intubated, drowsy but follows all commands.  Language: speech not tested as Pt is intubated.  Cranial Nerves:   CN II Pupils equal and reactive to light, no VF deficits.   CN III,IV,VI EOM intact, no gaze preference or deviation, no nystagmus.   CN V normal sensation in V1, V2, and V3 segments bilaterally.   CN VII Facial symmetry cannot be tested as Pt is intubated, no nasolabial fold flattening   CN VIII normal hearing to speech.   CN IX & X Cannot be tested as Pt is intubated   CN XI 5/5 head turn and 5/5 shoulder shrug bilaterally.   CN XII midline tongue protrusion   Motor: No Drift in Upper Extremities, No Drift in LE's. Strength at least 3+ in B/L LE. 4+/5 in B/L UE Tone: is normal and bulk is normal. Sensation- Intact to light touch b/l Coordination: FTN intact bilaterally, no ataxia , Tremors present B/L. Gait- deferred.  ASSESSMENT/PLAN [name] is a 45 y.o. male with a past medical history significant for ongoing alcohol abuse, tobacco abuse (3 to 40 cigarettes daily) Presented with Headache, dizziness, vomiting and progressive AMS. Ct Head showed 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 CTA Head and Neck showed no large medium vessel occlusion or stenosis. No  increase in size of the right basal ganglia hemorrhage. Slightly more intraventricular blood particularly in Rt lateral Ventricle than was seen previously. Slight enlargement in ventricular size when compared to yesterday's study. NS consulted Frontal IVC placed on 2/25  by NS. Started on 3% normal saline. BP controlled by Claviprex drip and labetalol PRN. HbA1c 5.7, LDL 79, ECHO Pending, MRI pending. OT/PT Pending.   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 There is new mild to moderate pan ventricular dilatation compared to prior exam.  CTA head & neck - No large or medium vessel occlusion or stenosis. No  increase in size of the  right basal ganglia hemorrhage. Slight enlargement in ventricular size when compared to yesterday's study.  MRI stable hematoma and IVH, stable size of ventricular system  2D Echo EF 55-60%  LDL 79  HgbA1c 5.7  VTE prophylaxis - SCD's now (due to ICH)  No antithrombotic prior to admission, now on No antithrombotic due to ICH  Therapy recommendations:  Pending  Disposition:  TBD  Hydrocephalous  Was on 3% Normal Saline>off now.  NSG on board  IVC placed on 2/25  ICP not high  Hypertension  Home meds:  None  Stable on cleviprex . BP goal systolic <160 mmHg . Long-term BP goal normotensive . Labetalol PRN  . On Claviprex . on Amlodipine 10 mg daily per tube.   Hyperlipidemia  Home meds:  None.  LDL 79, goal < 70  Consider statin on discharge  Smoker  Uses 3-40 cigarettes /day. Cigar use.  May offer Nicotine patch when stabalize.  Smoking cessation education will be provided   Alcohol Use  Heavy alcohol use  Continue Thiamine/MVI/ Folic acid  CIWA protocol  Hypokalemia  K is 3.1  Replete with 40 meq x 2 via tube.   Other Stroke Risk Factors  Hx of TIA  Hx of seizure - alcohol related ??   Hospital day # 1   ATTENDING NOTE: I reviewed above note and agree with the assessment and plan. Pt was seen and examined.   45 year old male with history of heavy alcohol, smoker, history of seizure and TIA admitted for headache, dizziness,  decreased level of consciousness, nausea vomiting and a fall.  CT head showed right CR ICH with IVH, concerning for hydrocephalus.  CT head and neck no LVO but seems increased hydrocephalus.  EVD placed, drainage small amount.  MRI showed stable hematoma and IVH and ventricular size.  EF 55 to 60%.  LDL 79 and A1c 5.7.  UDS positive for benzo.  Sodium 142, creatinine 0.90.  On exam, patient intubated, on sedation however, eyes open with voice, able to follow simple commands.  Blinking to visual threat bilaterally, tracking bilaterally, PERRL.  Facial symmetry not able to check due to ET tube.  Tongue protrusion not corporative.  Lateral upper extremity 3/5 proximal and distal, bilateral lower extremity 3/5.  Sensation subjectively symmetrical.  Finger-to-nose slow and not able to touch nose however no ataxia seen.  However, bilateral upper extremity tremor, likely related to alcohol use.  Etiology for patient ICH and IVH likely due to hypertension with risk factors of alcohol and smoking.  MRI showed stable hematoma, blood pressure goal will relax to less than 160.  Continue Cleviprex, will add p.o. BP meds.  EVD per neurosurgery.  CIWA protocol, avoid DT.  On B1/FA/multivitamin.  Extubate as able.  For detailed assessment and plan, please refer to above as I have made changes wherever appropriate.   Marvel Plan, MD PhD Stroke Neurology 07/07/2020 6:01 PM  This patient is critically ill due to ICH, IVH and hydrocephalus and at significant risk of neurological worsening, death form obstructive hydrocephalus, cerebral edema, brain herniation, seizure, DT. This patient's care requires constant monitoring of vital signs, hemodynamics, respiratory and cardiac monitoring, review of multiple databases, neurological assessment, discussion with family, other specialists and medical decision making of high complexity. I spent 40 minutes of neurocritical care time in the care of this patient. I had long discussion  with mom at bedside, updated pt current condition, treatment plan and potential prognosis, and answered all the questions.  She expressed understanding  and appreciation.     To contact Stroke Continuity provider, please refer to [email or web address]. After hours, contact General Neurology

## 2020-07-07 NOTE — Progress Notes (Signed)
Initial Nutrition Assessment  DOCUMENTATION CODES:   Not applicable  INTERVENTION:   Initiate tube feeding via OG tube: Vital AF 1.2 at 60 ml/h (1440 ml per day)  Provides 1728 kcal, 108 gm protein, 1167 ml free water daily  Total kcal: 2216 kcal   NUTRITION DIAGNOSIS:   Inadequate oral intake related to inability to eat as evidenced by NPO status.  GOAL:   Patient will meet greater than or equal to 90% of their needs  MONITOR:   TF tolerance  REASON FOR ASSESSMENT:   Consult,Ventilator Enteral/tube feeding initiation and management  ASSESSMENT:   Pt with PMH of ETOH abuse admitted with ICH.    2/25 s/p EVD  Pt discussed during ICU rounds and with RN.   Patient is currently intubated on ventilator support MV: 9.1 L/min Temp (24hrs), Avg:98.3 F (36.8 C), Min:97.4 F (36.3 C), Max:98.9 F (37.2 C)  Propofol: 16 ml/hr provides: 422 kcal Cleviprex @ 12 ml/hr provides: 576 kcal  Medications reviewed and include: folic acid, MVI, miralax, KCl, senokot-s, thiamine  Labs reviewed: K: 3.1   16 F OG: tip gastric fundus  Diet Order:   Diet Order            Diet NPO time specified  Diet effective now                 EDUCATION NEEDS:   No education needs have been identified at this time  Skin:  Skin Assessment: Reviewed RN Assessment  Last BM:  unknown  Height:   Ht Readings from Last 1 Encounters:  07/07/20 6\' 2"  (1.88 m)    Weight:   Wt Readings from Last 1 Encounters:  07/07/20 69.9 kg    Ideal Body Weight:     BMI:  Body mass index is 19.79 kg/m.  Estimated Nutritional Needs:   Kcal:  2100  Protein:  105-115 grams  Fluid:  >2 L/day  2101., RD, LDN, CNSC See AMiON for contact information

## 2020-07-07 NOTE — Progress Notes (Signed)
Pt transported to MRI. Propofol increased to 50 mcg/kg/min for increased pt comfort. Pt tolerated scan well. Pt became agitated toward the end of MRI. Dr. Roda Shutters notified and was given the OK to complete scan without doing contrast. VSS remained stable, BP stayed within ordered parameters and Neuro exam unchanged during scan. Pt brought back to 4N-ICU. Placed to unit monitor. EVD leveled and unclamped. Neuro exam unchanged and VSS.

## 2020-07-07 NOTE — Progress Notes (Signed)
  Echocardiogram 2D Echocardiogram has been performed.  Augustine Radar 07/07/2020, 9:48 AM

## 2020-07-08 ENCOUNTER — Inpatient Hospital Stay (HOSPITAL_COMMUNITY): Payer: Medicaid Other

## 2020-07-08 DIAGNOSIS — F10239 Alcohol dependence with withdrawal, unspecified: Secondary | ICD-10-CM

## 2020-07-08 DIAGNOSIS — I161 Hypertensive emergency: Secondary | ICD-10-CM

## 2020-07-08 LAB — MAGNESIUM: Magnesium: 1.9 mg/dL (ref 1.7–2.4)

## 2020-07-08 LAB — BASIC METABOLIC PANEL
Anion gap: 11 (ref 5–15)
BUN: 9 mg/dL (ref 6–20)
CO2: 23 mmol/L (ref 22–32)
Calcium: 8.7 mg/dL — ABNORMAL LOW (ref 8.9–10.3)
Chloride: 106 mmol/L (ref 98–111)
Creatinine, Ser: 0.63 mg/dL (ref 0.61–1.24)
GFR, Estimated: >60 mL/min (ref >60)
Glucose, Bld: 144 mg/dL — ABNORMAL HIGH (ref 70–99)
Potassium: 3.5 mmol/L (ref 3.5–5.1)
Sodium: 140 mmol/L (ref 135–145)

## 2020-07-08 LAB — CBC
HCT: 32.7 % — ABNORMAL LOW (ref 39.0–52.0)
Hemoglobin: 11.3 g/dL — ABNORMAL LOW (ref 13.0–17.0)
MCH: 35.3 pg — ABNORMAL HIGH (ref 26.0–34.0)
MCHC: 34.6 g/dL (ref 30.0–36.0)
MCV: 102.2 fL — ABNORMAL HIGH (ref 80.0–100.0)
Platelets: 200 10*3/uL (ref 150–400)
RBC: 3.2 MIL/uL — ABNORMAL LOW (ref 4.22–5.81)
RDW: 13.5 % (ref 11.5–15.5)
WBC: 11.2 10*3/uL — ABNORMAL HIGH (ref 4.0–10.5)
nRBC: 0 % (ref 0.0–0.2)

## 2020-07-08 LAB — GLUCOSE, CAPILLARY
Glucose-Capillary: 143 mg/dL — ABNORMAL HIGH (ref 70–99)
Glucose-Capillary: 133 mg/dL — ABNORMAL HIGH (ref 70–99)
Glucose-Capillary: 140 mg/dL — ABNORMAL HIGH (ref 70–99)
Glucose-Capillary: 139 mg/dL — ABNORMAL HIGH (ref 70–99)

## 2020-07-08 LAB — PHOSPHORUS
Phosphorus: 1.1 mg/dL — ABNORMAL LOW (ref 2.5–4.6)
Phosphorus: 3.1 mg/dL (ref 2.5–4.6)

## 2020-07-08 MED ORDER — AMLODIPINE BESYLATE 10 MG PO TABS
10.0000 mg | ORAL_TABLET | Freq: Every day | ORAL | Status: DC
Start: 2020-07-09 — End: 2020-07-20
  Administered 2020-07-09 – 2020-07-19 (×10): 10 mg via ORAL
  Filled 2020-07-08 (×10): qty 1

## 2020-07-08 MED ORDER — CARVEDILOL 12.5 MG PO TABS
12.5000 mg | ORAL_TABLET | Freq: Two times a day (BID) | ORAL | Status: DC
  Administered 2020-07-08: 12.5 mg
  Filled 2020-07-08: qty 1

## 2020-07-08 MED ORDER — LORAZEPAM 1 MG PO TABS: 1.0000 mg | ORAL_TABLET | ORAL | Status: DC | PRN

## 2020-07-08 MED ORDER — ADULT MULTIVITAMIN W/MINERALS CH
1.0000 | ORAL_TABLET | Freq: Every day | ORAL | Status: DC
  Administered 2020-07-09 – 2020-07-19 (×10): 1 via ORAL
  Filled 2020-07-08 (×10): qty 1

## 2020-07-08 MED ORDER — LORAZEPAM 2 MG/ML IJ SOLN
0.0000 mg | INTRAMUSCULAR | Status: DC
  Administered 2020-07-08 – 2020-07-09 (×3): 1 mg via INTRAVENOUS
  Filled 2020-07-08 (×3): qty 1

## 2020-07-08 MED ORDER — MAGNESIUM SULFATE 2 GM/50ML IV SOLN
2.0000 g | Freq: Once | INTRAVENOUS | Status: AC
  Administered 2020-07-08: 2 g via INTRAVENOUS
  Filled 2020-07-08: qty 50

## 2020-07-08 MED ORDER — LORAZEPAM 2 MG/ML IJ SOLN: 1.0000 mg | INTRAMUSCULAR | Status: DC | PRN

## 2020-07-08 MED ORDER — ORAL CARE MOUTH RINSE: 15.0000 mL | Freq: Two times a day (BID) | OROMUCOSAL | Status: DC

## 2020-07-08 MED ORDER — CARVEDILOL 12.5 MG PO TABS
12.5000 mg | ORAL_TABLET | Freq: Two times a day (BID) | ORAL | Status: DC
  Administered 2020-07-09 – 2020-07-12 (×7): 12.5 mg via ORAL
  Filled 2020-07-08 (×8): qty 1

## 2020-07-08 MED ORDER — FOLIC ACID 1 MG PO TABS
1.0000 mg | ORAL_TABLET | Freq: Every day | ORAL | Status: DC
  Administered 2020-07-09 – 2020-07-19 (×10): 1 mg via ORAL
  Filled 2020-07-08 (×10): qty 1

## 2020-07-08 MED ORDER — LORAZEPAM 2 MG/ML IJ SOLN: 0.0000 mg | Freq: Three times a day (TID) | INTRAMUSCULAR | Status: DC

## 2020-07-08 MED ORDER — PANTOPRAZOLE SODIUM 40 MG PO TBEC
40.0000 mg | DELAYED_RELEASE_TABLET | Freq: Every day | ORAL | Status: DC
  Administered 2020-07-08: 40 mg via ORAL
  Filled 2020-07-08: qty 1

## 2020-07-08 MED ORDER — POTASSIUM PHOSPHATES 15 MMOLE/5ML IV SOLN
45.0000 mmol | Freq: Once | INTRAVENOUS | Status: AC
  Administered 2020-07-08: 45 mmol via INTRAVENOUS
  Filled 2020-07-08: qty 15

## 2020-07-08 MED ORDER — SENNOSIDES-DOCUSATE SODIUM 8.6-50 MG PO TABS
1.0000 | ORAL_TABLET | Freq: Two times a day (BID) | ORAL | Status: DC
  Administered 2020-07-11 – 2020-07-19 (×15): 1 via ORAL
  Filled 2020-07-08 (×17): qty 1

## 2020-07-08 NOTE — Progress Notes (Signed)
SLP Cancellation Note  Patient Details Name: [name] MRN: [id number] DOB: 1975-06-05   Cancelled treatment:       Reason Eval/Treat Not Completed: Patient not medically ready.  Pt remains intubated at this time.  SLP will f/u as appropriate.    Shanon Rosser [place] 07/08/2020, 8:38 AM

## 2020-07-08 NOTE — Progress Notes (Signed)
STROKE TEAM PROGRESS NOTE   INTERVAL HISTORY Pt is evaluated at the bedside. Mother is present in the room. Mother added the patient drinks at least 1-2 pints of liquor daily. Overnight weaning off sedation in preparation for extubation and became agitated and sedation continued. On Neuro Exam he is intubated, drowsy but following all commands. B/l  eyes conjugate position, pupils equal and reactive to light. Pt can lift all extremities against gravity. Tremors present in b/l upper extremities.  Pt intially admitted with c/o Headache, dizziness, vomiting and progressive AMS. CT Head showed 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 CTA Head and Neck showed no large medium vessel occlusion or stenosis. No  increase in size of the right basal ganglia hemorrhage. Slightly more intraventricular blood particularly in Rt lateral Ventricle than was seen previously. Slight enlargement in ventricular size when compared to yesterday's study. NS consulted Frontal IVC placed on 2/25 by NS. BP controlled by Claviprex drip and labetalol PRN. HbA1c 5.7, LDL 79, K is low at 3.1, Na 145, creatinine 0.90, ECHO Pending, MRI pending. OT/PT Pending. 3% saline discontinued. Replete K per tube.  Vitals:   07/08/20 0500 07/08/20 0600 07/08/20 0800 07/08/20 0817  BP: (!) 129/100 (!) 132/96  (!) 133/101  Pulse: 97 91  85  Resp: 12 12  12   Temp:   98.9 F (37.2 C)   TempSrc:   Axillary   SpO2: 100% 100%  99%  Weight:      Height:       CBC:  Recent Labs  Lab 24-Jul-2020 1900 2020-07-24 1946 07/07/20 0858 07/08/20 0431  WBC 5.9  --  8.1 11.2*  NEUTROABS 3.4  --   --   --   HGB 12.7*   < > 11.3* 11.3*  HCT 38.2*   < > 32.4* 32.7*  MCV 104.9*  --  102.2* 102.2*  PLT 212  --  242 200   < > = values in this interval not displayed.   Basic Metabolic Panel:  Recent Labs  Lab 07/07/20 0858 07/07/20 1611 07/08/20 0431  NA 145  --  140  K 3.1*  --  3.5  CL 106  --  106  CO2 19*   --  23  GLUCOSE 95  --  144*  BUN 5*  --  9  CREATININE 0.76  --  0.63  CALCIUM 8.2*  --  8.7*  MG  --  2.1 1.9  PHOS  --  2.0* 1.1*   Lipid Panel:  Recent Labs  Lab 07/07/20 0523  CHOL 225*  TRIG 68  HDL 132  CHOLHDL 1.7  VLDL 14  LDLCALC 79   HgbA1c:  Recent Labs  Lab 07/07/20 0616  HGBA1C 5.7*   Urine Drug Screen:  Recent Labs  Lab 07/07/20 0125  LABOPIA NONE DETECTED  COCAINSCRNUR NONE DETECTED  LABBENZ POSITIVE*  AMPHETMU NONE DETECTED  THCU NONE DETECTED  LABBARB NONE DETECTED    Alcohol Level  Recent Labs  Lab 07/24/20 1915  ETH 242*    IMAGING past 24 hours MR BRAIN WO CONTRAST  Result Date: 07/07/2020 CLINICAL DATA:  Stroke follow-up. EXAM: MRI HEAD WITHOUT CONTRAST TECHNIQUE: Multiplanar, multiecho pulse sequences of the brain and surrounding structures were obtained without intravenous contrast. COMPARISON:  Head CT 07/24/20. FINDINGS: The study is partially degraded by motion. Brain: Again seen is right basal ganglia/thalamic intraparenchymal hemorrhage decompressing into the ventricular system with large amount of blood within the supratentorial and infratentorial  ventricular system. Left frontal approach ventricular drain with the tip within the frontal horn of the left lateral ventricle with stable size of the ventricular system. No acute infarct or mass lesion identified. Vascular: Normal flow voids. Skull and upper cervical spine: Normal marrow signal. Sinuses/Orbits: Mild mucosal thickening throughout the paranasal sinuses. The orbits are maintained. IMPRESSION: 1. Motion degraded study. 2. Right basal ganglia/thalamic intraparenchymal hemorrhage decompressing into the ventricular system with large amount of blood within the supratentorial and infratentorial ventricular system, stable. 3. Left frontal approach ventricular drain with the tip within the frontal horn of the left lateral ventricle with stable size of the ventricular system.  Electronically Signed   By: Baldemar LenisKatyucia  De Macedo Rodrigues M.D.   On: 07/07/2020 13:29   ECHOCARDIOGRAM COMPLETE  Result Date: 07/07/2020    ECHOCARDIOGRAM REPORT   Patient Name:   Wilnette KalesSHUNTREY Fierro Date of Exam: 07/07/2020 Medical Rec #:  [id number]           Height:       74.0 in Accession #:    [id number]          Weight:       154.1 lb Date of Birth:  06/14/1975            BSA:          1.946 m Patient Age:    45 years            BP:           117/87 mmHg Patient Gender: M                   HR:           101 bpm. Exam Location:  Inpatient Procedure: 2D Echo, Color Doppler and Cardiac Doppler Indications:    Stroke I63.9  History:        Patient has no prior history of Echocardiogram examinations.  Sonographer:    Eulah PontSarah Pirrotta RDCS Referring Phys: [id number] SRISHTI L BHAGAT IMPRESSIONS  1. Left ventricular ejection fraction, by estimation, is 55 to 60%. The left ventricle has normal function. The left ventricle has no regional wall motion abnormalities. Left ventricular diastolic parameters were normal.  2. Right ventricular systolic function is normal. The right ventricular size is normal.  3. The mitral valve is normal in structure. No evidence of mitral valve regurgitation. No evidence of mitral stenosis.  4. The aortic valve is normal in structure. Aortic valve regurgitation is not visualized. No aortic stenosis is present.  5. The inferior vena cava is normal in size with greater than 50% respiratory variability, suggesting right atrial pressure of 3 mmHg. Conclusion(s)/Recommendation(s): No intracardiac source of embolism detected on this transthoracic study. A transesophageal echocardiogram is recommended to exclude cardiac source of embolism if clinically indicated. FINDINGS  Left Ventricle: Left ventricular ejection fraction, by estimation, is 55 to 60%. The left ventricle has normal function. The left ventricle has no regional wall motion abnormalities. The left ventricular internal cavity size was  normal in size. There is  no left ventricular hypertrophy. Left ventricular diastolic parameters were normal. Right Ventricle: The right ventricular size is normal. No increase in right ventricular wall thickness. Right ventricular systolic function is normal. Left Atrium: Left atrial size was normal in size. Right Atrium: Right atrial size was normal in size. Pericardium: There is no evidence of pericardial effusion. Mitral Valve: The mitral valve is normal in structure. No evidence of mitral valve regurgitation. No evidence of mitral valve stenosis.  Tricuspid Valve: The tricuspid valve is normal in structure. Tricuspid valve regurgitation is not demonstrated. No evidence of tricuspid stenosis. Aortic Valve: The aortic valve is normal in structure. Aortic valve regurgitation is not visualized. No aortic stenosis is present. Pulmonic Valve: The pulmonic valve was normal in structure. Pulmonic valve regurgitation is not visualized. No evidence of pulmonic stenosis. Aorta: The aortic root is normal in size and structure. Venous: The inferior vena cava is normal in size with greater than 50% respiratory variability, suggesting right atrial pressure of 3 mmHg. IAS/Shunts: No atrial level shunt detected by color flow Doppler.  LEFT VENTRICLE PLAX 2D LVIDd:         5.30 cm  Diastology LVIDs:         3.60 cm  LV e' medial:    10.00 cm/s LV PW:         0.70 cm  LV E/e' medial:  8.5 LV IVS:        0.60 cm  LV e' lateral:   11.30 cm/s LVOT diam:     2.10 cm  LV E/e' lateral: 7.5 LV SV:         72 LV SV Index:   37 LVOT Area:     3.46 cm  RIGHT VENTRICLE RV S prime:     14.80 cm/s TAPSE (M-mode): 2.2 cm LEFT ATRIUM             Index       RIGHT ATRIUM           Index LA diam:        2.30 cm 1.18 cm/m  RA Area:     11.10 cm LA Vol (A2C):   39.9 ml 20.51 ml/m RA Volume:   25.40 ml  13.06 ml/m LA Vol (A4C):   42.5 ml 21.85 ml/m LA Biplane Vol: 45.6 ml 23.44 ml/m  AORTIC VALVE LVOT Vmax:   144.00 cm/s LVOT Vmean:  94.800  cm/s LVOT VTI:    0.209 m  AORTA Ao Root diam: 3.00 cm Ao Asc diam:  2.80 cm MITRAL VALVE MV Area (PHT): 2.62 cm     SHUNTS MV Decel Time: 290 msec     Systemic VTI:  0.21 m MV E velocity: 85.10 cm/s   Systemic Diam: 2.10 cm MV A velocity: 115.00 cm/s MV E/A ratio:  0.74 Donato Schultz MD Electronically signed by Donato Schultz MD Signature Date/Time: 07/07/2020/12:58:29 PM    Final     PHYSICAL EXAM  GENERAL: Intubated , drowsy but follows all commands.  HEENT: - Normocephalic and atraumatic, dry mucous membranes. LUNGS - Symmetrical chest rise, No labored breathing noted CV - no JVD, No Peripheral Edema ABDOMEN - Soft,  nondistended  Ext: warm, well perfused, intact peripheral pulses, no Peripheral edema, B/L tremors present  NEURO Exam:  Remains relatively unchanged. Mental Status: Intubated, drowsy but follows all commands.  Language: speech not tested as Pt is intubated.  Cranial Nerves:   CN II Pupils equal and reactive to light, no VF deficits.   CN III,IV,VI EOM intact, no gaze preference or deviation, no nystagmus.   CN V normal sensation in V1, V2, and V3 segments bilaterally.   CN VII Facial symmetry cannot be tested as Pt is intubated, no nasolabial fold flattening   CN VIII normal hearing to speech.   CN IX & X Cannot be tested as Pt is intubated   CN XI 5/5 head turn and 5/5 shoulder shrug bilaterally.   CN XII midline  tongue protrusion   Motor: No Drift in Upper Extremities, No Drift in LE's. Strength at least 3+ in B/L LE. 4+/5 in B/L UE Tone: is normal and bulk is normal. Sensation- Intact to light touch b/l Coordination: FTN intact bilaterally, no ataxia , Tremors present B/L upper extremities. Gait- deferred.  ASSESSMENT/PLAN [name] is a 45 y.o. male history of heavy alcohol, abuse, tobacco abuse (3 to 40 cigarettes daily), TIA presented with Headache, dizziness, vomiting and progressive decreased level of consciousness and a fall. Ct Head showed 2.0 cm  right basal ganglia/thalamic hemorrhage with intraventricular extension concerning for hydrocephalus - s/p EVD placement. CTA Head and Neck showed no large medium vessel occlusion or stenosis. No  increase in size of the right basal ganglia hemorrhage. Slightly more intraventricular blood particularly in Rt lateral Ventricle than was seen previously. BP controlled by Claviprex drip and labetalol PRN. HbA1c 5.7, LDL 79, ECHO Pending, MRI pending. OT/PT Pending. Etiology of ICH and IVH is likely due to hypertension with risk factors of alcohol and smoking. MRI showed stable hematoma, blood pressure goal will relax to less than 160. Continue Cleviprex, will add p.o. BP meds.  EVD per neurosurgery.  CIWA protocol, avoid DT.  On B1/FA/multivitamin.  Extubate when able.  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 There is new mild to moderate pan ventricular dilatation compared to prior exam.  CTA head & neck - No large or medium vessel occlusion or stenosis. No  increase in size of the right basal ganglia hemorrhage. Slight enlargement in ventricular size when compared to yesterday's study.  MRI stable hematoma and IVH, stable size of ventricular system  2D Echo EF 55-60%  LDL 79  HgbA1c 5.7  VTE prophylaxis - SCD's now (due to ICH)  No antithrombotic prior to admission, now on No antithrombotic due to ICH  Therapy recommendations:  Pending  Disposition:  TBD  Hydrocephalous  Was on 3% Normal Saline>off now.  NSG on board  IVC placed on 2/25  ICP not high  Hypertension  Home meds:  None  Stable on cleviprex . BP goal systolic <160 mmHg . Long-term BP goal normotensive . Labetalol PRN  . On Claviprex . on Amlodipine 10 mg daily per tube.   Hyperlipidemia  Home meds:  None.  LDL 79, goal < 70  Consider atorvastatin  on  discharge.  Smoker  Uses 3-40 cigarettes /day. Cigar use.  May offer Nicotine patch when stabalize.  Smoking cessation education will be provided.   Alcohol Use  Heavy daily alcohol/liquor use.  Continue Thiamine/MVI/ Folic acid  CIWA protocol.  Hypokalemia  resolved.   Other Stroke Risk Factors  Hx of TIA  Hx of seizure - alcohol related ??  Hospital day # 2  Marisue Humble, MD Page: [phone]   This patient is critically ill due to ICH, IVH and hydrocephalus and at significant risk of neurological worsening, death form obstructive hydrocephalus, cerebral edema, brain herniation, seizure, DT. This patient's care requires constant monitoring of vital signs, hemodynamics, respiratory and cardiac monitoring, review of multiple databases, neurological assessment, discussion with family, other specialists and medical decision making of high complexity. I spent 40 minutes of neurocritical care time in the care of this patient. I had long discussion with mom at bedside, updated pt current condition, treatment plan and potential prognosis, and  answered all the questions.  She expressed understanding and appreciation.     To contact Stroke Continuity provider, please refer to [email or web address]. After hours, contact General Neurology

## 2020-07-08 NOTE — Evaluation (Signed)
Physical Therapy Evaluation Patient Details Name: Charles Aguilar MRN: [id number] DOB: 1976-04-01 Today's Date: 07/08/2020   History of Present Illness  The pt is a 45 yo male presenting from home with headache, dizziness, and progressive altered mentation after multiple syncopal episodes. 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 Pt intubated after arrival (2/24) and extubated 2/26 with placement of L frontal IVC on 2/25.  Clinical Impression  Pt in bed upon arrival of PT, agreeable to evaluation at this time. Prior to admission the pt was independent with all mobility, living with his girlfriend and 62 yo daughter. The pt now presents with limitations in functional mobility, strength, stability, coordination, force production, and stability due to above dx, and will continue to benefit from skilled PT to address these deficits. The pt was able to demo good initiation and movement to complete bed mobility, but required increased cues for positioning and use of LUE as well as maxA to complete sit-stand transfers due to significant posterior lean with initial stand. The pt was able to take small lateral steps to recliner, but required maxA to manage facilitation at hips for each step as well as to provide support and steady with upright mobility. The pt will continue to benefit from skilled PT to progress strength and tolerance for OOB mobility to facilitate return to pt's prior level of mobility and independence.       Follow Up Recommendations CIR    Equipment Recommendations   (defer to post acute)    Recommendations for Other Services Rehab consult     Precautions / Restrictions Precautions Precautions: Fall Precaution Comments: IVC needs to be clamped for mobiltiy Restrictions Weight Bearing Restrictions: No      Mobility  Bed Mobility Overal bed mobility: Needs Assistance Bed Mobility: Supine to Sit     Supine to sit: Min guard      General bed mobility comments: pt transitioned supine to long sit without assist, able to pivot to sitting EOB with increased cues and increased time. No physical assist    Transfers Overall transfer level: Needs assistance Equipment used: 1 person hand held assist Transfers: Sit to/from [employer] Transfers Sit to Stand: Max assist;Mod assist Stand pivot transfers: Max assist       General transfer comment: maxA to stand initially, progressed to modA with second attempt. Pt with strong posterior lean initially able to correct with cues. maxA to manage small pivoting steps to recliner. facilitation at hips for steps  Ambulation/Gait Ambulation/Gait assistance: Max assist Gait Distance (Feet): 3 Feet Assistive device: 1 person hand held assist Gait Pattern/deviations: Step-to pattern;Decreased stride length;Trunk flexed Gait velocity: decreased Gait velocity interpretation: <1.31 ft/sec, indicative of household ambulator General Gait Details: pt with small pivoting steps to recliner, able to complete with facilitation at hips to cue for steps and maxA at trunk to support     Modified Rankin (Stroke Patients Only) Modified Rankin (Stroke Patients Only) Pre-Morbid Rankin Score: No symptoms Modified Rankin: Moderately severe disability     Balance Overall balance assessment: Needs assistance Sitting-balance support: No upper extremity supported;Feet supported Sitting balance-Leahy Scale: Fair   Postural control: Posterior lean Standing balance support: Bilateral upper extremity supported Standing balance-Leahy Scale: Poor Standing balance comment: relies on UE support                             Pertinent Vitals/Pain Pain Assessment: Faces Faces Pain Scale: Hurts a little bit Pain  Location: posterior neck Pain Descriptors / Indicators: Discomfort Pain Intervention(s): Monitored during session;Repositioned    Home Living Family/patient expects to be  discharged to:: Private residence Living Arrangements: Spouse/significant other;Children Available Help at Discharge: Family;Available 24 hours/day (pt can stay with his mother at d/c) Type of Home: House Home Access: Level entry     Home Layout: 1/2 bath on main level;Able to live on main level with bedroom/bathroom Home Equipment: None Additional Comments: pt lives with his girlfriend and 9yo daughter in home with level entry but bed/bath upstairs. Pt mother present and states pt can stay with her at d/c. she has a hospital bed on first floor    Prior Function Level of Independence: Independent         Comments: independent, driving     Hand Dominance   Dominant Hand: Right    Extremity/Trunk Assessment   Upper Extremity Assessment Upper Extremity Assessment: Generalized weakness;LUE deficits/detail;Defer to OT evaluation LUE Deficits / Details: requires increased cues and time to use, but eventually able to generate good force production. shoulder AROM limited to ~60 deg flexion, full PROM LUE Sensation: WNL (pt states no difference in sensation) LUE Coordination: decreased fine motor;decreased gross motor    Lower Extremity Assessment Lower Extremity Assessment: Generalized weakness;LLE deficits/detail LLE Deficits / Details: increased time and cues to generate force production, but eventually able to generate and maintain. Pt denies difference in sensation LLE Coordination: decreased fine motor;decreased gross motor    Cervical / Trunk Assessment Cervical / Trunk Assessment: Kyphotic  Communication   Communication: No difficulties  Cognition Arousal/Alertness: Awake/alert Behavior During Therapy: Flat affect Overall Cognitive Status: Impaired/Different from baseline Area of Impairment: Orientation;Memory;Following commands;Safety/judgement;Awareness;Problem solving                 Orientation Level: Disoriented to;Place;Time;Situation   Memory: Decreased  recall of precautions;Decreased short-term memory Following Commands: Follows one step commands consistently;Follows one step commands with increased time Safety/Judgement: Decreased awareness of safety;Decreased awareness of deficits Awareness: Intellectual Problem Solving: Slow processing;Decreased initiation;Difficulty sequencing;Requires verbal cues General Comments: pt with slowed processing, requiring verbal cues for technique, safety, and sequencing through session. Pt thinks he is at a condo, shocked that he is in hospital with no recollection of events to bring him here. Pt unaware of mother's presence even through she states she has talked to him multiple times today.      General Comments General comments (skin integrity, edema, etc.): HR and SpO2 stable on RA, EVD clamped by RN. slight elevation in BP to systolic of 140s, RN present and aware    Exercises     Assessment/Plan    PT Assessment Patient needs continued PT services  PT Problem List Decreased strength;Decreased activity tolerance;Decreased balance;Decreased mobility;Decreased coordination;Decreased safety awareness;Decreased cognition       PT Treatment Interventions DME instruction;Gait training;Stair training;Therapeutic exercise;Functional mobility training;Therapeutic activities;Balance training;Neuromuscular re-education;Cognitive remediation;Patient/family education    PT Goals (Current goals can be found in the Care Plan section)  Acute Rehab PT Goals Patient Stated Goal: return to "normal" PT Goal Formulation: With patient Time For Goal Achievement: 07/22/20 Potential to Achieve Goals: Good    Frequency Min 4X/week    AM-PAC PT "6 Clicks" Mobility  Outcome Measure Help needed turning from your back to your side while in a flat bed without using bedrails?: A Little Help needed moving from lying on your back to sitting on the side of a flat bed without using bedrails?: A Little Help needed moving to  and  from a bed to a chair (including a wheelchair)?: A Lot Help needed standing up from a chair using your arms (e.g., wheelchair or bedside chair)?: A Lot Help needed to walk in hospital room?: A Lot Help needed climbing 3-5 steps with a railing? : Total 6 Click Score: 13    End of Session Equipment Utilized During Treatment: Gait belt Activity Tolerance: Patient tolerated treatment well Patient left: in chair;with chair alarm set;with call bell/phone within reach;with family/visitor present Nurse Communication: Mobility status PT Visit Diagnosis: Unsteadiness on feet (R26.81);Other abnormalities of gait and mobility (R26.89)    Time: 0037-0488 PT Time Calculation (min) (ACUTE ONLY): 39 min   Charges:   PT Evaluation $PT Eval Moderate Complexity: 1 Mod PT Treatments $Therapeutic Activity: 23-37 mins        Rolm Baptise, PT, DPT   Acute Rehabilitation Department Pager #: [phone]  [name] 07/08/2020, 4:14 PM

## 2020-07-08 NOTE — Procedures (Signed)
Extubation Procedure Note  Patient Details:   Name: Ruger Saxer DOB: 12/18/1975 MRN: [id number]   Airway Documentation:    Vent end date: 07/08/20 Vent end time: 1335   Evaluation  O2 sats: stable throughout Complications: No apparent complications Patient did tolerate procedure well. Bilateral Breath Sounds: Clear,Diminished   Yes   RT extubated patient to 4L [place] per MD order with RN at bedside. Positive cuff leak noted. Patient tolerated well. No stridor noted at this time. RT will continue to monitor as needed.   Jaquelyn Bitter 07/08/2020, 1:41 PM

## 2020-07-08 NOTE — Progress Notes (Addendum)
OT Cancellation Note  Patient Details Name: Charles Aguilar MRN: [id number] DOB: February 10, 1976   Cancelled Treatment:    Reason Eval/Treat Not Completed: Active bedrest order (Pt desatting and at CT chest. RN hold. OT to continue to follow for OT evaluation.)    Flora Lipps, OTR/L Acute Rehabilitation Services Pager: [phone] Office: [phone]   Charles Aguilar C 07/08/2020, 11:33 AM

## 2020-07-08 NOTE — Progress Notes (Signed)
K 3.5, Phos 1.1, Mg 1.9 Electrolytes replaced per protocol

## 2020-07-08 NOTE — Progress Notes (Signed)
NAME:  Charles Aguilar, MRN:  [id number], DOB:  April 02, 1976, LOS: 2 ADMISSION DATE:  07/04/2020, CONSULTATION DATE:  2/24 REFERRING MD:  Dr. Iver Nestle, CHIEF COMPLAINT:  ICH   Brief History:  45 year old male presenting with headache and AMS found to have ICH and hypertensive emergency.   History of Present Illness:  45 year old male with PMH as below, which is significant for alcoholism. He reportedly drinks vodka continuously. Family does not have an estimate on actual volume. He was in his usual state of health until 2/24 when he arrived to pick his brother up from work while complaining of a headache and dizziness. His brother drove home and the patient slept. Upon arriving home, the patient began to vomit and fell out of the vehicle striking the right side of his face. It was at this point his brother called EMS who transported the patient to [facility] ED. CT scan was done in the ED and demonstrated R ICH with IVF and ventricular dilation. Neurosurgery and neurology consulted. The patient was started on clevidipine for blood pressure management and was scheduled for CT angiogram of the head. PCCM was asked to admit due to poor mentation and concern for poor airway protection.    Past Medical History:   has a past medical history of Epilepsy (HCC).   Significant Hospital Events:  2/24 admit for ICH  Consults:  Neurology Neurosurgery  Procedures:    Significant Diagnostic Tests:  CT head 2/24 > 2.0 cm right basal ganglia/thalamic hemorrhage with intraventricular Extension. Upon further review there is new mild to moderate panventricular dilatation compared to prior exam. MRI Brain 2/25 >>> 1. Motion degraded study. 2. Right basal ganglia/thalamic intraparenchymal hemorrhage decompressing into the ventricular system with large amount of blood within the supratentorial and infratentorial ventricular system, stable. 3. Left frontal approach ventricular drain with the tip within  the frontal horn of the left lateral ventricle with stable size of the ventricular system.  Micro Data:    Antimicrobials:    Interim History / Subjective:  Intubated, mother at bedside. He is on propofol but following commands.   Objective   Blood pressure (!) 133/101, pulse 85, temperature 98.9 F (37.2 C), temperature source Axillary, resp. rate 12, height 6\' 2"  (1.88 m), weight 70.1 kg, SpO2 99 %.    Vent Mode: PRVC FiO2 (%):  [40 %] 40 % Set Rate:  [12 bmp] 12 bmp Vt Set:  [600 mL] 600 mL PEEP:  [5 cmH20] 5 cmH20 Plateau Pressure:  [17 cmH20-19 cmH20] 19 cmH20   Intake/Output Summary (Last 24 hours) at 07/08/2020 1057 Last data filed at 07/08/2020 0600 Gross per 24 hour  Intake 2161.53 ml  Output 1337 ml  Net 824.53 ml   Filed Weights   07/07/20 0731 07/08/20 0400  Weight: 69.9 kg 70.1 kg    Examination: Intubated, sedated Opens eyes to voice Follows commands  Labs reviewed personally Na 140, K 3.5, Cr 0.63, Phos 1.1  Chest xray 2/25 reviewed Hyperinflation, ETT in place   Resolved Hospital Problem list     Assessment & Plan:   Intracranial hemorrhage: R basal ganglia/thalamic.  - CTA and MRI competed  - neurology, neurosurgery following.  - Hypertonic saline per neurology - Serial sodium check - Frequent neuro checks - clevidipine infusion to keep SBP < 3/25 - Hypertonic saline per neurology . No CVL required at current infusion rate - maintain arterial line for BP monitoring  Acute hypoxemic respiratory failure due to poor  mentation and severe aspiration risk - maintain  Full vent support - LTVV - will perform SAT and SBT today and evaluate readiness for extubation. May need to switch propofol to precedex.   Hypertensive crisis - Clevidipine as above. Will titrate down as tolerated.  - continue amlodipine, may need secondary agent - coreg added today - ICU hemodynamic monitoring.   Seizure history: no home medications documented.  -  seizure precautions - potentially in the setting of prior etoh use?  Alcohol abuse: Alcohol level 242 on admission - folic acid - thiamine - MVI - No current need for CIWA - observe for signs of withdrawal  Transaminitis: likely secondary to alcohol  Hyperammonemia - monitor Ammonia levels - no prior history of hepatic encephaloapthy - consider lactulose if not improving.  Hypophosphatemia - will replete  Best practice (evaluated daily)  Diet: continue tube feeds Pain/Anxiety/Delirium protocol (if indicated): NA VAP protocol (if indicated): NA DVT prophylaxis: SCD GI prophylaxis: PPI Glucose control: NA Mobility: BR Disposition:ICU  Goals of Care:  Last date of multidisciplinary goals of care discussion: 2/25 Family and staff present: RN, patient's mother  Summary of discussion: maintaining full vent support, awaiting neurologic recovery.  Follow up goals of care discussion due: 3/3 Code Status: FULL  Labs   CBC: Recent Labs  Lab 2020/07/28 1900 2020-07-28 1946 28-Jul-2020 2007 07/07/20 0010 07/07/20 0406 07/07/20 0858 07/08/20 0431  WBC 5.9  --   --   --   --  8.1 11.2*  NEUTROABS 3.4  --   --   --   --   --   --   HGB 12.7*   < > 13.3 11.9* 11.9* 11.3* 11.3*  HCT 38.2*   < > 39.0 35.0* 35.0* 32.4* 32.7*  MCV 104.9*  --   --   --   --  102.2* 102.2*  PLT 212  --   --   --   --  242 200   < > = values in this interval not displayed.    Basic Metabolic Panel: Recent Labs  Lab Jul 28, 2020 1900 July 28, 2020 1946 07/28/20 2007 07/28/20 2105 07/07/20 0010 07/07/20 0325 07/07/20 0406 07/07/20 0858 07/07/20 1611 07/08/20 0431  NA 142   < > 139   < > 143 142 145 145  --  140  K 3.6   < > 4.6  --  3.1*  --  3.1* 3.1*  --  3.5  CL 102  --  108  --   --   --   --  106  --  106  CO2 24  --   --   --   --   --   --  19*  --  23  GLUCOSE 90  --  88  --   --   --   --  95  --  144*  BUN 6  --  7  --   --   --   --  5*  --  9  CREATININE 0.70  --  0.90  --   --   --   --   0.76  --  0.63  CALCIUM 8.9  --   --   --   --   --   --  8.2*  --  8.7*  MG 1.3*  --   --   --   --   --   --   --  2.1 1.9  PHOS  --   --   --   --   --   --   --   --  2.0* 1.1*   < > = values in this interval not displayed.   GFR: Estimated Creatinine Clearance: 116.8 mL/min (by C-G formula based on SCr of 0.63 mg/dL). Recent Labs  Lab July 22, 2020 1900 07/07/20 0858 07/08/20 0431  WBC 5.9 8.1 11.2*    Liver Function Tests: Recent Labs  Lab 07/22/2020 1900  AST 129*  ALT 83*  ALKPHOS 114  BILITOT 0.6  PROT 8.0  ALBUMIN 3.6   No results for input(s): LIPASE, AMYLASE in the last 168 hours. Recent Labs  Lab July 22, 2020 1933  AMMONIA 59*    ABG    Component Value Date/Time   PHART 7.522 (H) 07/07/2020 0406   PCO2ART 25.2 (L) 07/07/2020 0406   PO2ART 98 07/07/2020 0406   HCO3 20.7 07/07/2020 0406   TCO2 21 (L) 07/07/2020 0406   ACIDBASEDEF 1.0 07/07/2020 0406   O2SAT 98.0 07/07/2020 0406     Coagulation Profile: Recent Labs  Lab 07/07/20 0140  INR 1.1    Cardiac Enzymes: No results for input(s): CKTOTAL, CKMB, CKMBINDEX, TROPONINI in the last 168 hours.  HbA1C: Hgb A1c MFr Bld  Date/Time Value Ref Range Status  07/07/2020 06:16 AM 5.7 (H) 4.8 - 5.6 % Final    Comment:    (NOTE) Pre diabetes:          5.7%-6.4%  Diabetes:              >6.4%  Glycemic control for   <7.0% adults with diabetes    The patient is critically ill due to respiratory failure, intracranial hemorrhage.  Critical care was necessary to treat or prevent imminent or life-threatening deterioration.  Critical care was time spent personally by me on the following activities: development of treatment plan with patient and/or surrogate as well as nursing, discussions with consultants, evaluation of patient's response to treatment, examination of patient, obtaining history from patient or surrogate, ordering and performing treatments and interventions, ordering and review of laboratory studies,  ordering and review of radiographic studies, pulse oximetry, re-evaluation of patient's condition and participation in multidisciplinary rounds.   Critical Care Time devoted to patient care services described in this note is 34 minutes. This time reflects time of care of this signee Charlott Holler . This critical care time does not reflect separately billable procedures or procedure time, teaching time or supervisory time of PA/NP/Med student/Med Resident etc but could involve care discussion time.       Mickel Baas [facility] 07/08/2020 10:57 AM  Pager: see AMION After hours pager: [phone]  If no response to pager , please call [phone] until 7pm After 7:00 pm call Elink  [phone]

## 2020-07-08 NOTE — Progress Notes (Signed)
Inpatient Rehab Admissions Coordinator Note:   Pt was screened for CIR candidacy by Wolfgang Phoenix, MS, CCC-SLP.  At this time we are not recommending an inpatient rehab consult as there are no therapy notes in chart. Will continue to follow for pt's tolerance and progress with therapy as well as therapy discharge recommendations.   Please contact me with questions.    Wolfgang Phoenix, MS, CCC-SLP Admissions Coordinator [phone] 07/08/20 4:46 PM

## 2020-07-08 NOTE — Progress Notes (Signed)
Patient ID: Charles Aguilar, male   DOB: [birth year], 45 y.o.   MRN: [id number] Vital signs are stable Patient is opening his eyes and following some commands IVC drainage is minimal but still modest Undergoing weaning of Precedex in preparation for extubation Continue supportive care

## 2020-07-08 NOTE — Progress Notes (Signed)
PT Cancellation Note  Patient Details Name: Charles Aguilar MRN: [id number] DOB: [birth year]   Cancelled Treatment:    Reason Eval/Treat Not Completed: Active bedrest order this morning. PT will continue to follow and evaluate as appropriate.   [name], DPT   Acute Rehabilitation Department Pager #: [phone]   [name] 07/08/2020, 7:33 AM

## 2020-07-09 DIAGNOSIS — E876 Hypokalemia: Secondary | ICD-10-CM

## 2020-07-09 LAB — CBC
HCT: 37.2 % — ABNORMAL LOW (ref 39.0–52.0)
Hemoglobin: 12.4 g/dL — ABNORMAL LOW (ref 13.0–17.0)
MCH: 34.9 pg — ABNORMAL HIGH (ref 26.0–34.0)
MCHC: 33.3 g/dL (ref 30.0–36.0)
MCV: 104.8 fL — ABNORMAL HIGH (ref 80.0–100.0)
Platelets: 184 10*3/uL (ref 150–400)
RBC: 3.55 MIL/uL — ABNORMAL LOW (ref 4.22–5.81)
RDW: 13.5 % (ref 11.5–15.5)
WBC: 16.4 10*3/uL — ABNORMAL HIGH (ref 4.0–10.5)
nRBC: 0 % (ref 0.0–0.2)

## 2020-07-09 LAB — BASIC METABOLIC PANEL
Anion gap: 16 — ABNORMAL HIGH (ref 5–15)
BUN: 7 mg/dL (ref 6–20)
CO2: 22 mmol/L (ref 22–32)
Calcium: 8.8 mg/dL — ABNORMAL LOW (ref 8.9–10.3)
Chloride: 105 mmol/L (ref 98–111)
Creatinine, Ser: 0.66 mg/dL (ref 0.61–1.24)
GFR, Estimated: >60 mL/min (ref >60)
Glucose, Bld: 98 mg/dL (ref 70–99)
Potassium: 3.2 mmol/L — ABNORMAL LOW (ref 3.5–5.1)
Sodium: 143 mmol/L (ref 135–145)

## 2020-07-09 LAB — MAGNESIUM: Magnesium: 1.9 mg/dL (ref 1.7–2.4)

## 2020-07-09 MED ORDER — DEXMEDETOMIDINE HCL IN NACL 400 MCG/100ML IV SOLN
0.0000 ug/kg/h | INTRAVENOUS | Status: DC
  Administered 2020-07-09: 0.2 ug/kg/h via INTRAVENOUS
  Administered 2020-07-10 (×2): 0.7 ug/kg/h via INTRAVENOUS
  Administered 2020-07-11: 0.8 ug/kg/h via INTRAVENOUS
  Administered 2020-07-11: 0.5 ug/kg/h via INTRAVENOUS
  Filled 2020-07-09 (×6): qty 100

## 2020-07-09 MED ORDER — STERILE WATER FOR INJECTION IJ SOLN
INTRAMUSCULAR | Status: AC
  Filled 2020-07-09: qty 10

## 2020-07-09 MED ORDER — POTASSIUM CHLORIDE 20 MEQ PO PACK: 20.0000 meq | PACK | ORAL | Status: DC

## 2020-07-09 MED ORDER — ONDANSETRON HCL 4 MG/2ML IJ SOLN: 4.0000 mg | INTRAMUSCULAR | Status: DC | PRN

## 2020-07-09 MED ORDER — POTASSIUM CHLORIDE 10 MEQ/100ML IV SOLN
10.0000 meq | INTRAVENOUS | Status: AC
  Administered 2020-07-09 (×4): 10 meq via INTRAVENOUS
  Filled 2020-07-09 (×4): qty 100

## 2020-07-09 MED ORDER — MAGNESIUM SULFATE 2 GM/50ML IV SOLN
2.0000 g | Freq: Once | INTRAVENOUS | Status: AC
  Administered 2020-07-09: 2 g via INTRAVENOUS
  Filled 2020-07-09: qty 50

## 2020-07-09 MED ORDER — POTASSIUM CHLORIDE 20 MEQ PO PACK
20.0000 meq | PACK | ORAL | Status: AC
  Administered 2020-07-09 (×2): 20 meq via ORAL
  Filled 2020-07-09 (×2): qty 1

## 2020-07-09 NOTE — Progress Notes (Addendum)
NAME:  Charles Aguilar, MRN:  [id number], DOB:  13-Nov-1975, LOS: 3 ADMISSION DATE:  07/08/2020, CONSULTATION DATE:  2/24 REFERRING MD:  Dr. Iver Nestle, CHIEF COMPLAINT:  ICH   Brief History:  45 year old male presenting with headache and AMS found to have ICH and hypertensive emergency.   History of Present Illness:  45 year old male with PMH as below, which is significant for alcoholism. He reportedly drinks vodka continuously. Family does not have an estimate on actual volume. He was in his usual state of health until 2/24 when he arrived to pick his brother up from work while complaining of a headache and dizziness. His brother drove home and the patient slept. Upon arriving home, the patient began to vomit and fell out of the vehicle striking the right side of his face. It was at this point his brother called EMS who transported the patient to [facility] ED. CT scan was done in the ED and demonstrated R ICH with IVF and ventricular dilation. Neurosurgery and neurology consulted. The patient was started on clevidipine for blood pressure management and was scheduled for CT angiogram of the head. PCCM was asked to admit due to poor mentation and concern for poor airway protection.    Past Medical History:   has a past medical history of Epilepsy (HCC).   Significant Hospital Events:  2/24 admit for ICH  Consults:  Neurology Neurosurgery  Procedures:    Significant Diagnostic Tests:  CT head 2/24 > 2.0 cm right basal ganglia/thalamic hemorrhage with intraventricular Extension. Upon further review there is new mild to moderate panventricular dilatation compared to prior exam. MRI Brain 2/25 >>> 1. Motion degraded study. 2. Right basal ganglia/thalamic intraparenchymal hemorrhage decompressing into the ventricular system with large amount of blood within the supratentorial and infratentorial ventricular system, stable. 3. Left frontal approach ventricular drain with the tip within  the frontal horn of the left lateral ventricle with stable size of the ventricular system.  Micro Data:    Antimicrobials:    Interim History / Subjective:  This morning resting comfortably, following commands, wants to keep eyes closed and rest.   Objective   Blood pressure (!) 143/102, pulse 80, temperature 98.3 F (36.8 C), temperature source Axillary, resp. rate 18, height 6\' 2"  (1.88 m), weight 70.1 kg, SpO2 99 %.    Vent Mode: CPAP;PSV FiO2 (%):  [40 %-60 %] 40 % Set Rate:  [12 bmp] 12 bmp Vt Set:  [600 mL] 600 mL PEEP:  [5 cmH20] 5 cmH20 Pressure Support:  [8 cmH20] 8 cmH20   Intake/Output Summary (Last 24 hours) at 07/09/2020 1000 Last data filed at 07/09/2020 0600 Gross per 24 hour  Intake 1121.86 ml  Output 630 ml  Net 491.86 ml   Filed Weights   07/07/20 0731 07/08/20 0400  Weight: 69.9 kg 70.1 kg    Examination: Resting comfortably, no distress  Labs reviewed personally Na 143, K 3.2, Cr 0.66, Hgb 12.4, WBC 16.4  Resolved Hospital Problem list    Respiratory failure  Assessment & Plan:   Intracranial hemorrhage: R basal ganglia/thalamic.  - CTA and MRI competed  - neurology, neurosurgery following.  - Serial sodium check, frequent neurochecks - off HTS, off cleviprex - consider d/c A-line  Hypertensive Emergency - continue amlodipine, coreg  - ICU hemodynamic monitoring.   Seizure history: no home medications documented.  - seizure precautions, no seizures here on admission. - potentially in the setting of prior etoh use?  Alcohol abuse: Alcohol  level 242 on admission - folic acid - thiamine - MVI - on rexamination this afternoon patient agitated and in alcohol withdrawal start precedex for withdrawal symptoms and re-initiate ciwa  Transaminitis: likely secondary to alcohol  Hypophosphatemia Hypokalemia - will replete and recheck BMET  Best practice (evaluated daily)  Diet: continue tube feeds Pain/Anxiety/Delirium protocol (if  indicated): NA VAP protocol (if indicated): NA DVT prophylaxis: SCD GI prophylaxis: PPI Glucose control: NA Mobility: BR Disposition: ICU for now for neurochecks and BP monitoring.  Code status: Full  The patient is critically ill due to acute alcohol withdrawal, hypertensive crisis.  Critical care was necessary to treat or prevent imminent or life-threatening deterioration.  Critical care was time spent personally by me on the following activities: development of treatment plan with patient and/or surrogate as well as nursing, discussions with consultants, evaluation of patient's response to treatment, examination of patient, obtaining history from patient or surrogate, ordering and performing treatments and interventions, ordering and review of laboratory studies, ordering and review of radiographic studies, pulse oximetry, re-evaluation of patient's condition and participation in multidisciplinary rounds.   Critical Care Time devoted to patient care services described in this note is 35 minutes. This time reflects time of care of this signee Charlott Holler . This critical care time does not reflect separately billable procedures or procedure time, teaching time or supervisory time of PA/NP/Med student/Med Resident etc but could involve care discussion time.       Charlott Holler [place] [facility] 07/09/2020 2:10 PM  Pager: see AMION After hours pager: [phone]  If no response to pager , please call [phone] until 7pm After 7:00 pm call Elink  [phone]      Labs   CBC: Recent Labs  Lab 06/30/2020 1900 06/30/2020 1946 07/07/20 0010 07/07/20 0406 07/07/20 0858 07/08/20 0431 07/09/20 0425  WBC 5.9  --   --   --  8.1 11.2* 16.4*  NEUTROABS 3.4  --   --   --   --   --   --   HGB 12.7*   < > 11.9* 11.9* 11.3* 11.3* 12.4*  HCT 38.2*   < > 35.0* 35.0* 32.4* 32.7* 37.2*  MCV 104.9*  --   --   --  102.2* 102.2* 104.8*  PLT 212  --   --   --  242 200  184   < > = values in this interval not displayed.    Basic Metabolic Panel: Recent Labs  Lab 06/15/2020 1900 06/30/2020 1946 07/03/2020 2007 07/04/2020 2105 07/07/20 0010 07/07/20 0325 07/07/20 0406 07/07/20 0858 07/07/20 1611 07/08/20 0431 07/08/20 2114 07/09/20 0425  NA 142   < > 139   < > 143 142 145 145  --  140  --  143  K 3.6   < > 4.6  --  3.1*  --  3.1* 3.1*  --  3.5  --  3.2*  CL 102  --  108  --   --   --   --  106  --  106  --  105  CO2 24  --   --   --   --   --   --  19*  --  23  --  22  GLUCOSE 90  --  88  --   --   --   --  95  --  144*  --  98  BUN 6  --  7  --   --   --   --  5*  --  9  --  7  CREATININE 0.70  --  0.90  --   --   --   --  0.76  --  0.63  --  0.66  CALCIUM 8.9  --   --   --   --   --   --  8.2*  --  8.7*  --  8.8*  MG 1.3*  --   --   --   --   --   --   --  2.1 1.9  --   --   PHOS  --   --   --   --   --   --   --   --  2.0* 1.1* 3.1  --    < > = values in this interval not displayed.   GFR: Estimated Creatinine Clearance: 116.8 mL/min (by C-G formula based on SCr of 0.66 mg/dL). Recent Labs  Lab 07/07/2020 1900 07/07/20 0858 07/08/20 0431 07/09/20 0425  WBC 5.9 8.1 11.2* 16.4*    Liver Function Tests: Recent Labs  Lab 06/23/2020 1900  AST 129*  ALT 83*  ALKPHOS 114  BILITOT 0.6  PROT 8.0  ALBUMIN 3.6   No results for input(s): LIPASE, AMYLASE in the last 168 hours. Recent Labs  Lab 06/25/2020 1933  AMMONIA 59*    ABG    Component Value Date/Time   PHART 7.522 (H) 07/07/2020 0406   PCO2ART 25.2 (L) 07/07/2020 0406   PO2ART 98 07/07/2020 0406   HCO3 20.7 07/07/2020 0406   TCO2 21 (L) 07/07/2020 0406   ACIDBASEDEF 1.0 07/07/2020 0406   O2SAT 98.0 07/07/2020 0406     Coagulation Profile: Recent Labs  Lab 07/07/20 0140  INR 1.1    Cardiac Enzymes: No results for input(s): CKTOTAL, CKMB, CKMBINDEX, TROPONINI in the last 168 hours.  HbA1C: Hgb A1c MFr Bld  Date/Time Value Ref Range Status  07/07/2020 06:16 AM 5.7 (H)  4.8 - 5.6 % Final    Comment:    (NOTE) Pre diabetes:          5.7%-6.4%  Diabetes:              >6.4%  Glycemic control for   <7.0% adults with diabetes

## 2020-07-09 NOTE — Progress Notes (Signed)
Inpatient Rehab Admissions:  Inpatient Rehab Consult received.  I met with patient and mother, Freda Munro at the bedside for rehabilitation assessment and to discuss goals and expectations of an inpatient rehab admission.  Pt lethargic so spoke mainly with mother.  She acknowledged understanding.  She is interested in pt pursuing CIR. She told AC that she, her daughter, and sister will be available to assist pt after hospital discharge.  She also asked if [facility] would contact a Development worker, community. Called Kenda Revels and left message.  Will continue to follow.    Signed: Gayland Curry, [place], [place] Admissions Coordinator [phone]

## 2020-07-09 NOTE — Progress Notes (Signed)
Inpatient Rehab Admissions Coordinator Note:   Per PT recommendation, pt was rescreened for CIR candidacy by Wolfgang Phoenix, MS, CCC-SLP.  At this time we are recommending an inpatient rehab consult.  AC will contact attending to request consult order.  Please contact me with questions.    Wolfgang Phoenix, MS, CCC-SLP Admissions Coordinator [phone] 07/09/20 8:53 AM

## 2020-07-09 NOTE — Consult Note (Signed)
Physical Medicine and Rehabilitation Consult Reason for Consult: Altered mental status with dizziness and headache Referring Physician: Dr.Xu   HPI: Charles Aguilar is a 45 y.o. right-handed male with history of alcohol and tobacco use.  According to mother, patient reportedly drinking 3 to 4 pints of hard liquor per day. Per mom, patient lives with girlfriend and 44-year-old daughter.  Independent prior to admission.  Two-level home with bed and bath main level.  He also has a mother with good support.  Presented 07/04/2020 with complaints of headache and lethargy.  Patient noted multiple episodes of vomiting.  Admission chemistries unremarkable except hemoglobin 12.7, AST 129, ALT 83, alcohol 242 ammonia 59, urine drug screen positive benzos.  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  No midline shift or ventriculomegaly.  CT angiogram of head and neck no large or medium vessel occlusion or correctable proximal stenosis.  Echocardiogram with ejection fraction of 55 to 60% no wall motion abnormalities.  Patient underwent left frontal IVC placement 07/07/2020 per Dr. Venetia Maxon.  MRI follow-up showed right basal ganglia thalamic intraparenchymal hemorrhage decompressing into the ventricular system with large amount of blood within the supratentorial and infratentorial ventricular system.  Maintained on Cleviprex for blood pressure control.  Patient did require short-term intubation.  Therapy evaluations completed due to patient's decrease in functional mobility recommendations for physical medicine rehab consult.  Nursing indicates that the patient is emptying his bladder, bowels are moving as well. Discussed patient with Dr. Pearlean Brownie, IVC will likely be in for about another week.  Still on Precedex IV but is off Cleviprex.  Review of Systems  Constitutional: Positive for malaise/fatigue. Negative for chills and fever.  HENT: Negative for  hearing loss.   Eyes: Negative for blurred vision and double vision.  Respiratory: Negative for cough and shortness of breath.   Cardiovascular: Negative for chest pain, palpitations and leg swelling.  Gastrointestinal: Positive for nausea and vomiting.  Genitourinary: Negative for dysuria, flank pain and hematuria.  Musculoskeletal: Positive for myalgias.  Skin: Negative for rash.  Neurological: Positive for dizziness and headaches. Negative for seizures.  All other systems reviewed and are negative.  Past Medical History:  Diagnosis Date  . Epilepsy (HCC)    History reviewed. No pertinent surgical history. Family History  Problem Relation Age of Onset  . Diabetes Mother   . Diabetes Father   . Colon cancer Neg Hx    Social History:  reports that he has been smoking cigarettes and cigars. He has never used smokeless tobacco. He reports current alcohol use. He reports that he does not use drugs. Allergies:  Allergies  Allergen Reactions  . Penicillins Shortness Of Breath and Swelling    Has patient had a PCN reaction causing immediate rash, facial/tongue/throat swelling, SOB or lightheadedness with hypotension: yes Has patient had a PCN reaction causing severe rash involving mucus membranes or skin necrosis: no Has patient had a PCN reaction that required hospitalization: unknown Has patient had a PCN reaction occurring within the last 10 years: no If all of the above answers are "NO", then may proceed with Cephalosporin use.    No medications prior to admission.    Home: Home Living Family/patient expects to be discharged to:: Private residence Living Arrangements: Spouse/significant other,Children Available Help at Discharge: Family,Available 24 hours/day Type of Home: Apartment Home Access: Level entry Home Layout: Two level Alternate Level Stairs-Number of Steps: ~13 Alternate Level Stairs-Rails: Right Bathroom Shower/Tub:  Tub/shower unit Bathroom Toilet:  Standard Bathroom Accessibility: No Home Equipment: None Additional Comments: pt lives with his girlfriend and 9yo daughter in home with level entry but bed/bath upstairs. Pt mother present and states pt can stay with her at d/c. she has a hospital bed on first floor  Lives With: Daughter,Significant other  Functional History: Prior Function Level of Independence: Independent Comments: independent, driving Functional Status:  Mobility: Bed Mobility Overal bed mobility: Needs Assistance Bed Mobility: Supine to Sit Supine to sit: Min guard General bed mobility comments: pt transitioned supine to long sit without assist, able to pivot to sitting EOB with increased cues and increased time. No physical assist Transfers Overall transfer level: Needs assistance Equipment used: 1 person hand held assist Transfers: Sit to/from [employer] Sit to Stand: Max assist,Mod assist Stand pivot transfers: Max assist General transfer comment: maxA to stand initially, progressed to modA with second attempt. Pt with strong posterior lean initially able to correct with cues. maxA to manage small pivoting steps to recliner. facilitation at hips for steps Ambulation/Gait Ambulation/Gait assistance: Max assist Gait Distance (Feet): 3 Feet Assistive device: 1 person hand held assist Gait Pattern/deviations: Step-to pattern,Decreased stride length,Trunk flexed General Gait Details: pt with small pivoting steps to recliner, able to complete with facilitation at hips to cue for steps and maxA at trunk to support Gait velocity: decreased Gait velocity interpretation: <1.31 ft/sec, indicative of household ambulator    ADL:    Cognition: Cognition Overall Cognitive Status: Impaired/Different from baseline Orientation Level: Oriented to person,Oriented to time,Disoriented to place,Disoriented to situation Cognition Arousal/Alertness: Awake/alert Behavior During Therapy: Flat affect Overall  Cognitive Status: Impaired/Different from baseline Area of Impairment: Orientation,Memory,Following commands,Safety/judgement,Awareness,Problem solving Orientation Level: Disoriented to,Place,Time,Situation Memory: Decreased recall of precautions,Decreased short-term memory Following Commands: Follows one step commands consistently,Follows one step commands with increased time Safety/Judgement: Decreased awareness of safety,Decreased awareness of deficits Awareness: Intellectual Problem Solving: Slow processing,Decreased initiation,Difficulty sequencing,Requires verbal cues General Comments: pt with slowed processing, requiring verbal cues for technique, safety, and sequencing through session. Pt thinks he is at a condo, shocked that he is in hospital with no recollection of events to bring him here. Pt unaware of mother's presence even through she states she has talked to him multiple times today.  Blood pressure (!) 155/109, pulse 82, temperature 98.3 F (36.8 C), temperature source Oral, resp. rate (!) 24, height 6\' 2"  (1.88 m), weight 70.1 kg, SpO2 100 %. Physical Exam Neurological:     Comments: Patient was a bit lethargic but arousable. He displayed decrease in level of alertness as well as attention. He was able to provide his name and place. He can provide year with cues. He was unable to provide month or day of the week. Followed simple commands.   Posture, leans to the left General: No acute distress Mood and affect are appropriate Heart: Regular rate and rhythm no rubs murmurs or extra sounds Lungs: Clear to auscultation, breathing unlabored, no rales or wheezes Abdomen: Positive bowel sounds, soft nontender to palpation, mildly distended Extremities: No clubbing, cyanosis, or edema Skin: No evidence of breakdown, no evidence of rash Neurologic: Cranial nerves II through XII intact, difficult to perform formal testing however is able to at least get 4/5 strength in bilateral  deltoid, bicep, tricep, grip, hip flexor, knee extensor, ankle dorsiflexor and plantar flexor. Sensory exam difficulty cooperating with exam but does withdraw to pinch bilateral lower extremities.  Cerebellar exam unable to cooperate musculoskeletal: Full range of motion in all  4 extremities. No joint swelling  Results for orders placed or performed during the hospital encounter of 06/23/2020 (from the past 24 hour(s))  Glucose, capillary     Status: Abnormal   Collection Time: 07/08/20  4:32 PM  Result Value Ref Range   Glucose-Capillary 139 (H) 70 - 99 mg/dL  phosphorus level     Status: None   Collection Time: 07/08/20  9:14 PM  Result Value Ref Range   Phosphorus 3.1 2.5 - 4.6 mg/dL  Basic metabolic panel     Status: Abnormal   Collection Time: 07/09/20  4:25 AM  Result Value Ref Range   Sodium 143 135 - 145 mmol/L   Potassium 3.2 (L) 3.5 - 5.1 mmol/L   Chloride 105 98 - 111 mmol/L   CO2 22 22 - 32 mmol/L   Glucose, Bld 98 70 - 99 mg/dL   BUN 7 6 - 20 mg/dL   Creatinine, Ser 9.50 0.61 - 1.24 mg/dL   Calcium 8.8 (L) 8.9 - 10.3 mg/dL   GFR, Estimated >93 >26 mL/min   Anion gap 16 (H) 5 - 15  CBC     Status: Abnormal   Collection Time: 07/09/20  4:25 AM  Result Value Ref Range   WBC 16.4 (H) 4.0 - 10.5 K/uL   RBC 3.55 (L) 4.22 - 5.81 MIL/uL   Hemoglobin 12.4 (L) 13.0 - 17.0 g/dL   HCT 71.2 (L) 45.8 - 09.9 %   MCV 104.8 (H) 80.0 - 100.0 fL   MCH 34.9 (H) 26.0 - 34.0 pg   MCHC 33.3 30.0 - 36.0 g/dL   RDW 83.3 82.5 - 05.3 %   Platelets 184 150 - 400 K/uL   nRBC 0.0 0.0 - 0.2 %  Magnesium     Status: None   Collection Time: 07/09/20  4:25 AM  Result Value Ref Range   Magnesium 1.9 1.7 - 2.4 mg/dL   DG Chest 1 View  Result Date: 07/08/2020 CLINICAL DATA:  ETT placement EXAM: CHEST  1 VIEW COMPARISON:  July 07, 2020 FINDINGS: The cardiomediastinal silhouette is unchanged in contour.ETT tip terminates 3.8 cm above the carina. The enteric tube courses through the chest to the  abdomen beyond the field-of-view. No pleural effusion. No pneumothorax. Incomplete assessment of the costophrenic angles. LEFT retrocardiac opacity. Visualized abdomen is unremarkable. No acute osseous abnormality. IMPRESSION: 1.  Support apparatus as described above. 2. LEFT retrocardiac opacity, likely atelectasis. Superimposed infection could present similarly. Electronically Signed   By: Meda Klinefelter MD   On: 07/08/2020 12:21     Assessment/Plan: Diagnosis: Right thalamic/basal ganglia hemorrhage 1. Does the need for close, 24 hr/day medical supervision in concert with the patient's rehab needs make it unreasonable for this patient to be served in a less intensive setting? Yes 2. Co-Morbidities requiring supervision/potential complications: Uncontrolled hypertension, alcohol withdrawal on CIWA protocol, urinary incontinence 3. Due to bladder management, bowel management, safety, skin/wound care, disease management, medication administration, pain management and patient education, does the patient require 24 hr/day rehab nursing? Yes 4. Does the patient require coordinated care of a physician, rehab nurse, therapy disciplines of PT, OT, speech to address physical and functional deficits in the context of the above medical diagnosis(es)? Yes Addressing deficits in the following areas: balance, endurance, locomotion, strength, transferring, bowel/bladder control, bathing, dressing, feeding, grooming, toileting, cognition and psychosocial support 5. Can the patient actively participate in an intensive therapy program of at least 3 hrs of therapy per day at least 5 days per  week? Not currently but should be able to do so in about a week's time 6. The potential for patient to make measurable gains while on inpatient rehab is Good once medically stable 7. Anticipated functional outcomes upon discharge from inpatient rehab are supervision and min assist  with PT, supervision and min assist with OT,  supervision and min assist with SLP. 8. Estimated rehab length of stay to reach the above functional goals is: 18 to 21 days 9. Anticipated discharge destination: Home 10. Overall Rehab/Functional Prognosis: good  RECOMMENDATIONS: This patient's condition is appropriate for continued rehabilitative care in the following setting: CIR once intraventricular monitoring is discontinued and patient is off IV sedatives as well as tolerating therapy out of bed Patient has agreed to participate in recommended program. N/A Note that insurance prior authorization may be required for reimbursement for recommended care.  Comment: Patient will be going to mother's home   Charlton AmorDaniel J Angiulli, PA-C 07/09/2020   "I have personally performed a face to face diagnostic evaluation of this patient.  Additionally, I have reviewed and concur with the physician assistant's documentation above." Erick ColaceAndrew E. Jontavia Leatherbury M.D. [facility] Fellow Am Acad of Phys Med and Rehab Diplomate Am Board of Electrodiagnostic Med Fellow Am Board of Interventional Pain

## 2020-07-09 NOTE — Progress Notes (Signed)
AM K+ 3.2 with creat 0.66 and GFR > 60. ELink CCM electrolyte protocol initiated.

## 2020-07-09 NOTE — Evaluation (Signed)
Clinical/Bedside Swallow Evaluation Patient Details  Name: Tedd Cottrill MRN: [id number] Date of Birth: 1975-11-22  Today's Date: 07/09/2020 Time: SLP Start Time (ACUTE ONLY): 1030 SLP Stop Time (ACUTE ONLY): 1045 SLP Time Calculation (min) (ACUTE ONLY): 15 min  Past Medical History:  Past Medical History:  Diagnosis Date  . Epilepsy [facility])    Past Surgical History: History reviewed. No pertinent surgical history. HPI:  Patient is a 45 y.o. male with PMH: alcoholism (reportedly drinks vodka continuously, 1-2 pints per day) who presented to ED on 2/24 after c/o headache and dizziness while picking up brother from work. His brother then drove patient home and when patient exited car he began to vomit and fell out of car striking right side of his face. His brother then called EMS. 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 Pt intubated after arrival (2/24) and extubated 2/26 with placement of L frontal IVC on 2/25.   Assessment / Plan / Recommendation Clinical Impression  Patient presents with what appears to be a primary esophageal dyspahagia with almost immediate regurgitation of clear liquid secretions as well as orange colored liquid secretions (suspect from a juice from earlier). Patient is lethargic but alertness improved and he was able to consume PO's of thin liquids. Swallow appeared timely and although presentation does appear more esophageal in natures, SLP to continue to follow patient to determine if need to have objective swallow study (MBS, FEES). SLP Visit Diagnosis: Dysphagia, unspecified (R13.10)    Aspiration Risk  Mild aspiration risk;Moderate aspiration risk    Diet Recommendation Thin liquid (continue with clear liquids but cease if vomitting/regurgitation continues)   Liquid Administration via: Cup;Straw Supervision: Full supervision/cueing for compensatory  strategies;Staff to assist with self feeding Compensations: Minimize environmental distractions;Slow rate;Small sips/bites Postural Changes: Seated upright at 90 degrees;Remain upright for at least 30 minutes after po intake    Other  Recommendations Recommended Consults: Consider GI evaluation;Consider esophageal assessment Oral Care Recommendations: Oral care BID;Staff/trained caregiver to provide oral care   Follow up Recommendations 24 hour supervision/assistance;Skilled Nursing facility;Inpatient Rehab      Frequency and Duration min 2x/week  1 week       Prognosis Prognosis for Safe Diet Advancement: Good      Swallow Study   General Date of Onset: 07/03/2020 HPI: Patient is a 45 y.o. male with PMH: alcoholism (reportedly drinks vodka continuously, 1-2 pints per day) who presented to ED on 2/24 after c/o headache and dizziness while picking up brother from work. His brother then drove patient home and when patient exited car he began to vomit and fell out of car striking right side of his face. His brother then called EMS. 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 Pt intubated after arrival (2/24) and extubated 2/26 with placement of L frontal IVC on 2/25. Type of Study: Bedside Swallow Evaluation Previous Swallow Assessment: None found Diet Prior to this Study: Thin liquids (clear liquids) Temperature Spikes Noted: No Respiratory Status: Nasal cannula History of Recent Intubation: Yes Length of Intubations (days): 3 days Date extubated: 07/08/20 Behavior/Cognition: Alert;Lethargic/Drowsy;Requires cueing;Confused;Cooperative Oral Cavity Assessment: Within Functional Limits Oral Care Completed by SLP: Yes Oral Cavity - Dentition: Adequate natural dentition Self-Feeding Abilities: Needs assist;Needs set up Patient Positioning: Upright in bed Baseline Vocal Quality: Low vocal  intensity Volitional Cough: Weak Volitional Swallow: Unable to elicit    Oral/Motor/Sensory Function Overall Oral Motor/Sensory Function: Generalized oral  weakness Facial Symmetry: Within Functional Limits Facial Strength: Reduced right;Reduced left Lingual ROM: Within Functional Limits Lingual Symmetry: Within Functional Limits Lingual Strength: Reduced   Ice Chips Ice chips: Impaired Oral Phase Impairments: Reduced lingual movement/coordination Oral Phase Functional Implications: Prolonged oral transit   Thin Liquid Thin Liquid: Impaired Presentation: Cup;Straw Pharyngeal  Phase Impairments: Other (comments) (almost immediate regurgitation of clear and yellowish liquids as well as orange colored liquid (suspect juice from earlier))    Nectar Thick     Honey Thick     Puree Puree: Not tested   Solid     Solid: Not tested      Angela Nevin, MA, CCC-SLP Speech Therapy [facility] Acute Rehab

## 2020-07-09 NOTE — Evaluation (Signed)
Speech Language Pathology Evaluation Patient Details Name: Charles Aguilar MRN: [id number] DOB: [birth year] Today's Date: 07/09/2020 Time: 1045-1100 SLP Time Calculation (min) (ACUTE ONLY): 15 min  Problem List:  Patient Active Problem List   Diagnosis Date Noted  . ICH (intracerebral hemorrhage) (HCC) 07/05/2020  . Encounter for intubation   . 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 Epilepsy [facility])    Past Surgical History: History reviewed. No pertinent surgical history. HPI:  Patient is a 45 y.o. male with PMH: alcoholism (reportedly drinks vodka continuously, 1-2 pints per day) who presented to ED on 2/24 after c/o headache and dizziness while picking up brother from work. His brother then drove patient home and when patient exited car he began to vomit and fell out of car striking right side of his face. His brother then called EMS. 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 Pt intubated after arrival (2/24) and extubated 2/26 with placement of L frontal IVC on 2/25.   Assessment / Plan / Recommendation Clinical Impression  Patient presents with a moderate cognitive impairment as per this limited evaluation. Patient was not able to participate in standardized cognitive assessment secondary to poor alertness and able to initiate and maintain attention. Patient oriented to self, place (hospital) able to choose correct year only with two choice cues, was not oriented to month, day of week. He followed basic level commands during oral motor assessment. Currently, patient is not able to care for himself, make adequate or safe decisions and is requiring 24 hour supervision and assistance with even basic ADL's.    SLP Assessment  SLP Recommendation/Assessment: Patient needs continued Speech Lanaguage Pathology Services SLP Visit Diagnosis:  Cognitive communication deficit (R41.841)    Follow Up Recommendations  24 hour supervision/assistance;Skilled Nursing facility;Inpatient Rehab    Frequency and Duration min 2x/week  1 week      SLP Evaluation Cognition  Overall Cognitive Status: Impaired/Different from baseline Orientation Level: Oriented to person;Disoriented to situation;Disoriented to time;Oriented to place Attention: Focused Focused Attention: Impaired Focused Attention Impairment: Verbal basic;Functional basic Problem Solving: Impaired Problem Solving Impairment: Verbal basic;Functional basic Executive Function: Initiating Initiating: Impaired Initiating Impairment: Verbal basic;Functional basic       Comprehension  Auditory Comprehension Overall Auditory Comprehension: Other (comment) (tested at very basic level) Conversation: Simple Interfering Components: Attention;Processing speed EffectiveTechniques: Repetition;Extra processing time Visual Recognition/Discrimination Discrimination: Not tested Reading Comprehension Reading Status: Not tested    Expression Expression Primary Mode of Expression: Verbal Verbal Expression Overall Verbal Expression: Other (comment) (limited assessment secondary to patient's lethargy) Interfering Components: Attention   Oral / Motor  Oral Motor/Sensory Function Overall Oral Motor/Sensory Function: Generalized oral weakness Facial Symmetry: Within Functional Limits Facial Strength: Reduced right;Reduced left Lingual ROM: Within Functional Limits Lingual Symmetry: Within Functional Limits Lingual Strength: Reduced   GO          Angela Nevin, MA, CCC-SLP Speech Therapy

## 2020-07-09 NOTE — Progress Notes (Signed)
Patient ID: Charles Aguilar, male   DOB: [birth year], 45 y.o.   MRN: [id number] Patient is now extubated his level of consciousness is fair His IVC stopped draining this morning Given the amount of blood that he has in his ventricles will place 2 mg of TPA in the ventricular system to see if this does not help resolve some of the clot

## 2020-07-09 NOTE — Progress Notes (Signed)
Pharmacy Electrolyte Replacement  Recent Labs:  Recent Labs    07/08/20 2114 07/09/20 0425  K  --  3.2*  MG  --  1.9  PHOS 3.1  --   CREATININE  --  0.66    Low Critical Values (K </= 2.5, Phos </= 1, Mg </= 1) Present: None  Plan:  - K repletion orders already entered - Mg 1.9 - 2g IV x 1 - F/u Mg w/ AM labs  Thank you for allowing pharmacy to be a part of this patient's care.  Georgina Pillion, PharmD, BCPS Clinical Pharmacist Clinical phone for 07/09/2020: [phone] 07/09/2020 12:44 PM   **Pharmacist phone directory can now be found on [email or web address] ([email or web address]).  Listed under [facility].

## 2020-07-09 NOTE — Progress Notes (Addendum)
STROKE TEAM PROGRESS NOTE   INTERVAL HISTORY Pt is evaluated at the bedside. Mother is present in the room. Mother added the patient drinks at least 1-2 pints of liquor daily. Overnight weaning off sedation in preparation for extubation and became agitated and sedation continued. On Neuro Exam he is intubated, drowsy but following all commands. B/l  eyes conjugate position, pupils equal and reactive to light. Pt can lift all extremities against gravity. Tremors present in b/l upper extremities. Pt intially admitted with c/o Headache, dizziness, vomiting and progressive AMS. CT Head showed 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 CTA Head and Neck showed no large medium vessel occlusion or stenosis. No  increase in size of the right basal ganglia hemorrhage. Slightly more intraventricular blood particularly in Rt lateral Ventricle than was seen previously. Slight enlargement in ventricular size when compared to yesterday's study. NS consulted Frontal IVC placed on 2/25 by NS. BP controlled by Claviprex drip and labetalol PRN. HbA1c 5.7, LDL 79, K is low at 3.1, Na 145, creatinine 0.90, ECHO Pending, MRI pending. OT/PT Pending. 3% saline discontinued. Replete K per tube.  07/09/2020 Patient is now extubated he is somnolent but arouses to light stimulation, grunts and moves all extremities purposefully.  His EVD stopped draining this morning and discussed with Dr. Danielle Dess from neurosurgery.  Given the amount of blood that he has in his ventricles will place 2 mg of TPA in the ventricular system to see if this does not help resolve some of the clot  Vitals:   07/09/20 0500 07/09/20 0600 07/09/20 0700 07/09/20 0800  BP: (!) 144/100 (!) 149/102 (!) 143/102   Pulse: 82 77 80   Resp: 15 14 18    Temp:    98.3 F (36.8 C)  TempSrc:    Axillary  SpO2: 95% 99%    Weight:      Height:       CBC:  Recent Labs  Lab July 19, 2020 1900 07-19-20 1946 07/08/20 0431  07/09/20 0425  WBC 5.9   < > 11.2* 16.4*  NEUTROABS 3.4  --   --   --   HGB 12.7*   < > 11.3* 12.4*  HCT 38.2*   < > 32.7* 37.2*  MCV 104.9*   < > 102.2* 104.8*  PLT 212   < > 200 184   < > = values in this interval not displayed.   Basic Metabolic Panel:  Recent Labs  Lab 07/07/20 1611 07/08/20 0431 07/08/20 2114 07/09/20 0425  NA  --  140  --  143  K  --  3.5  --  3.2*  CL  --  106  --  105  CO2  --  23  --  22  GLUCOSE  --  144*  --  98  BUN  --  9  --  7  CREATININE  --  0.63  --  0.66  CALCIUM  --  8.7*  --  8.8*  MG 2.1 1.9  --   --   PHOS 2.0* 1.1* 3.1  --    Lipid Panel:  Recent Labs  Lab 07/07/20 0523  CHOL 225*  TRIG 68  HDL 132  CHOLHDL 1.7  VLDL 14  LDLCALC 79   HgbA1c:  Recent Labs  Lab 07/07/20 0616  HGBA1C 5.7*   Urine Drug Screen:  Recent Labs  Lab 07/07/20 0125  LABOPIA NONE DETECTED  COCAINSCRNUR NONE DETECTED  LABBENZ POSITIVE*  AMPHETMU NONE DETECTED  THCU NONE  DETECTED  LABBARB NONE DETECTED    Alcohol Level  Recent Labs  Lab 07/04/2020 1915  ETH 242*    IMAGING past 24 hours DG Chest 1 View  Result Date: 07/08/2020 CLINICAL DATA:  ETT placement EXAM: CHEST  1 VIEW COMPARISON:  July 07, 2020 FINDINGS: The cardiomediastinal silhouette is unchanged in contour.ETT tip terminates 3.8 cm above the carina. The enteric tube courses through the chest to the abdomen beyond the field-of-view. No pleural effusion. No pneumothorax. Incomplete assessment of the costophrenic angles. LEFT retrocardiac opacity. Visualized abdomen is unremarkable. No acute osseous abnormality. IMPRESSION: 1.  Support apparatus as described above. 2. LEFT retrocardiac opacity, likely atelectasis. Superimposed infection could present similarly. Electronically Signed   By: Meda Klinefelter MD   On: 07/08/2020 12:21    PHYSICAL EXAM  GENERAL: Drowsy but follows simple commands.  HEENT: - Normocephalic and atraumatic, dry mucous membranes. LUNGS -  Symmetrical chest rise, No labored breathing noted CV - no JVD, No Peripheral Edema ABDOMEN - Soft, nondistended  Ext: warm, well perfused, intact peripheral pulses, no Peripheral edema, B/L tremors present  NEURO Exam:  Remains relatively unchanged. Mental Status: drowsy grunts but follows simple commands.  Language: Grunts but too drowsy to talk.  Cranial Nerves:   CN II Pupils ~53mm equal and reactive to light, no VF deficits.   CN III,IV,VI EOM intact, no gaze preference or deviation, no nystagmus.   CN V normal sensation in V1, V2, and V3 segments bilaterally.   CN VII Mild left lower face weakness with significant swelling of face and lips.   CN VIII normal hearing to speech.   CN IX & X Gag+   CN XI Head turns freely with good strength to left.   CN XII midline tongue protrusion   Exam was limited to do just being extubated and still drowsy. Motor: Drift in all extremities, seems to have good strength in all extremities. Tone: is normal and bulk is normal. Sensation- Intact to light touch b/l Coordination: unable to test. Gait- deferred.  ASSESSMENT/PLAN Mr. Sylvain Hasten is a 45 y.o. male history of heavy alcohol, abuse, tobacco abuse (3 to 40 cigarettes daily), TIA presented with Headache, dizziness, vomiting and progressive decreased level of consciousness and a fall. 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 CTA Head and Neck showed no large medium vessel occlusion or stenosis. No  increase in size of the right basal ganglia hemorrhage. Slightly more intraventricular blood particularly in Rt lateral Ventricle than was seen previously. BP controlled by Claviprex drip and labetalol PRN. HbA1c 5.7, LDL 79, ECHO Pending, MRI pending. OT/PT Pending. Etiology of ICH and IVH is likely due to hypertension with risk factors of alcohol and smoking. MRI showed stable hematoma, blood  pressure goal will relax to less than 160. Continue Cleviprex, will add p.o. BP meds. EVD per neurosurgery. CIWA protocol, avoid DT. On B1/FA/multivitamin. Patient is now extubated he is somnolent but arouses to light stimulation, grunts and moves all extremities purposefully. His EVD stopped draining this morning and discussed with Dr. Danielle Dess from neurosurgery he will infuse 2 mg of tPA in the ventricular system to attempt to dissolve the clot.    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 "There is new mild to moderate pan  ventricular dilatation compared to prior exam."  CTA head & neck - No large or medium vessel occlusion or stenosis. No  increase in size of the right basal ganglia hemorrhage. Slight enlargement in ventricular size when compared to yesterday's study.  MRI stable hematoma and IVH, stable size of ventricular system  2D Echo EF 55-60%  LDL 79  HgbA1c 5.7  VTE prophylaxis - SCD's now (due to ICH)  No antithrombotic prior to admission, now on No antithrombotic due to ICH  Therapy recommendations: Pending  Disposition: TBD  Hydrocephalous  Was on 3% Normal Saline>off now.  NSG on board  IVC placed on 2/25  ICP not high  IVC obstructed - Neurosurgery will infuse 2 mg of tPA this morning.  Hypertension  Home meds:  None  Stable on cleviprex . BP goal systolic <160 mmHg . Long-term BP goal normotensive . Labetalol PRN  . On Claviprex . on Amlodipine 10 mg daily per tube until passes swallow test.    Hyperlipidemia  Home meds:  None.  LDL 79, goal < 70  Consider atorvastatin 40mg  on discharge.  Smoker  Uses 3-40 cigarettes /day. Cigar use.  May offer Nicotine patch when stabalize.  Smoking cessation education will be provided.   Alcohol Use  Heavy daily alcohol/liquor use.  Continue  Thiamine/MVI/ Folic acid  CIWA protocol.  Hypokalemia  resolved.   Other Stroke Risk Factors  Hx of TIA  Hx of seizure - likely alcohol related??  Hospital day # 3  , MD Page: Marisue Humble   This patient is critically ill due to ICH, IVH and hydrocephalus and at significant risk of neurological worsening, death form obstructive hydrocephalus, cerebral edema, brain herniation, seizure, DTs. This patient's care requires constant monitoring of vital signs, hemodynamics, respiratory and cardiac monitoring, review of multiple databases, neurological assessment, discussion with family, other specialists and medical decision making of high complexity. I spent 40 minutes of neurocritical care time in the care of this patient. I had long discussion with mom at bedside, updated pt current condition, treatment plan and potential prognosis, and answered all the questions.  She expressed understanding and appreciation.     To contact Stroke Continuity provider, please refer to [phone]. After hours, contact General Neurology

## 2020-07-10 DIAGNOSIS — I610 Nontraumatic intracerebral hemorrhage in hemisphere, subcortical: Principal | ICD-10-CM

## 2020-07-10 LAB — CBC
HCT: 34.1 % — ABNORMAL LOW (ref 39.0–52.0)
Hemoglobin: 11.4 g/dL — ABNORMAL LOW (ref 13.0–17.0)
MCH: 34.2 pg — ABNORMAL HIGH (ref 26.0–34.0)
MCHC: 33.4 g/dL (ref 30.0–36.0)
MCV: 102.4 fL — ABNORMAL HIGH (ref 80.0–100.0)
Platelets: 175 10*3/uL (ref 150–400)
RBC: 3.33 MIL/uL — ABNORMAL LOW (ref 4.22–5.81)
RDW: 12.7 % (ref 11.5–15.5)
WBC: 11.7 10*3/uL — ABNORMAL HIGH (ref 4.0–10.5)
nRBC: 0 % (ref 0.0–0.2)

## 2020-07-10 LAB — BASIC METABOLIC PANEL
Anion gap: 11 (ref 5–15)
BUN: 7 mg/dL (ref 6–20)
CO2: 20 mmol/L — ABNORMAL LOW (ref 22–32)
Calcium: 8.8 mg/dL — ABNORMAL LOW (ref 8.9–10.3)
Chloride: 103 mmol/L (ref 98–111)
Creatinine, Ser: 0.5 mg/dL — ABNORMAL LOW (ref 0.61–1.24)
GFR, Estimated: >60 mL/min (ref >60)
Glucose, Bld: 148 mg/dL — ABNORMAL HIGH (ref 70–99)
Potassium: 3.4 mmol/L — ABNORMAL LOW (ref 3.5–5.1)
Sodium: 134 mmol/L — ABNORMAL LOW (ref 135–145)

## 2020-07-10 LAB — MAGNESIUM: Magnesium: 2.1 mg/dL (ref 1.7–2.4)

## 2020-07-10 LAB — MRSA PCR SCREENING: MRSA by PCR: NEGATIVE

## 2020-07-10 MED ORDER — ALTEPLASE 2 MG IJ SOLR
2.0000 mg | Freq: Once | INTRAMUSCULAR | Status: AC
  Administered 2020-07-10: 2 mg
  Filled 2020-07-10: qty 2

## 2020-07-10 MED ORDER — HYDROCHLOROTHIAZIDE 12.5 MG PO CAPS
12.5000 mg | ORAL_CAPSULE | Freq: Every day | ORAL | Status: DC
  Administered 2020-07-10 – 2020-07-12 (×3): 12.5 mg via ORAL
  Filled 2020-07-10 (×3): qty 1

## 2020-07-10 MED ORDER — HEPARIN SODIUM (PORCINE) 5000 UNIT/ML IJ SOLN
5000.0000 [IU] | Freq: Three times a day (TID) | INTRAMUSCULAR | Status: DC
  Administered 2020-07-10 – 2020-07-21 (×33): 5000 [IU] via SUBCUTANEOUS
  Filled 2020-07-10 (×32): qty 1

## 2020-07-10 MED ORDER — PHENOBARBITAL 32.4 MG PO TABS
32.4000 mg | ORAL_TABLET | Freq: Once | ORAL | Status: AC
  Administered 2020-07-10: 32.4 mg via ORAL
  Filled 2020-07-10: qty 1

## 2020-07-10 MED ORDER — ENSURE ENLIVE PO LIQD
237.0000 mL | Freq: Three times a day (TID) | ORAL | Status: DC
  Administered 2020-07-10 – 2020-07-19 (×17): 237 mL via ORAL

## 2020-07-10 MED ORDER — POTASSIUM CHLORIDE CRYS ER 20 MEQ PO TBCR
40.0000 meq | EXTENDED_RELEASE_TABLET | ORAL | Status: AC
  Administered 2020-07-10 (×2): 40 meq via ORAL
  Filled 2020-07-10 (×2): qty 2

## 2020-07-10 NOTE — Progress Notes (Addendum)
STROKE TEAM PROGRESS NOTE   INTERVAL HISTORY His EVD stopped draining yesterday and 2 mg tPA in the ventricular system given by NS. Pt is evaluated at the bedside today. Mother is present in the room.  On Neuro Exam he is alert and awake, but confused. He is oriented to self and age but not to place and time. He is able to follow all commands. B/l  eyes conjugate position, pupils equal and reactive to light.  Pt can lift all extremities against gravity. Pt has Frontal IVC placed on 2/25 which is draining well now.  He off Claviprex now. Systolic BP between 116-163 and diastolic between 54-008. WBC 11.7, K 3.4,  Afebrile. Pt on Precedex. On vanco for surgical prophylaxis. K Repleted. CCM following.   Vitals:   07/10/20 1100 07/10/20 1200 07/10/20 1300 07/10/20 1400  BP: (!) 138/108 (!) 146/103 (!) 145/110 (!) 158/111  Pulse: 71 64 71 69  Resp: 19 17 (!) 21 (!) 26  Temp:  97.8 F (36.6 C)    TempSrc:  Oral    SpO2: 100% 100% 100% 100%  Weight:      Height:       CBC:  Recent Labs  Lab 07/08/2020 1900 07/10/2020 1946 07/09/20 0425 07/10/20 0242  WBC 5.9   < > 16.4* 11.7*  NEUTROABS 3.4  --   --   --   HGB 12.7*   < > 12.4* 11.4*  HCT 38.2*   < > 37.2* 34.1*  MCV 104.9*   < > 104.8* 102.4*  PLT 212   < > 184 175   < > = values in this interval not displayed.   Basic Metabolic Panel:  Recent Labs  Lab 07/08/20 0431 07/08/20 2114 07/09/20 0425 07/10/20 0242  NA 140  --  143 134*  K 3.5  --  3.2* 3.4*  CL 106  --  105 103  CO2 23  --  22 20*  GLUCOSE 144*  --  98 148*  BUN 9  --  7 7  CREATININE 0.63  --  0.66 0.50*  CALCIUM 8.7*  --  8.8* 8.8*  MG 1.9  --  1.9 2.1  PHOS 1.1* 3.1  --   --    Lipid Panel:  Recent Labs  Lab 07/07/20 0523  CHOL 225*  TRIG 68  HDL 132  CHOLHDL 1.7  VLDL 14  LDLCALC 79   HgbA1c:  Recent Labs  Lab 07/07/20 0616  HGBA1C 5.7*   Urine Drug Screen:  Recent Labs  Lab 07/07/20 0125  LABOPIA NONE DETECTED  COCAINSCRNUR NONE DETECTED   LABBENZ POSITIVE*  AMPHETMU NONE DETECTED  THCU NONE DETECTED  LABBARB NONE DETECTED    Alcohol Level  Recent Labs  Lab 06/21/2020 1915  ETH 242*    IMAGING past 24 hours No results found.  PHYSICAL EXAM  GENERAL: Drowsy but follows simple commands.  HEENT: - Normocephalic and atraumatic, dry mucous membranes. LUNGS - Symmetrical chest rise, No labored breathing noted CV - no JVD, No Peripheral Edema ABDOMEN - Soft, nondistended  Ext: warm, well perfused, intact peripheral pulses, no Peripheral edema, B/L tremors present  NEURO Exam:  Remains relatively unchanged. Mental Status: Awake and Alert, Follows simple commands.  Language: fluent Cranial Nerves:   CN II Pupils equal and reactive to light, no VF deficits.   CN III,IV,VI EOM intact, no gaze preference or deviation, no nystagmus.   CN V normal sensation in V1, V2, and V3 segments bilaterally.  CN VII Mild left lower face weakness with significant swelling of face and lips.   CN VIII normal hearing to speech.   CN IX & X Gag+   CN XI Head turns freely with good strength to left.   CN XII midline tongue protrusion   Exam was limited to do just being extubated and still drowsy. Motor: Mild Drift in all extremities, seems to have good strength in all extremities. Tone: is normal and bulk is normal. Sensation- Intact to light touch b/l Coordination: unable to test. Gait- deferred.  ASSESSMENT/PLAN  [name] is a 45 y.o. male history of heavy alcohol, abuse, tobacco abuse (3 to 40 cigarettes daily), TIA presented with Headache, dizziness, vomiting and progressive decreased level of consciousness and a fall. 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 CTA Head and Neck showed no large medium vessel occlusion or stenosis. No  increase in size of the right basal ganglia hemorrhage. Slightly more  intraventricular blood particularly in Rt lateral Ventricle than was seen previously. BP was controlled by Claviprex drip and labetalol PRN. HbA1c 5.7, LDL 79, Echo showed  EF 55-60%. Pt recommends CIR. Etiology of ICH and IVH is likely due to hypertension with risk factors of alcohol and smoking. MRI showed stable hematoma, blood pressure goal will relax to less than 160. EVD per neurosurgery. CIWA protocol, avoid DT. On B1/FA/multivitamin.   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 "There is new mild to moderate pan ventricular dilatation compared to prior exam."  CTA head & neck - No large or medium vessel occlusion or stenosis. No  increase in size of the right basal ganglia hemorrhage. Slight enlargement in ventricular size when compared to yesterday's study.  MRI stable hematoma and IVH, stable size of ventricular system  2D Echo EF 55-60%  LDL 79  HgbA1c 5.7  VTE prophylaxis - SCD's now (due to ICH)  No antithrombotic prior to admission, now on No antithrombotic due to ICH  Therapy recommendations: CIR  Disposition: TBD  Hydrocephalous  Was on 3% Normal Saline>off now.  NSG on board  IVC placed on 2/25  ICP not high  IVC obstructed - Neurosurgery infused 2 mg of tPA on 2/27  On Precedex now.  Hypertension  Home meds:  None  Was on cleviprex> off claviprex . BP goal systolic <160 mmHg . Long-term BP goal normotensive . Labetalol PRN  . on Amlodipine 10 mg, Coreg 12.5 mg BID  And Microzide 12.5 mg  Hyperlipidemia  Home meds:  None.  LDL 79, goal < 70  Consider atorvastatin 40mg  on discharge.  Smoker  Uses 3-40 cigarettes /day. Cigar use.  May offer Nicotine patch when stabalize.  Smoking cessation education will be provided.   Alcohol Use  Heavy daily alcohol/liquor use.  Continue Thiamine/MVI/  Folic acid  CIWA protocol.  Hypokalemia  resolved.   Other Stroke Risk Factors  Hx of TIA  Hx of seizure - likely alcohol related??  Hospital day # 4  I have personally obtained history,examined this patient, reviewed notes, independently viewed imaging studies, participated in medical decision making and plan of care.ROS completed by me personally and pertinent positives fully documented  I have made any additions or clarifications directly to the above note. Agree with note above.  Patient condition remains neurologically stable.  He is requiring light sedation with Precedex to control his agitation and ventriculostomy is draining after intraventricular TPA was introduced.  Recommend increase oral and as needed blood pressure medications and wean off Cleviprex drip as tolerated keep systolic blood pressure below [id number]   Appreciate neurosurgery help.This patient is critically ill and at significant risk of neurological worsening, death and care requires constant monitoring of vital signs, hemodynamics,respiratory and cardiac monitoring, extensive review of multiple databases, frequent neurological assessment, discussion with family, other specialists and medical decision making of high complexity.I have made any additions or clarifications directly to the above note.This critical care time does not reflect procedure time, or teaching time or supervisory time of PA/NP/Med Resident etc but could involve care discussion time.  I spent 30 minutes of neurocritical care time  in the care of  this patient.      Delia Heady, MD Medical Director [facility] Pager: [phone] 07/10/2020 3:18 PM     To contact Stroke Continuity provider, please refer to [email or web address]. After hours, contact General Neurology

## 2020-07-10 NOTE — Progress Notes (Signed)
Pharmacy Antibiotic Note  Charles Aguilar is a 45 y.o. male admitted on 07/04/2020 with a headache, found to have ICH with hydrocephalus s/p EVD placement on 07/07/20.  Pharmacy has been consulted for vancomycin dosing for prophylaxis.  D#4 of therapy.  Renal function stable, afebrile, WBC WNL.  Plan: Continue Vanc 1250mg  IV Q12H for trough 10-15 mcg/mL Monitor renal fxn, clinical progress, vanc trough soon if still on therapy   Height: 6\' 2"  (188 cm) Weight: 70.1 kg (154 lb 8.7 oz) IBW/kg (Calculated) : 82.2  Temp (24hrs), Avg:97.8 F (36.6 C), Min:97 F (36.1 C), Max:98.8 F (37.1 C)  Recent Labs  Lab 06/23/2020 1900 07/10/2020 2007 07/07/20 0858 07/08/20 0431 07/09/20 0425 07/10/20 0242  WBC 5.9  --  8.1 11.2* 16.4* 11.7*  CREATININE 0.70 0.90 0.76 0.63 0.66 0.50*    Estimated Creatinine Clearance: 116.8 mL/min (A) (by C-G formula based on SCr of 0.5 mg/dL (L)).    Allergies  Allergen Reactions  . Penicillins Shortness Of Breath and Swelling    Has patient had a PCN reaction causing immediate rash, facial/tongue/throat swelling, SOB or lightheadedness with hypotension: yes Has patient had a PCN reaction causing severe rash involving mucus membranes or skin necrosis: no Has patient had a PCN reaction that required hospitalization: unknown Has patient had a PCN reaction occurring within the last 10 years: no If all of the above answers are "NO", then may proceed with Cephalosporin use.     Charles Aguilar D. 07/11/20, PharmD, BCPS, BCCCP 07/10/2020, 8:45 AM

## 2020-07-10 NOTE — Evaluation (Signed)
Occupational Therapy Evaluation Patient Details Name: Charles Aguilar MRN: [id number] DOB: 08/03/1975 Today's Date: 07/10/2020    History of Present Illness The pt is a 45 yo male presenting from home with headache, dizziness, and progressive altered mentation after multiple syncopal episodes. 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 Pt intubated after arrival (2/24) and extubated 2/26 with placement of L frontal IVC on 2/25.   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 Pt requires (A) for basic transfer total +2 mod (A). Pt with decreased visual attention but noted to have sedation medication running.  Patient will benefit from skilled OT acutely to increase independence and safety with ADLS to allow discharge CIR.     Follow Up Recommendations  CIR    Equipment Recommendations  3 in 1 bedside commode    Recommendations for Other Services Rehab consult     Precautions / Restrictions Precautions Precautions: Fall Precaution Comments: IVC needs to be clamped for mobiltiy      Mobility Bed Mobility Overal bed mobility: Needs Assistance Bed Mobility: Rolling;Supine to Sit;Sit to Supine Rolling: Mod assist   Supine to sit: Mod assist Sit to supine: Mod assist;+2 for physical assistance   General bed mobility comments: pt requires (A) for trunk to come to the eob. pt requires (A) to sustain eob sitting. pt requires (A) for bil LE to return to supine.    Transfers Overall transfer level: Needs assistance   Transfers: Sit to/from Stand;Stand Pivot Transfers Sit to Stand: +2 physical assistance;Mod assist Stand pivot transfers: +2 physical assistance;Mod assist       General transfer comment: pt requires (A) to elevate from bed surface but does intiiate. Pt pivoting toward R side with max cues     Balance Overall balance assessment: Needs assistance Sitting-balance support: Single extremity supported;Feet supported Sitting balance-Leahy Scale: Poor   Postural control: Posterior lean Standing balance support: Single extremity supported;During functional activity Standing balance-Leahy Scale: Poor Standing balance comment: reliant on external support                           ADL either performed or assessed with clinical judgement   ADL Overall ADL's : Needs assistance/impaired     Grooming: Wash/dry face;Moderate assistance;Bed level Grooming Details (indicate cue type and reason): washing face and cues to avoid L side of head             Lower Body Dressing: Total assistance Lower Body Dressing Details (indicate cue type and reason): supine to don socks                     Vision   Vision Assessment?: Yes Eye Alignment: Impaired (comment) Ocular Range of Motion: Restricted looking up Alignment/Gaze Preference: Gaze right Tracking/Visual Pursuits: Unable to hold eye position out of midline;Impaired - to be further tested in functional context Convergence: Impaired (comment) Additional Comments: pt reports that its double sometimes. pt unable to sustain eyes open throughout session     Perception     Praxis      Pertinent Vitals/Pain Pain Assessment: No/denies pain     Hand Dominance Right   Extremity/Trunk Assessment Upper Extremity Assessment Upper Extremity Assessment: Generalized weakness       Cervical / Trunk Assessment Cervical / Trunk Assessment: Other exceptions Cervical / Trunk Exceptions: neck  rotation toward L side   Communication Communication Communication: Other (comment) (speech soft and slurred at times)   Cognition Arousal/Alertness: Awake/alert Behavior During Therapy: Flat affect Overall Cognitive Status: Impaired/Different from baseline Area of Impairment: Orientation;Memory;Following  commands;Safety/judgement;Awareness;Problem solving;Attention                 Orientation Level: Disoriented to;Place;Time;Situation Current Attention Level: Focused Memory: Decreased recall of precautions;Decreased short-term memory Following Commands: Follows one step commands consistently;Follows one step commands with increased time Safety/Judgement: Decreased awareness of safety;Decreased awareness of deficits Awareness: Intellectual Problem Solving: Slow processing;Decreased initiation;Difficulty sequencing;Requires verbal cues General Comments: slow processing. reports that son is 3yo and lives with baby mama   General Comments  HR stable BP elevated DBP 132 -115    Exercises     Shoulder Instructions      Home Living Family/patient expects to be discharged to:: Private residence Living Arrangements: Spouse/significant other;Children Available Help at Discharge: Family;Available 24 hours/day Type of Home: Apartment Home Access: Level entry     Home Layout: Two level Alternate Level Stairs-Number of Steps: ~13 Alternate Level Stairs-Rails: Right Bathroom Shower/Tub: Chief Strategy Officer: Standard Bathroom Accessibility: No   Home Equipment: None   Additional Comments: pt lives with his girlfriend and 9yo daughter in home with level entry but bed/bath upstairs. Pt mother present and states pt can stay with her at d/c. she has a hospital bed on first floor      Prior Functioning/Environment Level of Independence: Independent                 OT Problem List: Decreased strength;Decreased activity tolerance;Impaired balance (sitting and/or standing);Decreased coordination;Decreased cognition;Impaired vision/perception;Decreased safety awareness;Decreased knowledge of use of DME or AE;Decreased knowledge of precautions      OT Treatment/Interventions: Self-care/ADL training;Therapeutic exercise;Neuromuscular education;Energy conservation;DME  and/or AE instruction;Manual therapy;Modalities;Therapeutic activities;Cognitive remediation/compensation;Visual/perceptual remediation/compensation;Patient/family education;Balance training    OT Goals(Current goals can be found in the care plan section) Acute Rehab OT Goals Patient Stated Goal: none stated OT Goal Formulation: Patient unable to participate in goal setting Time For Goal Achievement: 07/18/20 Potential to Achieve Goals: Good  OT Frequency: Min 2X/week   Barriers to D/C:            Co-evaluation PT/OT/SLP Co-Evaluation/Treatment: Yes Reason for Co-Treatment: Necessary to address cognition/behavior during functional activity;For patient/therapist safety;To address functional/ADL transfers   OT goals addressed during session: Proper use of Adaptive equipment and DME;ADL's and self-care;Strengthening/ROM      AM-PAC OT "6 Clicks" Daily Activity     Outcome Measure Help from another person eating meals?: A Lot Help from another person taking care of personal grooming?: A Lot Help from another person toileting, which includes using toliet, bedpan, or urinal?: A Lot Help from another person bathing (including washing, rinsing, drying)?: A Lot Help from another person to put on and taking off regular upper body clothing?: A Lot Help from another person to put on and taking off regular lower body clothing?: A Lot 6 Click Score: 12   End of Session Nurse Communication: Mobility status;Precautions  Activity Tolerance: Patient tolerated treatment well Patient left: in bed;with call bell/phone within reach;with bed alarm set;Other (comment) (RN in room setting up after bed transfer)  OT Visit Diagnosis: Unsteadiness on feet (R26.81);Muscle weakness (generalized) (M62.81)                Time: 1497-0263 OT Time Calculation (min): 31 min Charges:  OT General Charges $OT Visit: 1 Visit OT Evaluation $OT  Eval Moderate Complexity: 1 Mod   Brynn, OTR/L  Acute  Rehabilitation Services Pager: [phone] Office: [phone] .   Mateo Flow 07/10/2020, 4:10 PM

## 2020-07-10 NOTE — Progress Notes (Signed)
This AM EVD raised to 15cm H20 by NP Arelia Longest. Early afternoon output 73ml for several consecutive hours; neuro status unchanged. 1600 output also 28ml though EVD lowered to ensure pulsitility--output very sluggish, neuro status unchanged; second RN looked at EVD. NP Arelia Longest paged [phone] with return call 1700, no orders at this time as neuro unchanged.  1815 no output, no pulsitility when lowered, pt very lethargic and requiring sternal rub for response; verbal response is minimal.  K. Cabbell on floor, orders for tPA with EVD placed.  Update sent to Arelia Longest NP [phone]. tPA administered 1855 by K. Cabbell MD, pt still requiring sternal rub for response.

## 2020-07-10 NOTE — Progress Notes (Signed)
Subjective: Patient reports that he is following simple commands. He is NAD. No acute events overnight.   Objective: Vital signs in last 24 hours: Temp:  [97 F (36.1 C)-98.8 F (37.1 C)] 97.5 F (36.4 C) (02/28 0400) Pulse Rate:  [54-83] 60 (02/28 0600) Resp:  [12-24] 15 (02/28 0600) BP: (116-163)/(81-130) 162/124 (02/28 0600) SpO2:  [96 %-100 %] 100 % (02/28 0600)  Intake/Output from previous day: 02/27 0701 - 02/28 0700 In: 1065.6 [P.O.:360; I.V.:29.6; IV Piggyback:676] Out: 1399 [Urine:1150; Drains:249] Intake/Output this shift: Total I/O In: -  Out: 15 [Drains:15]  Physical Exam: Patient is alert and oriented to self only. He is following simple commands. PERRLA. MAEW. BUE and BLE 4/5. EVD is patent with CDI dressing.  Lab Results: Recent Labs    07/09/20 0425 07/10/20 0242  WBC 16.4* 11.7*  HGB 12.4* 11.4*  HCT 37.2* 34.1*  PLT 184 175   BMET Recent Labs    07/09/20 0425 07/10/20 0242  NA 143 134*  K 3.2* 3.4*  CL 105 103  CO2 22 20*  GLUCOSE 98 148*  BUN 7 7  CREATININE 0.66 0.50*  CALCIUM 8.8* 8.8*    Studies/Results: DG Chest 1 View  Result Date: 07/08/2020 CLINICAL DATA:  ETT placement EXAM: CHEST  1 VIEW COMPARISON:  July 07, 2020 FINDINGS: The cardiomediastinal silhouette is unchanged in contour.ETT tip terminates 3.8 cm above the carina. The enteric tube courses through the chest to the abdomen beyond the field-of-view. No pleural effusion. No pneumothorax. Incomplete assessment of the costophrenic angles. LEFT retrocardiac opacity. Visualized abdomen is unremarkable. No acute osseous abnormality. IMPRESSION: 1.  Support apparatus as described above. 2. LEFT retrocardiac opacity, likely atelectasis. Superimposed infection could present similarly. Electronically Signed   By: Meda Klinefelter MD   On: 07/08/2020 12:21    Assessment/Plan: The patient's neurological status appears to be stable. EVD draining well with drainage between 3 and 25  cc/h over last 24h. Will raise EVD to 15 cm H20 today. Continue supportive care. No further neurosurgical recommendations at this time. Call with any questions.   LOS: 4 days     Council Mechanic, DNP, NP-C 07/10/2020, 7:44 AM

## 2020-07-10 NOTE — Progress Notes (Signed)
Nutrition Follow-up  DOCUMENTATION CODES:   Not applicable  INTERVENTION:   Ensure Enlive po TID, each supplement provides 350 kcal and 20 grams of protein  NUTRITION DIAGNOSIS:   Inadequate oral intake related to lethargy/confusion as evidenced by meal completion < 50%. Ongoing.   GOAL:   Patient will meet greater than or equal to 90% of their needs Progressing.   MONITOR:   Diet advancement,Supplement acceptance,PO intake  REASON FOR ASSESSMENT:   Consult,Ventilator Enteral/tube feeding initiation and management  ASSESSMENT:   Pt with PMH of ETOH abuse admitted with ICH.    2/25 s/p EVD 2/26 extubated  2/28 diet advanced to full liquids   Pt discussed during ICU rounds and with RN.  Pt remains in restraints and requires full care.   Medications reviewed and include: folic acid, MVI, senokot-s, thiamine  Precedex  Labs reviewed: K: 3.4    Diet Order:   Diet Order            Diet full liquid Room service appropriate? Yes; Fluid consistency: Thin  Diet effective now                 EDUCATION NEEDS:   No education needs have been identified at this time  Skin:  Skin Assessment: Reviewed RN Assessment  Last BM:  2/26  Height:   Ht Readings from Last 1 Encounters:  07/07/20 6\' 2"  (1.88 m)    Weight:   Wt Readings from Last 1 Encounters:  07/08/20 70.1 kg    Ideal Body Weight:     BMI:  Body mass index is 19.84 kg/m.  Estimated Nutritional Needs:   Kcal:  2000-2200  Protein:  105-115 grams  Fluid:  >2 L/day  07/10/20., RD, LDN, CNSC See AMiON for contact information

## 2020-07-10 NOTE — Progress Notes (Signed)
  Speech Language Pathology Treatment: Cognitive-Linquistic;Dysphagia  Patient Details Name: Charles Aguilar MRN: [id number] DOB: May 09, 1976 Today's Date: 07/10/2020 Time: 0086-7619 SLP Time Calculation (min) (ACUTE ONLY): 15 min  Assessment / Plan / Recommendation Clinical Impression  Mom present during intervention for swallow and cognition. He just completed breakfast of clear liquids but agreeable for additional juice via straw without s/s aspiration. Mom denied coughing during breakfast. MD has upgraded to full liquids.  He is on .07 of Precedex therefore, awake conversive, groggy and prefers eyes closed. He independently stated reason for hospitalization "brain bleeding". Auditory hallucinations of the police giving him a pill last night. He lacks insight and stated he needs to eat healthier and exercise to get better. He is currently dependent for all needs, hands are restrained in mitts and has EVD. As sedation is able to be lowered he should be able to retain more information with hopeful carry over.    HPI HPI: Patient is a 45 y.o. male with PMH: alcoholism (reportedly drinks vodka continuously, 1-2 pints per day) who presented to ED on 2/24 after c/o headache and dizziness while picking up brother from work. His brother then drove patient home and when patient exited car he began to vomit and fell out of car striking right side of his face. His brother then called EMS. 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 Pt intubated after arrival (2/24) and extubated 2/26 with placement of L frontal IVC on 2/25.      SLP Plan  Continue with current plan of care       Recommendations  Diet recommendations: Other(comment);Thin liquid (full liquids- per MD) Liquids provided via: Cup;Straw Medication Administration: Crushed with puree Supervision: Patient able to self feed;Full supervision/cueing  for compensatory strategies;Staff to assist with self feeding Compensations: Minimize environmental distractions;Slow rate;Small sips/bites Postural Changes and/or Swallow Maneuvers: Seated upright 90 degrees                General recommendations: Rehab consult Oral Care Recommendations: Oral care BID Follow up Recommendations: Inpatient Rehab SLP Visit Diagnosis: Dysphagia, unspecified (R13.10);Cognitive communication deficit (J09.326) Plan: Continue with current plan of care                       Royce Macadamia 07/10/2020, 9:26 AM Breck Coons Lonell Face.Ed Nurse, children's [phone] Office [phone]

## 2020-07-10 NOTE — Progress Notes (Signed)
Physical Therapy Treatment Patient Details Name: Charles Aguilar MRN: [id number] DOB: 1976-04-18 Today's Date: 07/10/2020    History of Present Illness The pt is a 45 yo male presenting from home with headache, dizziness, and progressive altered mentation after multiple syncopal episodes. 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 Pt intubated after arrival (2/24) and extubated 2/26 with placement of L frontal IVC on 2/25.    PT Comments    The pt was seen by PT/Ot in conjunction to improve safety with mobility this afternoon. The pt was slightly more lethargic than at prior evaluation, but was able to awake and follow commands through the session. The pt required modA to complete bed mobility and initial transfers, but requires assist of 2 to complete power up and to steady in standing at this time. He was able to take small lateral steps as well, but again requires assist of 2 to steady and facilitate movement at hips. The pt also presents with limitations in cognition, safety awareness and awareness of deficits, memory, and problem solving which require increased cues and assist to manage movement safely at this time. The pt will continue to benefit from skilled PT to progress mobility and tolerance for OOB mobility and progression of gait training.     Follow Up Recommendations  CIR     Equipment Recommendations   (defer to post acute)    Recommendations for Other Services       Precautions / Restrictions Precautions Precautions: Fall Precaution Comments: IVC needs to be clamped for mobiltiy Restrictions Weight Bearing Restrictions: No    Mobility  Bed Mobility Overal bed mobility: Needs Assistance Bed Mobility: Rolling;Supine to Sit;Sit to Supine Rolling: Min assist   Supine to sit: Mod assist Sit to supine: Mod assist;+2 for physical assistance   General bed mobility comments: modA at trunk and minA to BLE to complete movement to  EOB. mod-maxA to maintain static sitting with cues and facilitation at pelvis and low back to encourage upright position with anterior pelvic tilt. assist to trunk and BLE to return to supine. pt can bridge for repositioning    Transfers Overall transfer level: Needs assistance Equipment used: 2 person hand held assist Transfers: Sit to/from [employer] Sit to Stand: +2 physical assistance;Mod assist Stand pivot transfers: +2 physical assistance;Mod assist       General transfer comment: pt requires (A) to elevate from bed surface but does intiiate. Pt pivoting toward R side with max cues  Ambulation/Gait Ambulation/Gait assistance: Mod assist;+2 physical assistance Gait Distance (Feet): 5 Feet   Gait Pattern/deviations: Step-to pattern;Decreased stride length;Trunk flexed Gait velocity: decreased Gait velocity interpretation: <1.31 ft/sec, indicative of household ambulator General Gait Details: pt with small lateral steps with BUE support. some facilitation at hips to encourage continued stepping      Modified Rankin (Stroke Patients Only) Modified Rankin (Stroke Patients Only) Pre-Morbid Rankin Score: No symptoms Modified Rankin: Moderately severe disability     Balance Overall balance assessment: Needs assistance Sitting-balance support: Single extremity supported;Feet supported Sitting balance-Leahy Scale: Poor   Postural control: Posterior lean Standing balance support: Single extremity supported;During functional activity Standing balance-Leahy Scale: Poor Standing balance comment: reliant on external support                            Cognition Arousal/Alertness: Awake/alert Behavior During Therapy: Flat affect Overall Cognitive Status: Impaired/Different from baseline Area of Impairment: Orientation;Memory;Following commands;Safety/judgement;Awareness;Problem solving;Attention  Orientation Level: Disoriented  to;Place;Time;Situation Current Attention Level: Focused Memory: Decreased recall of precautions;Decreased short-term memory Following Commands: Follows one step commands consistently;Follows one step commands with increased time Safety/Judgement: Decreased awareness of safety;Decreased awareness of deficits Awareness: Intellectual Problem Solving: Slow processing;Decreased initiation;Difficulty sequencing;Requires verbal cues General Comments: slow processing. reports that Charles Aguilar is 45yo and lives with Charles Aguilar despite report from the pt's Charles Aguilar last session that he has a 74 yo daughter. The pt was able to answer some questions, but required multimodal cues for all sequencing and positioning      Exercises      General Comments General comments (skin integrity, edema, etc.): HR stable BP elevated DBP 132 -115      Pertinent Vitals/Pain Pain Assessment: Faces Faces Pain Scale: Hurts little more Pain Location: neck Pain Descriptors / Indicators: Aching Pain Intervention(s): Monitored during session;Other (comment) (light massage)    Home Living Family/patient expects to be discharged to:: Private residence Living Arrangements: Spouse/significant other;Children Available Help at Discharge: Family;Available 24 hours/day Type of Home: Apartment Home Access: Level entry   Home Layout: Two level Home Equipment: None Additional Comments: pt lives with his girlfriend and 9yo daughter in home with level entry but bed/bath upstairs. Pt Charles Aguilar present and states pt can stay with her at d/c. she has a hospital bed on first floor    Prior Function Level of Independence: Independent          PT Goals (current goals can now be found in the care plan section) Acute Rehab PT Goals Patient Stated Goal: none stated PT Goal Formulation: With patient Time For Goal Achievement: 07/22/20 Potential to Achieve Goals: Good Progress towards PT goals: Progressing toward goals    Frequency    Min  4X/week      PT Plan Current plan remains appropriate    Co-evaluation PT/OT/SLP Co-Evaluation/Treatment: Yes Reason for Co-Treatment: Necessary to address cognition/behavior during functional activity;For patient/therapist safety;To address functional/ADL transfers PT goals addressed during session: Mobility/safety with mobility;Balance OT goals addressed during session: Proper use of Adaptive equipment and DME;ADL's and self-care;Strengthening/ROM      AM-PAC PT "6 Clicks" Mobility   Outcome Measure  Help needed turning from your back to your side while in a flat bed without using bedrails?: A Little Help needed moving from lying on your back to sitting on the side of a flat bed without using bedrails?: A Little Help needed moving to and from a bed to a chair (including a wheelchair)?: A Lot Help needed standing up from a chair using your arms (e.g., wheelchair or bedside chair)?: A Lot Help needed to walk in hospital room?: A Lot Help needed climbing 3-5 steps with a railing? : Total 6 Click Score: 13    End of Session Equipment Utilized During Treatment: Gait belt Activity Tolerance: Patient tolerated treatment well Patient left: in bed;with bed alarm set;with call bell/phone within reach;with nursing/sitter in room Nurse Communication: Mobility status PT Visit Diagnosis: Unsteadiness on feet (R26.81);Other abnormalities of gait and mobility (R26.89)     Time: 1532-1600 PT Time Calculation (min) (ACUTE ONLY): 28 min  Charges:  $Therapeutic Activity: 8-22 mins                     Rolm Baptise, PT, DPT   Acute Rehabilitation Department Pager #: [phone]   [name] 07/10/2020, 4:31 PM

## 2020-07-10 NOTE — Progress Notes (Signed)
NAME:  Charles Aguilar, MRN:  [id number], DOB:  1975-09-26, LOS: 4 ADMISSION DATE:  06/25/2020, CONSULTATION DATE:  2/24 REFERRING MD:  Dr. Iver Nestle, CHIEF COMPLAINT:  ICH   Brief History:  45 year old male presenting with headache and AMS found to have ICH and hypertensive emergency.   History of Present Illness:  45 year old male with PMH as below, which is significant for alcoholism. He reportedly drinks vodka continuously. Family does not have an estimate on actual volume. He was in his usual state of health until 2/24 when he arrived to pick his brother up from work while complaining of a headache and dizziness. His brother drove home and the patient slept. Upon arriving home, the patient began to vomit and fell out of the vehicle striking the right side of his face. It was at this point his brother called EMS who transported the patient to [facility] ED. CT scan was done in the ED and demonstrated R ICH with IVF and ventricular dilation. Neurosurgery and neurology consulted. The patient was started on clevidipine for blood pressure management and was scheduled for CT angiogram of the head. PCCM was asked to admit due to poor mentation and concern for poor airway protection.    Past Medical History:   has a past medical history of Epilepsy (HCC).   Significant Hospital Events:  2/24 admit for ICH  Consults:  Neurology Neurosurgery  Procedures:    Significant Diagnostic Tests:  CT head 2/24 > 2.0 cm right basal ganglia/thalamic hemorrhage with intraventricular Extension. Upon further review there is new mild to moderate panventricular dilatation compared to prior exam. MRI Brain 2/25 >>> 1. Motion degraded study. 2. Right basal ganglia/thalamic intraparenchymal hemorrhage decompressing into the ventricular system with large amount of blood within the supratentorial and infratentorial ventricular system, stable. 3. Left frontal approach ventricular drain with the tip within  the frontal horn of the left lateral ventricle with stable size of the ventricular system.  Micro Data:    Antimicrobials:    Interim History / Subjective:  This morning resting comfortably, following commands, wants to keep eyes closed and rest.   Objective   Blood pressure (!) 163/121, pulse (!) 57, temperature (!) 97.5 F (36.4 C), temperature source Axillary, resp. rate 16, height 6\' 2"  (1.88 m), weight 70.1 kg, SpO2 100 %.        Intake/Output Summary (Last 24 hours) at 07/10/2020 0823 Last data filed at 07/10/2020 0800 Gross per 24 hour  Intake 1246.01 ml  Output 1648 ml  Net -401.99 ml   Filed Weights   07/07/20 0731 07/08/20 0400  Weight: 69.9 kg 70.1 kg   Physical Exam Constitutional:      General: He is not in acute distress.    Appearance: He is not toxic-appearing.  HENT:     Head: Normocephalic and atraumatic.     Comments: Right EVD in place with bloody drainage. Now set at 15cmCSF Eyes:     Extraocular Movements: Extraocular movements intact.     Pupils: Pupils are equal, round, and reactive to light.  Cardiovascular:     Rate and Rhythm: Normal rate.     Heart sounds: Normal heart sounds.  Pulmonary:     Breath sounds: Normal breath sounds.  Abdominal:     General: Abdomen is flat.     Palpations: Abdomen is soft.  Genitourinary:    Comments: Catheter in place Skin:    General: Skin is warm and dry.  Neurological:  General: No focal deficit present.     Mental Status: He is alert. He is disoriented.     Comments: No tremulousness     Resolved Hospital Problem list    Respiratory failure  Assessment & Plan:   Critically ill due to Intracranial hemorrhage of R basal ganglia/thalamus. requiring frequent reassessment in neurological function and tight control of BP to prevent neurological decline  Critically ill due to agitation due to ICH and possible alcohol withdrawal requiring titration of Precedex Hypertensive Emergency - now on  enteral sedation  Seizure history: no home medications documented, possibly alcohol related.  Alcohol abuse - did display signs of tremulousness earlier on  Transaminitis: likely secondary to alcohol Hypophosphatemia Hypokalemia  Plan: - Continue Precedex and introduce oral sedation  - Increase enteral sedation - Now at 4 days post bleed, unlikely to have further edema and suspect current exam will likely be the same for some time. Will try to control behavior with enteral medications at much as possible to allow potential transfer to floor.  - Given that bleeding confined to the ventricles, may do well in the long run.   Daily Goals Checklist  Pain/Anxiety/Delirium protocol (if indicated): Precedex. Single dose phenobarb Neuro vitals: every 4 hours AED's: none VAP protocol (if indicated): not intubated.  Respiratory support goals: maintain sat>90% Blood pressure target: SBP <160 acutely, <140 long-term. HCTZ added today.  DVT prophylaxis: heparin tid Nutrition Status: Progress diet GI prophylaxis: not indicated  Fluid status goals: Allow autoregulation Urinary catheter: Can transition to condom catheter Central lines: none Glucose control: euglycemic on no treatment. Mobility/therapy needs: mobilize as mental status allows. Currently needs restraints to prevent self harm.  Antibiotic de-escalation: EVD prophylaxis only Home medication reconciliation: none Daily labs: CBC, BMP Code Status: Full  Family Communication: Will update Disposition: ICU   The patient is critically ill due to acute alcohol withdrawal, hypertensive crisis.  Critical care was necessary to treat or prevent imminent or life-threatening deterioration.  Critical care was time spent personally by me on the following activities: development of treatment plan with patient and/or surrogate as well as nursing, discussions with consultants, evaluation of patient's response to treatment, examination of patient,  obtaining history from patient or surrogate, ordering and performing treatments and interventions, ordering and review of laboratory studies, ordering and review of radiographic studies, pulse oximetry, re-evaluation of patient's condition and participation in multidisciplinary rounds.   Critical Care Time devoted to patient care services described in this note is 35 minutes. This time reflects time of care of this signee Grisell Bissette . This critical care time does not reflect separately billable procedures or procedure time, teaching time or supervisory time of PA/NP/Med student/Med Resident etc but could involve care discussion time.       Lynnell Catalan [facility] 07/10/2020 8:23 AM  Pager: see AMION After hours pager: [phone]  If no response to pager , please call [phone] until 7pm After 7:00 pm call Elink  [phone]      Labs   CBC: Recent Labs  Lab 2020-08-01 1900 08-01-20 1946 07/07/20 0406 07/07/20 0858 07/08/20 0431 07/09/20 0425 07/10/20 0242  WBC 5.9  --   --  8.1 11.2* 16.4* 11.7*  NEUTROABS 3.4  --   --   --   --   --   --   HGB 12.7*   < > 11.9* 11.3* 11.3* 12.4* 11.4*  HCT 38.2*   < > 35.0* 32.4* 32.7* 37.2* 34.1*  MCV  104.9*  --   --  102.2* 102.2* 104.8* 102.4*  PLT 212  --   --  242 200 184 175   < > = values in this interval not displayed.    Basic Metabolic Panel: Recent Labs  Lab 07/17/20 1900 07/17/20 1946 Jul 17, 2020 2007 07-17-20 2105 07/07/20 0406 07/07/20 0858 07/07/20 1611 07/08/20 0431 07/08/20 2114 07/09/20 0425 07/10/20 0242  NA 142   < > 139   < > 145 145  --  140  --  143 134*  K 3.6   < > 4.6   < > 3.1* 3.1*  --  3.5  --  3.2* 3.4*  CL 102  --  108  --   --  106  --  106  --  105 103  CO2 24  --   --   --   --  19*  --  23  --  22 20*  GLUCOSE 90  --  88  --   --  95  --  144*  --  98 148*  BUN 6  --  7  --   --  5*  --  9  --  7 7  CREATININE 0.70  --  0.90  --   --  0.76  --  0.63  --   0.66 0.50*  CALCIUM 8.9  --   --   --   --  8.2*  --  8.7*  --  8.8* 8.8*  MG 1.3*  --   --   --   --   --  2.1 1.9  --  1.9 2.1  PHOS  --   --   --   --   --   --  2.0* 1.1* 3.1  --   --    < > = values in this interval not displayed.   GFR: Estimated Creatinine Clearance: 116.8 mL/min (A) (by C-G formula based on SCr of 0.5 mg/dL (L)). Recent Labs  Lab 07/07/20 0858 07/08/20 0431 07/09/20 0425 07/10/20 0242  WBC 8.1 11.2* 16.4* 11.7*    Liver Function Tests: Recent Labs  Lab Jul 17, 2020 1900  AST 129*  ALT 83*  ALKPHOS 114  BILITOT 0.6  PROT 8.0  ALBUMIN 3.6   No results for input(s): LIPASE, AMYLASE in the last 168 hours. Recent Labs  Lab July 17, 2020 1933  AMMONIA 59*    ABG    Component Value Date/Time   PHART 7.522 (H) 07/07/2020 0406   PCO2ART 25.2 (L) 07/07/2020 0406   PO2ART 98 07/07/2020 0406   HCO3 20.7 07/07/2020 0406   TCO2 21 (L) 07/07/2020 0406   ACIDBASEDEF 1.0 07/07/2020 0406   O2SAT 98.0 07/07/2020 0406     Coagulation Profile: Recent Labs  Lab 07/07/20 0140  INR 1.1    Cardiac Enzymes: No results for input(s): CKTOTAL, CKMB, CKMBINDEX, TROPONINI in the last 168 hours.  HbA1C: Hgb A1c MFr Bld  Date/Time Value Ref Range Status  07/07/2020 06:16 AM 5.7 (H) 4.8 - 5.6 % Final    Comment:    (NOTE) Pre diabetes:          5.7%-6.4%  Diabetes:              >6.4%  Glycemic control for   <7.0% adults with diabetes

## 2020-07-11 LAB — CBC
HCT: 33.6 % — ABNORMAL LOW (ref 39.0–52.0)
Hemoglobin: 12.1 g/dL — ABNORMAL LOW (ref 13.0–17.0)
MCH: 35.1 pg — ABNORMAL HIGH (ref 26.0–34.0)
MCHC: 36 g/dL (ref 30.0–36.0)
MCV: 97.4 fL (ref 80.0–100.0)
Platelets: 217 10*3/uL (ref 150–400)
RBC: 3.45 MIL/uL — ABNORMAL LOW (ref 4.22–5.81)
RDW: 11.9 % (ref 11.5–15.5)
WBC: 8.4 10*3/uL (ref 4.0–10.5)
nRBC: 0 % (ref 0.0–0.2)

## 2020-07-11 LAB — BASIC METABOLIC PANEL
Anion gap: 12 (ref 5–15)
Anion gap: 12 (ref 5–15)
BUN: 8 mg/dL (ref 6–20)
BUN: 11 mg/dL (ref 6–20)
CO2: 23 mmol/L (ref 22–32)
CO2: 21 mmol/L — ABNORMAL LOW (ref 22–32)
Calcium: 9.6 mg/dL (ref 8.9–10.3)
Calcium: 9.6 mg/dL (ref 8.9–10.3)
Chloride: 94 mmol/L — ABNORMAL LOW (ref 98–111)
Chloride: 94 mmol/L — ABNORMAL LOW (ref 98–111)
Creatinine, Ser: 0.59 mg/dL — ABNORMAL LOW (ref 0.61–1.24)
Creatinine, Ser: 0.62 mg/dL (ref 0.61–1.24)
GFR, Estimated: >60 mL/min (ref >60)
GFR, Estimated: >60 mL/min (ref >60)
Glucose, Bld: 112 mg/dL — ABNORMAL HIGH (ref 70–99)
Glucose, Bld: 127 mg/dL — ABNORMAL HIGH (ref 70–99)
Potassium: 3.3 mmol/L — ABNORMAL LOW (ref 3.5–5.1)
Potassium: 4 mmol/L (ref 3.5–5.1)
Sodium: 129 mmol/L — ABNORMAL LOW (ref 135–145)
Sodium: 127 mmol/L — ABNORMAL LOW (ref 135–145)

## 2020-07-11 LAB — VANCOMYCIN, TROUGH: Vancomycin Tr: 14 ug/mL — ABNORMAL LOW (ref 15–20)

## 2020-07-11 MED ORDER — BETHANECHOL CHLORIDE 10 MG PO TABS
10.0000 mg | ORAL_TABLET | Freq: Three times a day (TID) | ORAL | Status: DC
  Administered 2020-07-11 – 2020-07-19 (×19): 10 mg via ORAL
  Filled 2020-07-11 (×22): qty 1

## 2020-07-11 MED ORDER — POTASSIUM CHLORIDE 10 MEQ/100ML IV SOLN
10.0000 meq | INTRAVENOUS | Status: AC
  Administered 2020-07-11 (×6): 10 meq via INTRAVENOUS
  Filled 2020-07-11 (×6): qty 100

## 2020-07-11 NOTE — Progress Notes (Signed)
Subjective: Patient reports that he is more somnolent this morning. EVD stopped draining last night and required tPA to resolve.   Objective: Vital signs in last 24 hours: Temp:  [97.5 F (36.4 C)-98.3 F (36.8 C)] 98.1 F (36.7 C) (03/01 0400) Pulse Rate:  [57-74] 69 (03/01 0600) Resp:  [14-26] 19 (03/01 0600) BP: (138-163)/(103-121) 158/115 (03/01 0600) SpO2:  [94 %-100 %] 100 % (03/01 0600)  Intake/Output from previous day: 02/28 0701 - 03/01 0700 In: 1018.1 [P.O.:240; I.V.:278; IV Piggyback:500.1] Out: 2256 [Urine:2175; Drains:81] Intake/Output this shift: No intake/output data recorded.  Physical Exam: Patient is alert and oriented to self only. He is following simple commands. PERRLA. MAEW against gravity. BUE and BLE 4/5. EVD is patent, with CDI dressing.   Lab Results: Recent Labs    07/10/20 0242 07/11/20 0251  WBC 11.7* 8.4  HGB 11.4* 12.1*  HCT 34.1* 33.6*  PLT 175 217   BMET Recent Labs    07/10/20 0242 07/11/20 0251  NA 134* 129*  K 3.4* 3.3*  CL 103 94*  CO2 20* 23  GLUCOSE 148* 112*  BUN 7 8  CREATININE 0.50* 0.59*  CALCIUM 8.8* 9.6    Studies/Results: No results found.  Assessment/Plan: Patient with declining mental status yesterday likely secondary to a clot obstructing the flow through the EVD nsystem. tPA administered with improved EVD flow. Neurological status has improved, but continues to be more somnolent than yesterday. Will lower EVD to 10 Cm H20. Continue supportive care.    LOS: 5 days     Council Mechanic, DNP, NP-C 07/11/2020, 7:31 AM

## 2020-07-11 NOTE — Progress Notes (Signed)
2215: EVD had 0 output for past 2 hours. Drain dropped and no pulsation present. Dr. Conchita Paris made aware of this finding and this RN concern for drain clotting off again. Per MD, keep drain as is, no scan at this time as long as neuro exam stays consistent. Pt currently alert, orientedx1, and FC. RN will continue to monitor.

## 2020-07-11 NOTE — Progress Notes (Addendum)
NAME:  Charles Aguilar, MRN:  [id number], DOB:  1975/06/24, LOS: 5 ADMISSION DATE:  06/23/2020, CONSULTATION DATE:  2/24 REFERRING MD:  Dr. Iver Nestle, CHIEF COMPLAINT:  ICH   Brief History:  45 year old male presenting with headache and AMS found to have ICH and hypertensive emergency.   History of Present Illness:  45 year old male with PMH as below, which is significant for alcoholism. He reportedly drinks vodka continuously. Family does not have an estimate on actual volume. He was in his usual state of health until 2/24 when he arrived to pick his brother up from work while complaining of a headache and dizziness. His brother drove home and the patient slept. Upon arriving home, the patient began to vomit and fell out of the vehicle striking the right side of his face. It was at this point his brother called EMS who transported the patient to [facility] ED. CT scan was done in the ED and demonstrated R ICH with IVF and ventricular dilation. Neurosurgery and neurology consulted. The patient was started on clevidipine for blood pressure management and was scheduled for CT angiogram of the head. PCCM was asked to admit due to poor mentation and concern for poor airway protection.    Past Medical History:   has a past medical history of Epilepsy (HCC).   Significant Hospital Events:  2/24 admit for ICH  Consults:  Neurology Neurosurgery  Procedures:    Significant Diagnostic Tests:  CT head 2/24 > 2.0 cm right basal ganglia/thalamic hemorrhage with intraventricular Extension. Upon further review there is new mild to moderate panventricular dilatation compared to prior exam. MRI Brain 2/25 >>> 1. Motion degraded study. 2. Right basal ganglia/thalamic intraparenchymal hemorrhage decompressing into the ventricular system with large amount of blood within the supratentorial and infratentorial ventricular system, stable. 3. Left frontal approach ventricular drain with the tip within  the frontal horn of the left lateral ventricle with stable size of the ventricular system.  Micro Data:  none  Antimicrobials:  none  Interim History / Subjective:  More awake today, increasingly oriented. Decreasing Precedex dose.  Required TPA follow occluded drain last pm.   Objective   Blood pressure (!) 144/115, pulse 71, temperature 98.1 F (36.7 C), temperature source Axillary, resp. rate (!) 22, height 6\' 2"  (1.88 m), weight 70.1 kg, SpO2 100 %.        Intake/Output Summary (Last 24 hours) at 07/11/2020 0936 Last data filed at 07/11/2020 0900 Gross per 24 hour  Intake 945.57 ml  Output 2448 ml  Net -1502.43 ml   Filed Weights   07/07/20 0731 07/08/20 0400  Weight: 69.9 kg 70.1 kg   Physical Exam Constitutional:      General: He is not in acute distress. Awake    Appearance: He is not toxic-appearing.  HENT:     Head: Normocephalic and atraumatic.     Comments: Right EVD in place with bloody drainage. Now set at 15cmCSF Eyes:     Extraocular Movements: Extraocular movements intact.     Pupils: Pupils are equal, round, and reactive to light.  Cardiovascular:     Rate and Rhythm: Normal rate.     Heart sounds: Normal heart sounds.  Pulmonary:     Breath sounds: Normal breath sounds.  Abdominal:     General: Abdomen is flat.     Palpations: Abdomen is soft.  Genitourinary:    Comments: Catheter in place Skin:    General: Skin is warm and dry.  Neurological:     General: No focal deficit present.     Mental Status: He is alert. He is oriented. Waving at me. Cooperating with feeding without coughing.    Comments: No tremulousness     Resolved Hospital Problem list    Respiratory failure  Assessment & Plan:   Was critically ill due to Intracranial hemorrhage of R basal ganglia/thalamus. requiring frequent reassessment in neurological function and tight control of BP to prevent neurological decline.  Encephalopathy and agitation due to ICH and possible  alcohol withdrawal requiring titration of Precedex, now improving. Hypertensive Emergency - now on enteral sedation  Seizure history: no home medications documented, possibly alcohol related.  Alcohol abuse - did display signs of tremulousness earlier on  Transaminitis: likely secondary to alcohol Hypophosphatemia Hypokalemia  Plan: - Wean Precedex  - Now at 4 days post bleed, unlikely to have further edema and suspect current exam will likely be the same for some time. Will try to control behavior with enteral medications at much as possible to allow potential transfer to floor.  - Continue EVD drainage.  - Given that bleeding confined to the ventricles, may do well in the long run. - Recheck BMP as this morning sample may have been diluted.    Daily Goals Checklist  Pain/Anxiety/Delirium protocol (if indicated): Precedex Neuro vitals: every 4 hours AED's: none VAP protocol (if indicated): not intubated.  Respiratory support goals: maintain sat>90% Blood pressure target: SBP <160 acutely, <140 long-term. HCTZ added yesterday. Already on 4 agents. Will observe today.  DVT prophylaxis: heparin tid Nutrition Status: Progress diet GI prophylaxis: not indicated  Fluid status goals: Allow autoregulation Urinary catheter: Can transition to condom catheter Central lines: none Glucose control: euglycemic on no treatment. Mobility/therapy needs: mobilize as mental status allows. Currently needs restraints to prevent self harm.  Antibiotic de-escalation: EVD prophylaxis only Home medication reconciliation: none Daily labs: CBC, BMP Code Status: Full  Family Communication: Mother updated on 3/1. Disposition: ICU  Labs   CBC: Recent Labs  Lab 07/15/2020 1900 2020-07-15 1946 07/07/20 0858 07/08/20 0431 07/09/20 0425 07/10/20 0242 07/11/20 0251  WBC 5.9  --  8.1 11.2* 16.4* 11.7* 8.4  NEUTROABS 3.4  --   --   --   --   --   --   HGB 12.7*   < > 11.3* 11.3* 12.4* 11.4* 12.1*  HCT  38.2*   < > 32.4* 32.7* 37.2* 34.1* 33.6*  MCV 104.9*  --  102.2* 102.2* 104.8* 102.4* 97.4  PLT 212  --  242 200 184 175 217   < > = values in this interval not displayed.    Basic Metabolic Panel: Recent Labs  Lab 07/15/2020 1900 15-Jul-2020 1946 07/07/20 0858 07/07/20 1611 07/08/20 0431 07/08/20 2114 07/09/20 0425 07/10/20 0242 07/11/20 0251  NA 142   < > 145  --  140  --  143 134* 129*  K 3.6   < > 3.1*  --  3.5  --  3.2* 3.4* 3.3*  CL 102   < > 106  --  106  --  105 103 94*  CO2 24  --  19*  --  23  --  22 20* 23  GLUCOSE 90   < > 95  --  144*  --  98 148* 112*  BUN 6   < > 5*  --  9  --  7 7 8   CREATININE 0.70   < > 0.76  --  0.63  --  0.66 0.50* 0.59*  CALCIUM 8.9  --  8.2*  --  8.7*  --  8.8* 8.8* 9.6  MG 1.3*  --   --  2.1 1.9  --  1.9 2.1  --   PHOS  --   --   --  2.0* 1.1* 3.1  --   --   --    < > = values in this interval not displayed.   GFR: Estimated Creatinine Clearance: 116.8 mL/min (A) (by C-G formula based on SCr of 0.59 mg/dL (L)). Recent Labs  Lab 07/08/20 0431 07/09/20 0425 07/10/20 0242 07/11/20 0251  WBC 11.2* 16.4* 11.7* 8.4    Liver Function Tests: Recent Labs  Lab 06/18/2020 1900  AST 129*  ALT 83*  ALKPHOS 114  BILITOT 0.6  PROT 8.0  ALBUMIN 3.6   No results for input(s): LIPASE, AMYLASE in the last 168 hours. Recent Labs  Lab 06/19/2020 1933  AMMONIA 59*    ABG    Component Value Date/Time   PHART 7.522 (H) 07/07/2020 0406   PCO2ART 25.2 (L) 07/07/2020 0406   PO2ART 98 07/07/2020 0406   HCO3 20.7 07/07/2020 0406   TCO2 21 (L) 07/07/2020 0406   ACIDBASEDEF 1.0 07/07/2020 0406   O2SAT 98.0 07/07/2020 0406     Coagulation Profile: Recent Labs  Lab 07/07/20 0140  INR 1.1    Cardiac Enzymes: No results for input(s): CKTOTAL, CKMB, CKMBINDEX, TROPONINI in the last 168 hours.  HbA1C: Hgb A1c MFr Bld  Date/Time Value Ref Range Status  07/07/2020 06:16 AM 5.7 (H) 4.8 - 5.6 % Final    Comment:    (NOTE) Pre diabetes:           5.7%-6.4%  Diabetes:              >6.4%  Glycemic control for   <7.0% adults with diabetes     Lynnell Catalan, MD [facility] ICU Physician [facility]  Pager: [phone] Mobile: [phone] After hours: [phone].

## 2020-07-11 NOTE — Progress Notes (Signed)
Inpatient Rehab Admissions Coordinator:   Pt. Remains agitated, in restraints. Not ready for CIR at this time. Will follow for potential admit pending medical readiness and bed availability.   Megan Salon, MS, CCC-SLP Rehab Admissions Coordinator  [phone] (celll) [phone] (office)

## 2020-07-11 NOTE — Progress Notes (Addendum)
STROKE TEAM PROGRESS NOTE   INTERVAL HISTORY His EVD stopped draining over night requiring an additional 2 mg tPA to be given, this is the second time EVD has stopped draining. EVD is currently draining again, however, given continued drowsiness EVD was lowered to 10 cm H2O. Oriented to self. Able to follow all commands. B/l  eyes conjugate position, pupils equal and reactive to light.  Pt can lift all extremities against gravity.  Blood pressure adequately controlled.. Afebrile. K was repleted and currently is 4.0. . On Vancomycin for surgical prophylaxis. CCM and Neurosurgery are following.  Vitals:   07/11/20 0951 07/11/20 1000 07/11/20 1100 07/11/20 1200  BP: (!) 131/96 (!) 140/103 (!) 138/106 (!) 130/100  Pulse:  73 72 71  Resp:  18 18 17   Temp:    97.6 F (36.4 C)  TempSrc:    Axillary  SpO2:  100% 100% 100%  Weight:      Height:       CBC:  Recent Labs  Lab 08/05/20 1900 08/05/2020 1946 07/10/20 0242 07/11/20 0251  WBC 5.9   < > 11.7* 8.4  NEUTROABS 3.4  --   --   --   HGB 12.7*   < > 11.4* 12.1*  HCT 38.2*   < > 34.1* 33.6*  MCV 104.9*   < > 102.4* 97.4  PLT 212   < > 175 217   < > = values in this interval not displayed.   Basic Metabolic Panel:  Recent Labs  Lab 07/08/20 0431 07/08/20 2114 07/09/20 0425 07/10/20 0242 07/11/20 0251 07/11/20 1005  NA 140  --  143 134* 129* 127*  K 3.5  --  3.2* 3.4* 3.3* 4.0  CL 106  --  105 103 94* 94*  CO2 23  --  22 20* 23 21*  GLUCOSE 144*  --  98 148* 112* 127*  BUN 9  --  7 7 8 11   CREATININE 0.63  --  0.66 0.50* 0.59* 0.62  CALCIUM 8.7*  --  8.8* 8.8* 9.6 9.6  MG 1.9  --  1.9 2.1  --   --   PHOS 1.1* 3.1  --   --   --   --    Lipid Panel:  Recent Labs  Lab 07/07/20 0523  CHOL 225*  TRIG 68  HDL 132  CHOLHDL 1.7  VLDL 14  LDLCALC 79   HgbA1c:  Recent Labs  Lab 07/07/20 0616  HGBA1C 5.7*   Urine Drug Screen:  Recent Labs  Lab 07/07/20 0125  LABOPIA NONE DETECTED  COCAINSCRNUR NONE DETECTED  LABBENZ  POSITIVE*  AMPHETMU NONE DETECTED  THCU NONE DETECTED  LABBARB NONE DETECTED    Alcohol Level  Recent Labs  Lab August 05, 2020 1915  ETH 242*    IMAGING past 24 hours No results found.  PHYSICAL EXAM GENERAL: Drowsy but follows simple commands.  HEENT: - Normocephalic EVD dressing in place on Right side, dry mucous membranes. LUNGS - Symmetrical chest rise, No labored breathing noted CV - no JVD, No Peripheral Edema ABDOMEN - Soft, nondistended  Ext: warm, well perfused, intact peripheral pulses, no Peripheral edema  NEURO Exam:  Remains mostly unchanged. Mental Status: Awake and Alert, Follows simple commands.  Language: fluent Cranial Nerves:   CN II Pupils equal and reactive to light, no VF deficits.   CN III,IV,VI EOM intact, no gaze preference or deviation, no nystagmus.   CN V normal sensation in V1, V2, and V3 segments bilaterally.  CN VII Mild left lower face weakness with significant swelling of face and lips.   CN VIII normal hearing to speech.   CN IX & X Gag+   CN XI Head turns freely with good strength   CN XII midline tongue protrusion   Exam was limited due to continued drowsiness. Motor: Strength good in all extremities. Tone: is normal and bulk is normal. Sensation- Intact to light touch b/l Coordination: unable to test. Gait- unable to test.   ASSESSMENT/PLAN  Charles Aguilar is a 45 y.o. male with history of heavy alcohol, abuse, tobacco abuse (3 to 40 cigarettes daily), TIA presented with Headache, dizziness, vomiting and progressive decreased level of consciousness and a fall. 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 CTA Head and Neck showed no large medium vessel occlusion or stenosis. No  increase in size of the right basal ganglia hemorrhage. Slightly more intraventricular blood particularly in Rt lateral Ventricle than was seen previously.  BP was controlled by Claviprex drip and labetalol PRN. HbA1c 5.7, LDL 79, Echo showed  EF 55-60%. Pt recommends CIR. Etiology of ICH and IVH is likely due to hypertension with risk factors of alcohol and smoking. MRI showed stable hematoma, blood pressure goal will relax to less than 160. EVD per neurosurgery. CIWA protocol, avoid DT. On B1/FA/multivitamin.   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 "There is new mild to moderate pan ventricular dilatation compared to prior exam."  CTA head & neck - No large or medium vessel occlusion or stenosis. No  increase in size of the right basal ganglia hemorrhage. Slight enlargement in ventricular size when compared to previous study.  MRI stable hematoma and IVH, stable size of ventricular system  2D Echo EF 55-60%  LDL 79  HgbA1c 5.7  VTE prophylaxis - SCD's now (due to ICH)  No antithrombotic prior to admission, now on No antithrombotic due to ICH  Therapy recommendations: CIR  Disposition: Not medically ready for CIR at this time  Hydrocephalous  Off 3% Normal Saline  Neurosurgery is following  IVC placed on 2/25  IVC obstructed again- Neurosurgery infused 2 mg of tPA on 2/28 (happened previously on 2/27)  Due to continued drowsiness EVD was lowered to 10 cm H20  On Precedex now.  Hypertension  Home meds:  None  Off claviprex  BP goal systolic <160 mmHg  Long-term BP goal normotensive  Labetalol PRN   Amlodipine 10 mg daily  Carvedilol 12.5 mg BID  HCTZ 12.5 mg daily  Hyperlipidemia  Home meds:  None.  LDL 79, goal < 70  Consider Atorvastatin 40 mg on Discharge  Smoker  Uses 3-40 cigarettes /day. Cigar use.  May offer Nicotine patch when stabalize.  Smoking cessation education will be provided.   Alcohol Use  Heavy daily alcohol/liquor  use.  Continue Thiamine/MVI/ Folic acid  CIWA protocol.  Hypokalemia (resolved)  Post repletion K is 4.0  Other Stroke Risk Factors  Hx of TIA  Hx of seizure - likely alcohol related??   Hospital day # 5  Continue strict blood pressure control and ventriculostomy drainage for IVH should neurosurgery help.  Mobilize out of bed.  Physical Occupational Therapy consults.  Rehab consult.This patient is critically ill and at significant risk of neurological worsening,  death and care requires constant monitoring of vital signs, hemodynamics,respiratory and cardiac monitoring, extensive review of multiple databases, frequent neurological assessment, discussion with family, other specialists and medical decision making of high complexity.I have made any additions or clarifications directly to the above note.This critical care time does not reflect procedure time, or teaching time or supervisory time of PA/NP/Med Resident etc but could involve care discussion time.  I spent 30 minutes of neurocritical care time  in the care of  this patient.    Delia Heady, MD  To contact Stroke Continuity provider, please refer to [email or web address]. After hours, contact General Neurology

## 2020-07-11 NOTE — Progress Notes (Signed)
1730 UOP decreased throughout day, pt abdomen distended; bladder scan revealed >987ml. MD R. Agarwala made aware, orders placed for foley insertion and urecholine.    1800 NP Arelia Longest updated on drain status.

## 2020-07-11 NOTE — Progress Notes (Signed)
Occupational Therapy Treatment Patient Details Name: Charles Aguilar MRN: [id number] DOB: [birth year] Today's Date: 07/11/2020    History of present illness The pt is a 45 yo male presenting from home with headache, dizziness, and progressive altered mentation after multiple syncopal episodes. 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 Pt intubated after arrival (2/24) and extubated 2/26 with placement of L frontal IVC on 2/25.   OT comments  Pt continues to remain lethargic and needs repositioning for arousal. Pt progressed to chair position this session with PROM neck attempted. Recommendation for CIR at this time.    Follow Up Recommendations  CIR    Equipment Recommendations  3 in 1 bedside commode    Recommendations for Other Services Rehab consult    Precautions / Restrictions Precautions Precautions: Fall Precaution Comments: IVC needs to be clamped for mobiltiy Restrictions Weight Bearing Restrictions: No       Mobility Bed Mobility Overal bed mobility: Needs Assistance Bed Mobility: Supine to Sit     Supine to sit: Mod assist;+2 for safety/equipment;HOB elevated     General bed mobility comments: Able to initiate moving LEs to EOB, assist with trunk and rest of LE movement. Left lateral lean initially.    Transfers Overall transfer level: Needs assistance Equipment used: 2 person hand held assist Transfers: Sit to/from Stand Sit to Stand: +2 physical assistance;Mod assist Stand pivot transfers: +2 physical assistance;Mod assist       General transfer comment: Assist to power to standing with pt initiating well with cues. Able to take a few steps to chair towards right side with assist for weight shifting, balance/line management.    Balance Overall balance assessment: Needs assistance Sitting-balance support: Feet supported;No upper extremity supported Sitting balance-Leahy Scale: Poor Sitting balance - Comments:  Requires Min-mod A for support due to left lateral lean. Worked on finding midline with trunk, anterior pelvic tilt, upright posture and cervical neck stretching/positioning/AROM. Able to reach for rail with RUE to maintain balance with Min guard. Posterior lean with LE AROM. Postural control: Posterior lean Standing balance support: During functional activity Standing balance-Leahy Scale: Poor Standing balance comment: reliant on external support                           ADL either performed or assessed with clinical judgement   ADL Overall ADL's : Needs assistance/impaired                     Lower Body Dressing: Total assistance                 General ADL Comments: pt lethargic but able to progress to eOB then chair positioning     Vision       Perception     Praxis      Cognition Arousal/Alertness: Lethargic;Awake/alert Behavior During Therapy: Flat affect Overall Cognitive Status: Impaired/Different from baseline Area of Impairment: Attention;Memory;Following commands;Safety/judgement;Awareness;Problem solving                   Current Attention Level: Focused (with moments of sustained) Memory: Decreased recall of precautions;Decreased short-term memory Following Commands: Follows one step commands with increased time;Follows one step commands consistently Safety/Judgement: Decreased awareness of safety;Decreased awareness of deficits Awareness: Intellectual Problem Solving: Slow processing;Decreased initiation;Requires verbal cues;Requires tactile cues General Comments: Eyes remained closed for most of session but able to open them to command. Answering questions appropriately with repetition. Oriented x4. Slow processing. Multimodal  cuing for sequencing and mobility.        Exercises Exercises: Other exercises Other Exercises Other Exercises: Tolerated AROM/stretching of cervical spine in all directions. Difficulty with right  SB. Other Exercises: passive stretch with towel placed at L side of neck to help with positining toward midline   Shoulder Instructions       General Comments mother Charles Aguilar present VSS on RA    Pertinent Vitals/ Pain       Pain Assessment: Faces Faces Pain Scale: Hurts a little bit Pain Location: neck with stretching Pain Descriptors / Indicators: Grimacing Pain Intervention(s): Monitored during session;Repositioned  Home Living                                          Prior Functioning/Environment              Frequency  Min 2X/week        Progress Toward Goals  OT Goals(current goals can now be found in the care plan section)  Progress towards OT goals: Progressing toward goals  Acute Rehab OT Goals Patient Stated Goal: none stated OT Goal Formulation: Patient unable to participate in goal setting Time For Goal Achievement: 07/18/20 Potential to Achieve Goals: Good ADL Goals Pt Will Perform Grooming: with min assist;sitting Pt Will Transfer to Toilet: with mod assist;with transfer board;bedside commode Additional ADL Goal #1: Pt will complete bed mobility min (A) as precursor to adls. Additional ADL Goal #2: Pt will follow 2 step commands 50% of session  Plan Discharge plan remains appropriate    Co-evaluation    PT/OT/SLP Co-Evaluation/Treatment: Yes Reason for Co-Treatment: For patient/therapist safety;To address functional/ADL transfers PT goals addressed during session: Mobility/safety with mobility;Balance OT goals addressed during session: ADL's and self-care;Proper use of Adaptive equipment and DME;Strengthening/ROM      AM-PAC OT "6 Clicks" Daily Activity     Outcome Measure   Help from another person eating meals?: A Lot Help from another person taking care of personal grooming?: A Lot Help from another person toileting, which includes using toliet, bedpan, or urinal?: A Lot Help from another person bathing (including  washing, rinsing, drying)?: A Lot Help from another person to put on and taking off regular upper body clothing?: A Lot Help from another person to put on and taking off regular lower body clothing?: A Lot 6 Click Score: 12    End of Session Equipment Utilized During Treatment: Gait belt  OT Visit Diagnosis: Unsteadiness on feet (R26.81);Muscle weakness (generalized) (M62.81)   Activity Tolerance Patient tolerated treatment well   Patient Left in chair;with call bell/phone within reach;with chair alarm set;with family/visitor present   Nurse Communication Mobility status;Precautions        Time: 7121-9758 OT Time Calculation (min): 23 min  Charges: OT General Charges $OT Visit: 1 Visit OT Treatments $Self Care/Home Management : 8-22 mins   Brynn, OTR/L  Acute Rehabilitation Services Pager: [phone] Office: [phone] .    Mateo Flow 07/11/2020, 4:34 PM

## 2020-07-11 NOTE — Progress Notes (Signed)
AM K+ 3.3 with creat 0.59 and GFR > 60. ELink CCM electrolyte protocol initiated.

## 2020-07-11 NOTE — Progress Notes (Signed)
Physical Therapy Treatment Patient Details Name: Charles Aguilar MRN: [id number] DOB: 12/06/1975 Today's Date: 07/11/2020    History of Present Illness The pt is a 45 yo male presenting from home with headache, dizziness, and progressive altered mentation after multiple syncopal episodes. 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 Pt intubated after arrival (2/24) and extubated 2/26 with placement of L frontal IVC on 2/25.    PT Comments    Patient progressing slowly towards PT goals. Continues to be sleepy during session but able to interact and answer questions appropriately with slow processing, repetition and increased time. Requires Mod A for bed mobility and Mod A of 2 for standing and taking a few steps to get to chair. Worked on sitting balance, cervical AROM/stretching, finding midline posture and overall stability. Pt favors left cervical side-bend but able to get to neutral positioning with stretching/AROM but not sustain. Mobility limited due to arousal. Charles Aguilar continue to follow acutely.   Follow Up Recommendations  CIR     Equipment Recommendations  Other (comment) (defer to post acute)    Recommendations for Other Services       Precautions / Restrictions Precautions Precautions: Fall Precaution Comments: IVC needs to be clamped for mobility, SBP <160. Restrictions Weight Bearing Restrictions: No    Mobility  Bed Mobility Overal bed mobility: Needs Assistance Bed Mobility: Supine to Sit     Supine to sit: Mod assist;+2 for safety/equipment;HOB elevated     General bed mobility comments: Able to initiate moving LEs to EOB, assist with trunk and rest of LE movement. Left lateral lean initially.    Transfers Overall transfer level: Needs assistance Equipment used: 2 person hand held assist Transfers: Sit to/from Stand Sit to Stand: +2 physical assistance;Mod assist Stand pivot transfers: +2 physical assistance;Mod  assist       General transfer comment: Assist to power to standing with pt initiating well with cues. Able to take a few steps to chair towards right side with assist for weight shifting, balance/line management.  Ambulation/Gait             General Gait Details: Deferred due to lethargy   Stairs             Wheelchair Mobility    Modified Rankin (Stroke Patients Only) Modified Rankin (Stroke Patients Only) Pre-Morbid Rankin Score: No symptoms Modified Rankin: Moderately severe disability     Balance Overall balance assessment: Needs assistance Sitting-balance support: Feet supported;No upper extremity supported Sitting balance-Leahy Scale: Poor Sitting balance - Comments: Requires Min-mod A for support due to left lateral lean. Worked on finding midline with trunk, anterior pelvic tilt, upright posture and cervical neck stretching/positioning/AROM. Able to reach for rail with RUE to maintain balance with Min guard. Posterior lean with LE AROM. Postural control: Posterior lean Standing balance support: During functional activity Standing balance-Leahy Scale: Poor Standing balance comment: reliant on external support                            Cognition Arousal/Alertness: Lethargic;Awake/alert Behavior During Therapy: Flat affect Overall Cognitive Status: Impaired/Different from baseline Area of Impairment: Attention;Memory;Following commands;Safety/judgement;Awareness;Problem solving                   Current Attention Level: Focused (with moments of sustained) Memory: Decreased recall of precautions;Decreased short-term memory Following Commands: Follows one step commands with increased time;Follows one step commands consistently Safety/Judgement: Decreased awareness of safety;Decreased awareness of deficits Awareness:  Intellectual Problem Solving: Slow processing;Decreased initiation;Requires verbal cues;Requires tactile cues General  Comments: Eyes remained closed for most of session but able to open them to command. Answering questions appropriately with repetition. Oriented x4. Slow processing. Multimodal cuing for sequencing and mobility.      Exercises Other Exercises Other Exercises: Tolerated AROM/stretching of cervical spine in all directions. Difficulty with right SB.    General Comments General comments (skin integrity, edema, etc.): Mother, Charles Aguilar, present during session. VSS on RA. Positioned cervical spine more midline while sitting in chair.      Pertinent Vitals/Pain Pain Assessment: Faces Faces Pain Scale: Hurts a little bit Pain Location: neck with stretching Pain Descriptors / Indicators: Grimacing Pain Intervention(s): Monitored during session;Repositioned    Home Living                      Prior Function            PT Goals (current goals can now be found in the care plan section) Progress towards PT goals: Progressing toward goals (slowly)    Frequency    Min 4X/week      PT Plan Current plan remains appropriate    Co-evaluation PT/OT/SLP Co-Evaluation/Treatment: Yes Reason for Co-Treatment: Necessary to address cognition/behavior during functional activity;To address functional/ADL transfers;For patient/therapist safety PT goals addressed during session: Mobility/safety with mobility;Balance        AM-PAC PT "6 Clicks" Mobility   Outcome Measure  Help needed turning from your back to your side while in a flat bed without using bedrails?: A Little Help needed moving from lying on your back to sitting on the side of a flat bed without using bedrails?: A Lot Help needed moving to and from a bed to a chair (including a wheelchair)?: A Lot Help needed standing up from a chair using your arms (e.g., wheelchair or bedside chair)?: A Lot Help needed to walk in hospital room?: A Lot Help needed climbing 3-5 steps with a railing? : Total 6 Click Score: 12    End of  Session Equipment Utilized During Treatment: Gait belt Activity Tolerance: Patient limited by lethargy;Patient tolerated treatment well Patient left: in chair;with call bell/phone within reach;with chair alarm set;with restraints reapplied;with family/visitor present Nurse Communication: Mobility status PT Visit Diagnosis: Unsteadiness on feet (R26.81);Other abnormalities of gait and mobility (R26.89)     Time: 5498-2641 PT Time Calculation (min) (ACUTE ONLY): 23 min  Charges:  $Therapeutic Activity: 8-22 mins                     Vale Haven, PT, DPT Acute Rehabilitation Services Pager [phone] Office [phone]       Blake Divine A Lanier Ensign 07/11/2020, 2:28 PM

## 2020-07-11 NOTE — Progress Notes (Addendum)
Pharmacy Antibiotic Note  Charles Aguilar is a 45 y.o. male admitted on 07/01/2020 with a headache, found to have ICH with hydrocephalus, S/P EVD placement on 07/07/20.  Pharmacy was consulted for vancomycin dosing for prophylaxis.  WBC 8.4, afebrile; Scr 0.62, CrCl 116.8 ml/min (renal function ~stable)  Steady-state vancomycin trough level drawn today ~9.6 hrs after eighth dose of vancomycin 1250 mg IV 12 hr regimen was 14 mg/L, which is within the goal range for this pt. Although the level was not a true trough level because this morning's vancomycin dose was given late, the true trough is estimated to be within the goal range of 10-15 mg/L.  Plan: Continue vancomycin 1250 mg IV Q12H (goal trough of 10-15 mg/L) Monitor WBC, temp, clinical progress, renal function, vancomycin trough as indicated if pt remains on vancomycin therapy  Height: 6\' 2"  (188 cm) Weight: 70.1 kg (154 lb 8.7 oz) IBW/kg (Calculated) : 82.2  Temp (24hrs), Avg:98 F (36.7 C), Min:97.6 F (36.4 C), Max:98.2 F (36.8 C)  Recent Labs  Lab 07/07/20 0858 07/08/20 0431 07/09/20 0425 07/10/20 0242 07/11/20 0251 07/11/20 1005 07/11/20 1438  WBC 8.1 11.2* 16.4* 11.7* 8.4  --   --   CREATININE 0.76 0.63 0.66 0.50* 0.59* 0.62  --   VANCOTROUGH  --   --   --   --   --   --  14*    Estimated Creatinine Clearance: 116.8 mL/min (by C-G formula based on SCr of 0.62 mg/dL).    Allergies  Allergen Reactions  . Penicillins Shortness Of Breath and Swelling    Has patient had a PCN reaction causing immediate rash, facial/tongue/throat swelling, SOB or lightheadedness with hypotension: yes Has patient had a PCN reaction causing severe rash involving mucus membranes or skin necrosis: no Has patient had a PCN reaction that required hospitalization: unknown Has patient had a PCN reaction occurring within the last 10 years: no If all of the above answers are "NO", then may proceed with Cephalosporin use.     09/10/20, PharmD, BCPS, [facility] Clinical Pharmacist 07/11/2020, 4:09 PM

## 2020-07-11 DEATH — deceased

## 2020-07-12 ENCOUNTER — Inpatient Hospital Stay (HOSPITAL_COMMUNITY): Payer: Medicaid Other

## 2020-07-12 LAB — BASIC METABOLIC PANEL
Anion gap: 12 (ref 5–15)
BUN: 10 mg/dL (ref 6–20)
CO2: 24 mmol/L (ref 22–32)
Calcium: 10.1 mg/dL (ref 8.9–10.3)
Chloride: 90 mmol/L — ABNORMAL LOW (ref 98–111)
Creatinine, Ser: 0.64 mg/dL (ref 0.61–1.24)
GFR, Estimated: >60 mL/min (ref >60)
Glucose, Bld: 111 mg/dL — ABNORMAL HIGH (ref 70–99)
Potassium: 3.3 mmol/L — ABNORMAL LOW (ref 3.5–5.1)
Sodium: 126 mmol/L — ABNORMAL LOW (ref 135–145)

## 2020-07-12 MED ORDER — POTASSIUM CHLORIDE 10 MEQ/100ML IV SOLN
10.0000 meq | INTRAVENOUS | Status: AC
Start: 2020-07-12 — End: 2020-07-12
  Administered 2020-07-12 (×4): 10 meq via INTRAVENOUS
  Filled 2020-07-12 (×3): qty 100

## 2020-07-12 MED ORDER — POTASSIUM CHLORIDE CRYS ER 20 MEQ PO TBCR
20.0000 meq | EXTENDED_RELEASE_TABLET | ORAL | Status: AC
  Administered 2020-07-12 (×2): 20 meq via ORAL
  Filled 2020-07-12 (×2): qty 1

## 2020-07-12 MED ORDER — LEVETIRACETAM IN NACL 1000 MG/100ML IV SOLN
1000.0000 mg | Freq: Two times a day (BID) | INTRAVENOUS | Status: DC
  Administered 2020-07-12 – 2020-07-13 (×2): 1000 mg via INTRAVENOUS
  Filled 2020-07-12 (×2): qty 100

## 2020-07-12 MED ORDER — LORAZEPAM 2 MG/ML IJ SOLN
INTRAMUSCULAR | Status: AC
  Filled 2020-07-12: qty 2

## 2020-07-12 MED ORDER — HYDRALAZINE HCL 20 MG/ML IJ SOLN
10.0000 mg | INTRAMUSCULAR | Status: DC | PRN
  Administered 2020-07-12 – 2020-07-14 (×3): 10 mg via INTRAVENOUS
  Filled 2020-07-12 (×4): qty 1

## 2020-07-12 NOTE — Progress Notes (Signed)
PT Cancellation Note  Patient Details Name: Charles Aguilar MRN: [id number] DOB: [birth year]   Cancelled Treatment:    Reason Eval/Treat Not Completed: Medical issues which prohibited therapy - pt just got back from drain procedure, drain clogged overnight. Critical care at bedside, PT to check back as appropriate.  Marye Round, PT Acute Rehabilitation Services Pager [phone]  Office [phone]    Truddie Coco 07/12/2020, 10:40 AM

## 2020-07-12 NOTE — Progress Notes (Signed)
EEG completed, results pending. 

## 2020-07-12 NOTE — Progress Notes (Addendum)
STROKE TEAM PROGRESS NOTE   INTERVAL HISTORY His EVD clogged again overnight. Mentation continues to wax and wane. Initially on interview could not answer questions but later was able to state he has no pain at present. But was unable to answer much more than yes/no questions during neuro exam. Blood pressure has been adequately controlled. K was low again and will be repleted. Neurosurgery and CCM are following.  Vitals:   07/12/20 0400 07/12/20 0500 07/12/20 0600 07/12/20 0700  BP: (!) 172/108 (!) 155/112 (!) 160/107 (!) 144/119  Pulse: 70 72 76 74  Resp: 16 18 (!) 21 19  Temp: 98.3 F (36.8 C)     TempSrc: Axillary     SpO2: 100% 99% 100% 99%  Weight:      Height:       CBC:  Recent Labs  Lab Jul 24, 2020 1900 2020-07-24 1946 07/10/20 0242 07/11/20 0251  WBC 5.9   < > 11.7* 8.4  NEUTROABS 3.4  --   --   --   HGB 12.7*   < > 11.4* 12.1*  HCT 38.2*   < > 34.1* 33.6*  MCV 104.9*   < > 102.4* 97.4  PLT 212   < > 175 217   < > = values in this interval not displayed.   Basic Metabolic Panel:  Recent Labs  Lab 07/08/20 0431 07/08/20 2114 07/09/20 0425 07/10/20 0242 07/11/20 0251 07/11/20 1005 07/12/20 0201  NA 140  --  143 134*   < > 127* 126*  K 3.5  --  3.2* 3.4*   < > 4.0 3.3*  CL 106  --  105 103   < > 94* 90*  CO2 23  --  22 20*   < > 21* 24  GLUCOSE 144*  --  98 148*   < > 127* 111*  BUN 9  --  7 7   < > 11 10  CREATININE 0.63  --  0.66 0.50*   < > 0.62 0.64  CALCIUM 8.7*  --  8.8* 8.8*   < > 9.6 10.1  MG 1.9  --  1.9 2.1  --   --   --   PHOS 1.1* 3.1  --   --   --   --   --    < > = values in this interval not displayed.   Lipid Panel:  Recent Labs  Lab 07/07/20 0523  CHOL 225*  TRIG 68  HDL 132  CHOLHDL 1.7  VLDL 14  LDLCALC 79   HgbA1c:  Recent Labs  Lab 07/07/20 0616  HGBA1C 5.7*   Urine Drug Screen:  Recent Labs  Lab 07/07/20 0125  LABOPIA NONE DETECTED  COCAINSCRNUR NONE DETECTED  LABBENZ POSITIVE*  AMPHETMU NONE DETECTED  THCU NONE  DETECTED  LABBARB NONE DETECTED    Alcohol Level  Recent Labs  Lab July 24, 2020 1915  ETH 242*    IMAGING past 24 hours No results found.  PHYSICAL EXAM Blood pressure (!) 142/112, pulse 75, temperature 98.4 F (36.9 C), temperature source Oral, resp. rate 19, height 6\' 2"  (1.88 m), weight 70.1 kg, SpO2 100 %.  Exam limited due to mentation  General- Drowsy but able to be aroused and oriented only to self. Able to follow some simple commands.  Lungs- Symmetrical chest rise, no labored breathing  Cardio- Regular rate and rhythm  Abdomen- Soft, nondistended  Neuro-patient is drowsy and sedated.  He opens eyes easily and follows simple one-step midline commands.  Speech is nonfluent and speaks only occasional words.  Cranial Nerves:   CN II Pupils equal and reactive to light.   CN III,IV,VI Could not test   CN V Reports absent sensation in V1, V2, and V3 segments bilaterally.   CN VII lower face weakness .   CN VIII normal hearing to speech.   CN IX & X Gag+   CN XI Head turns freely with good strength to left.   CN XII midline tongue protrusion   Motor: able to move all limbs against gravity but could not test further. Tone: is normal and bulk is normal. Sensation- Reports that he cannot feel light touch across body Coordination: unable to test. Gait- deferred.    ASSESSMENT/PLAN  [name] is a 45 y.o. male with history of heavy alcohol, abuse, tobacco abuse (3 to 40 cigarettes daily), TIA presented with Headache, dizziness, vomiting and progressive decreased level of consciousness and a fall. Ct Head showed 2.0 cm right basal ganglia/thalamic hemorrhage with intraventricular extension concerning for hydrocephalus - s/p EVD placement.CTA Head and Neck showed no large medium vessel occlusion or stenosis. No increase in size of the right basal ganglia hemorrhage. Slightly more intraventricular blood particularly in Rt lateral Ventricle than was seen  previously. BP wascontrolled by Claviprex drip and labetalol PRN. HbA1c 5.7, LDL 79, Echo showedEF 55-60%. PT recommends CIR. Etiology of ICH and IVH is likely due to hypertension with risk factors of alcohol and smoking. MRI showed stable hematoma, blood pressure goal will relax to less than 160. CIWA protocol, avoid DT. On B1/FA/multivitamin. Mentation continues to wax and wane.    He states that he is unable to feel light touch across his body but unsure if this due to confusion or not. EVD clogged for third time. Neurosurgery will continue to mange the drain, currently on 10 cm H2O. Will stay in ICU until EVD is able to be removed. Will repeat labs.   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 "There is new mild to moderate pan ventriculardilatation compared to prior exam."  CTA head &neck - No large or medium vessel occlusion or stenosis. No increase in size of the right basal ganglia hemorrhage. Slight enlargement in ventricular size when compared to previous study.  MRI stable hematoma and IVH, stable size of ventricular system  2D EchoEF 55-60%  LDL79  HgbA1c5.7  VTE prophylaxis - SCD's now (due to ICH)  No antithromboticprior to admission, now on No antithromboticdue to ICH  Therapy recommendations:CIR  Disposition: Not medically ready for CIR at this time  Hydrocephalous  Off 3% Normal Saline  Neurosurgery is following  IVC placed on 2/25  IVC obstructed again- (happened previously on 2/27 and 2/28)  Due to continued drowsiness EVD was lowered to 10 cm H20  On Precedex now.  Hypertension  Home meds: None  Off claviprex  BP goal systolic <160 mmHg  Long-term BP goal normotensive  Labetalol PRN   Amlodipine 10 mg daily  Carvedilol 12.5 mg BID  HCTZ 12.5 mg daily  Hyperlipidemia  Home  meds: None.  LDL 79, goal < 70  Consider Atorvastatin 40 mg on Discharge  Smoker  Uses 3-40 cigarettes /day. Cigar use.  May offer Nicotine patch when stabalize.  Smoking cessation education will be provided.   Alcohol Use  Heavy daily alcohol/liquor use.  Continue  Thiamine/MVI/ Folic acid  CIWA protocol.  Hypokalemia  K is 3.3 this AM  Repletion per protocol  Repeat BMP tomorrow AM  Other Stroke Risk Factors  Hx of TIA  Hx of seizure - likely alcohol related??   Hospital day # 6  Patient appears to be stable and is tolerating the EVD being occluded overnight and neurosurgery feel we will leave it as it is for now and repeat CT scan of the head.  Continue strict blood pressure control and ongoing medical management. Discussed with Dr. Venetia Maxon neurosurgery. This patient is critically ill and at significant risk of neurological worsening, death and care requires constant monitoring of vital signs, hemodynamics,respiratory and cardiac monitoring, extensive review of multiple databases, frequent neurological assessment, discussion with family, other specialists and medical decision making of high complexity.I have made any additions or clarifications directly to the above note.This critical care time does not reflect procedure time, or teaching time or supervisory time of PA/NP/Med Resident etc but could involve care discussion time.  I spent 30 minutes of neurocritical care time  in the care of  this patient.  Delia Heady, MD   To contact Stroke Continuity provider, please refer to [email or web address]. After hours, contact General Neurology

## 2020-07-12 NOTE — Progress Notes (Signed)
Per Stroke MD, will hold all PO meds due to AMS. Will continue to manage BP with PRN IV meds. In addition, continue to give seizure medications IV. Will address medication routes tomorrow.   Benson Norway, RN

## 2020-07-12 NOTE — Progress Notes (Signed)
vLTM EEG started following spot EEG (same leads) notified neuro °

## 2020-07-12 NOTE — Progress Notes (Signed)
K+ 3.3, Creatinine 0.64, GFR >60. Replaced per eLink replacement protocol.

## 2020-07-12 NOTE — Procedures (Signed)
Patient Name: Charles Aguilar  MRN: [id number]  Epilepsy Attending: Charlsie Quest  Referring Physician/Provider: Dr Lynnell Catalan Date: 07/12/2020 Duration: 22.56 mins  Patient history: 45yo M with right ICH. EEG to evaluate for seizure  Level of alertness: Awake  AEDs during EEG study: LEV  Technical aspects: This EEG study was done with scalp electrodes positioned according to the 10-20 International system of electrode placement. Electrical activity was acquired at a sampling rate of 500Hz  and reviewed with a high frequency filter of 70Hz  and a low frequency filter of 1Hz . EEG data were recorded continuously and digitally stored.   Description: EEG showed continuous generalized 2-3 Hz delta slowing. Sharp transients were also noted in left frontal region. Hyperventilation and photic stimulation were not performed.    ABNORMALITY -Continuous slow, generalized  IMPRESSION: This study is suggestive of severe diffuse encephalopathy, nonspecific etiology. No seizures or definite  epileptiform discharges were seen throughout the recording.  [name] 

## 2020-07-12 NOTE — Progress Notes (Addendum)
Subjective: Patient reports he is oriented to self and following commands. His neurological status continues to wax and wane. EVD occluded overnight.    Objective: Vital signs in last 24 hours: Temp:  [97.6 F (36.4 C)-99.1 F (37.3 C)] 98.3 F (36.8 C) (03/02 0400) Pulse Rate:  [64-79] 74 (03/02 0700) Resp:  [15-30] 19 (03/02 0700) BP: (88-172)/(73-122) 144/119 (03/02 0700) SpO2:  [98 %-100 %] 99 % (03/02 0700)  Intake/Output from previous day: 03/01 0701 - 03/02 0700 In: 1945.6 [P.O.:480; I.V.:212.6; IV Piggyback:1253] Out: 3550 [Urine:3400; Drains:150] Intake/Output this shift: No intake/output data recorded.   Physical Exam:Patient is alert and oriented to self only. He is following simple commands. PERRLA. MAEW against gravity. BUE and BLE 4/5. EVD is occluded, with CDI dressing.   Lab Results: Recent Labs    07/10/20 0242 07/11/20 0251  WBC 11.7* 8.4  HGB 11.4* 12.1*  HCT 34.1* 33.6*  PLT 175 217   BMET Recent Labs    07/11/20 1005 07/12/20 0201  NA 127* 126*  K 4.0 3.3*  CL 94* 90*  CO2 21* 24  GLUCOSE 127* 111*  BUN 11 10  CREATININE 0.62 0.64  CALCIUM 9.6 10.1    Studies/Results: No results found.  Assessment/Plan: Patient with waxing and waning  neurological status. EVD occluded yesterday around 2200 hours. Patient appears to be stable and tolerating the EVD being occluded. Will leave drain as is for now. Will repeat CTH. Continue supportive care. Call with any questions.    LOS: 6 days     Council Mechanic, DNP, NP-C 07/12/2020, 7:36 AM  Will D/C IVC if HCP has not progressed.

## 2020-07-12 NOTE — Care Management (Signed)
Transitions of Care Team following this patient for discharge planning.  Currently, patient not medically stable; will continue to follow as patient progresses.    Paden Senger W. Nishawn Rotan, RN, BSN  Trauma/Neuro ICU Case Manager [phone] 

## 2020-07-12 NOTE — Significant Event (Signed)
Nursing staff reported around 2:45 PM the patient began experiencing convulsive seizures involving the head and twitching which lasted approximately 1 minute.  During the onset of seizure episode, patient had quickly desaturated however recovered quickly.  He was started on Keppra and stat bedside EEG, CT head were ordered.  During our assessment of the patient, he had noticeable eye twitching with persistent torticollis.  He was slow to respond but with somewhat move upper extremities to painful stimuli.  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 This is most likely the culprit for his seizure episode.  Plan: -Continue Keppra -Continue EEG but will most likely benefit from LTM as he is not fully returned to baseline  Jodelle Red, MD IMTS PGY-3

## 2020-07-12 NOTE — Progress Notes (Addendum)
1445:  RN went to assess patient and patient started seizing.  Violent jerking movement in the head and twitching observed.  Seizure lasted about 1 minute.  CCM called to bedside as seizure subsided.  Patient desat and quickly recovered.  Patient on 2 liters nasal cannula at this time.  Neurosurgery and neurology also updated.    1530:  STAT CT completed.  Family updated by CCM.

## 2020-07-12 NOTE — Progress Notes (Signed)
NAME:  Charles Aguilar, MRN:  [id number], DOB:  12-Jun-1975, LOS: 6 ADMISSION DATE:  06/17/2020, CONSULTATION DATE:  2/24 REFERRING MD:  Dr. Iver Nestle, CHIEF COMPLAINT:  ICH   Brief History:  45 year old male presenting with headache and AMS found to have ICH and hypertensive emergency.   History of Present Illness:  45 year old male with PMH as below, which is significant for alcoholism. He reportedly drinks vodka continuously. Family does not have an estimate on actual volume. He was in his usual state of health until 2/24 when he arrived to pick his brother up from work while complaining of a headache and dizziness. His brother drove home and the patient slept. Upon arriving home, the patient began to vomit and fell out of the vehicle striking the right side of his face. It was at this point his brother called EMS who transported the patient to [facility] ED. CT scan was done in the ED and demonstrated R ICH with IVF and ventricular dilation. Neurosurgery and neurology consulted. The patient was started on clevidipine for blood pressure management and was scheduled for CT angiogram of the head. PCCM was asked to admit due to poor mentation and concern for poor airway protection.    Past Medical History:   has a past medical history of Epilepsy (HCC).   Significant Hospital Events:  2/24 admit for ICH  Consults:  Neurology Neurosurgery  Procedures:    Significant Diagnostic Tests:  CT head 2/24 > 2.0 cm right basal ganglia/thalamic hemorrhage with intraventricular Extension. Upon further review there is new mild to moderate panventricular dilatation compared to prior exam. MRI Brain 2/25 >>> 1. Motion degraded study. 2. Right basal ganglia/thalamic intraparenchymal hemorrhage decompressing into the ventricular system with large amount of blood within the supratentorial and infratentorial ventricular system, stable. 3. Left frontal approach ventricular drain with the tip within  the frontal horn of the left lateral ventricle with stable size of the ventricular system.  Micro Data:  none  Antimicrobials:  Vancomycin for surgical prophylaxis  Interim History / Subjective:  He is able to follow some commands intermittently.   Objective   Blood pressure (!) 160/107, pulse 76, temperature 98.3 F (36.8 C), temperature source Axillary, resp. rate (!) 21, height 6\' 2"  (1.88 m), weight 70.1 kg, SpO2 100 %.        Intake/Output Summary (Last 24 hours) at 07/12/2020 0642 Last data filed at 07/12/2020 0600 Gross per 24 hour  Intake 1945.56 ml  Output 2251 ml  Net -305.44 ml   Filed Weights   07/07/20 0731 07/08/20 0400  Weight: 69.9 kg 70.1 kg   Physical Exam Const: Follow some commands but not oriented to place or time HEENT: EVD with sero-sanguinous drainage, set at 63mm H20 Resp: CTA BL, no wheezes, crackles, rhonchi CV: RRR, no murmurs, gallop, rub Abd: Bowel sounds present, nondistended, nontender to palpation Ext: No lower extremity edema, skin is warm to touch Neuro: Able to raise bilateral upper extremities against gravity. Generally weak. He follows commands but weak phonation.     Resolved Hospital Problem list    Respiratory failure  Assessment & Plan:   Was critically ill due to Intracranial hemorrhage of R basal ganglia/thalamus. requiring frequent reassessment in neurological function and tight control of BP to prevent neurological decline.  Encephalopathy and agitation due to ICH and possible alcohol withdrawal requiring titration of Precedex, now improving. Hypertensive Emergency - now on enteral sedation  Seizure history- no home medications documented, possibly  alcohol related.  Alcohol abuse - did display signs of tremulousness earlier on  Transaminitis- likely secondary to alcohol Hypophosphatemia - resolved Hypokalemia   Plan: It is quite reassuring that his mentation and neuroglial function is improving daily. Once he is able  to tolerate enteral feeding, he will be stable to transfer to CIR  -Off precedex -Continue EVD drainage  -IV potassium repletion  -Appreciate neurology recommendation  -Goal BP <160 -Disposition: CIR   Daily Goals Checklist  Pain/Anxiety/Delirium protocol (if indicated): Precedex Neuro vitals: every 4 hours AED's: none VAP protocol (if indicated): not intubated.  Respiratory support goals: maintain sat>90% Blood pressure target: SBP <160 acutely, <140 long-term. HCTZ added yesterday. Already on 4 agents. Will observe today.  DVT prophylaxis: heparin tid Nutrition Status: Progress diet GI prophylaxis: not indicated  Fluid status goals: Allow autoregulation Urinary catheter: Can transition to condom catheter Central lines: none Glucose control: euglycemic on no treatment. Mobility/therapy needs: mobilize as mental status allows. Currently needs restraints to prevent self harm.  Antibiotic de-escalation: EVD prophylaxis only Home medication reconciliation: none Daily labs: CBC, BMP Code Status: Full  Family Communication: Mother updated on 3/1. Disposition: ICU  Labs   CBC: Recent Labs  Lab 07/08/2020 1900 07/08/20 1946 07/07/20 0858 07/08/20 0431 07/09/20 0425 07/10/20 0242 07/11/20 0251  WBC 5.9  --  8.1 11.2* 16.4* 11.7* 8.4  NEUTROABS 3.4  --   --   --   --   --   --   HGB 12.7*   < > 11.3* 11.3* 12.4* 11.4* 12.1*  HCT 38.2*   < > 32.4* 32.7* 37.2* 34.1* 33.6*  MCV 104.9*  --  102.2* 102.2* 104.8* 102.4* 97.4  PLT 212  --  242 200 184 175 217   < > = values in this interval not displayed.    Basic Metabolic Panel: Recent Labs  Lab Jul 08, 2020 1900 07/08/2020 1946 07/07/20 1611 07/08/20 0431 07/08/20 2114 07/09/20 0425 07/10/20 0242 07/11/20 0251 07/11/20 1005 07/12/20 0201  NA 142   < >  --  140  --  143 134* 129* 127* 126*  K 3.6   < >  --  3.5  --  3.2* 3.4* 3.3* 4.0 3.3*  CL 102   < >  --  106  --  105 103 94* 94* 90*  CO2 24   < >  --  23  --  22 20*  23 21* 24  GLUCOSE 90   < >  --  144*  --  98 148* 112* 127* 111*  BUN 6   < >  --  9  --  7 7 8 11 10   CREATININE 0.70   < >  --  0.63  --  0.66 0.50* 0.59* 0.62 0.64  CALCIUM 8.9   < >  --  8.7*  --  8.8* 8.8* 9.6 9.6 10.1  MG 1.3*  --  2.1 1.9  --  1.9 2.1  --   --   --   PHOS  --   --  2.0* 1.1* 3.1  --   --   --   --   --    < > = values in this interval not displayed.   GFR: Estimated Creatinine Clearance: 116.8 mL/min (by C-G formula based on SCr of 0.64 mg/dL). Recent Labs  Lab 07/08/20 0431 07/09/20 0425 07/10/20 0242 07/11/20 0251  WBC 11.2* 16.4* 11.7* 8.4    Liver Function Tests: Recent Labs  Lab 2020/07/08 1900  AST 129*  ALT 83*  ALKPHOS 114  BILITOT 0.6  PROT 8.0  ALBUMIN 3.6   No results for input(s): LIPASE, AMYLASE in the last 168 hours. Recent Labs  Lab July 22, 2020 1933  AMMONIA 59*    ABG    Component Value Date/Time   PHART 7.522 (H) 07/07/2020 0406   PCO2ART 25.2 (L) 07/07/2020 0406   PO2ART 98 07/07/2020 0406   HCO3 20.7 07/07/2020 0406   TCO2 21 (L) 07/07/2020 0406   ACIDBASEDEF 1.0 07/07/2020 0406   O2SAT 98.0 07/07/2020 0406     Coagulation Profile: Recent Labs  Lab 07/07/20 0140  INR 1.1    Cardiac Enzymes: No results for input(s): CKTOTAL, CKMB, CKMBINDEX, TROPONINI in the last 168 hours.  HbA1C: Hgb A1c MFr Bld  Date/Time Value Ref Range Status  07/07/2020 06:16 AM 5.7 (H) 4.8 - 5.6 % Final    Comment:    (NOTE) Pre diabetes:          5.7%-6.4%  Diabetes:              >6.4%  Glycemic control for   <7.0% adults with diabetes

## 2020-07-12 NOTE — Progress Notes (Signed)
Inpatient Rehab Admissions Coordinator:   I am following for potential CIR admit once medically ready. Pt. Is currently medically unstable. I have also reached out to patient's mother and left voicemail with request for call back to discuss support at d/c.  Megan Salon, MS, CCC-SLP Rehab Admissions Coordinator  [phone] (celll) [phone] (office)

## 2020-07-12 NOTE — Progress Notes (Signed)
  Speech Language Pathology Treatment: Dysphagia  Patient Details Name: Charles Aguilar MRN: [id number] DOB: Jul 12, 1975 Today's Date: 07/12/2020 Time: 3710-6269 SLP Time Calculation (min) (ACUTE ONLY): 19 min  Assessment / Plan / Recommendation Clinical Impression  Pt seen at bedside for skilled ST intervention targeting goals for PO diet tolerance/advancement and cognition, including increasing awareness, attention, and problem solving. Pt was awake and alert upon arrival of SLP, having just finished a bath with nursing. Oral care was completed with suction, which pt tolerated well. Following oral care, pt accepted trials of thin liquids and puree textures. No overt s/s aspiration observed on either consistency given, however, he did have oral residue of puree which was removed with suction. Pt declined more than 3 bites of applesauce and 2 sips of water. Pt was noted to have left gaze preference, and was unable to look past midline despite multimodal cues. Vocal quality was noted to be very low, almost a whisper throughout this session. Pt was unable to demonstrate ability to follow commands or answer questions consistently. Pt reported the date as "18", and was unable to verbalize the month. He was accurate for DOB, but could not tell me his name. Pt was unable to make eye contact with SLP, or track across midline. SLP will continue to follow acutely to maximize cognitive linguistic skills, and advance diet as pt tolerates.  Recommend continuing full liquid diet at this time. Safe swallow precautions were posted at [facility].    HPI HPI: Patient is a 45 y.o. male with PMH: alcoholism (reportedly drinks vodka continuously, 1-2 pints per day) who presented to ED on 2/24 after c/o headache and dizziness while picking up brother from work. His brother then drove patient home and when patient exited car he began to vomit and fell out of car striking right side of his face. His brother then called EMS. CT scan  completed in ED and patient demonstrated Right basal ganglia/thalamic intraparenchymal hemorrhage with intraventricular extension. Pt intubated after arrival (2/24) and extubated 2/26 with placement of L frontal IVC on 2/25.      SLP Plan  Continue with current plan of care       Recommendations  Diet recommendations: Other(comment);Thin liquid (full liquid) Liquids provided via: Cup;Straw Medication Administration: Crushed with puree Supervision: Patient able to self feed;Full supervision/cueing for compensatory strategies;Staff to assist with self feeding Compensations: Minimize environmental distractions;Slow rate;Small sips/bites Postural Changes and/or Swallow Maneuvers: Seated upright 90 degrees                Oral Care Recommendations: Oral care BID Follow up Recommendations: Inpatient Rehab SLP Visit Diagnosis: Dysphagia, unspecified (R13.10);Cognitive communication deficit (R41.841) Plan: Continue with current plan of care       GO               Celia B. Murvin Natal, [facility], CCC-SLP Speech Language Pathologist Office: [phone] Pager: [phone]  Leigh Aurora 07/12/2020, 1:53 PM

## 2020-07-13 LAB — CBC
HCT: 38.1 % — ABNORMAL LOW (ref 39.0–52.0)
Hemoglobin: 13.6 g/dL (ref 13.0–17.0)
MCH: 34.7 pg — ABNORMAL HIGH (ref 26.0–34.0)
MCHC: 35.7 g/dL (ref 30.0–36.0)
MCV: 97.2 fL (ref 80.0–100.0)
Platelets: 264 10*3/uL (ref 150–400)
RBC: 3.92 MIL/uL — ABNORMAL LOW (ref 4.22–5.81)
RDW: 11.9 % (ref 11.5–15.5)
WBC: 10.4 10*3/uL (ref 4.0–10.5)
nRBC: 0 % (ref 0.0–0.2)

## 2020-07-13 LAB — BASIC METABOLIC PANEL
Anion gap: 14 (ref 5–15)
Anion gap: 14 (ref 5–15)
BUN: 13 mg/dL (ref 6–20)
BUN: 16 mg/dL (ref 6–20)
CO2: 20 mmol/L — ABNORMAL LOW (ref 22–32)
CO2: 18 mmol/L — ABNORMAL LOW (ref 22–32)
Calcium: 10 mg/dL (ref 8.9–10.3)
Calcium: 9.6 mg/dL (ref 8.9–10.3)
Chloride: 87 mmol/L — ABNORMAL LOW (ref 98–111)
Chloride: 89 mmol/L — ABNORMAL LOW (ref 98–111)
Creatinine, Ser: 0.62 mg/dL (ref 0.61–1.24)
Creatinine, Ser: 0.64 mg/dL (ref 0.61–1.24)
GFR, Estimated: >60 mL/min (ref >60)
GFR, Estimated: >60 mL/min (ref >60)
Glucose, Bld: 117 mg/dL — ABNORMAL HIGH (ref 70–99)
Glucose, Bld: 107 mg/dL — ABNORMAL HIGH (ref 70–99)
Potassium: 3.3 mmol/L — ABNORMAL LOW (ref 3.5–5.1)
Potassium: 3.7 mmol/L (ref 3.5–5.1)
Sodium: 121 mmol/L — ABNORMAL LOW (ref 135–145)
Sodium: 121 mmol/L — ABNORMAL LOW (ref 135–145)

## 2020-07-13 LAB — SODIUM
Sodium: 122 mmol/L — ABNORMAL LOW (ref 135–145)
Sodium: 123 mmol/L — ABNORMAL LOW (ref 135–145)

## 2020-07-13 LAB — OSMOLALITY: Osmolality: 264 mOsm/kg — ABNORMAL LOW (ref 275–295)

## 2020-07-13 LAB — PHOSPHORUS: Phosphorus: 4.4 mg/dL (ref 2.5–4.6)

## 2020-07-13 LAB — MAGNESIUM: Magnesium: 1.6 mg/dL — ABNORMAL LOW (ref 1.7–2.4)

## 2020-07-13 MED ORDER — MAGNESIUM SULFATE 2 GM/50ML IV SOLN
2.0000 g | Freq: Once | INTRAVENOUS | Status: AC
  Administered 2020-07-13: 2 g via INTRAVENOUS
  Filled 2020-07-13: qty 50

## 2020-07-13 MED ORDER — LEVETIRACETAM IN NACL 500 MG/100ML IV SOLN
500.0000 mg | Freq: Two times a day (BID) | INTRAVENOUS | Status: DC
  Administered 2020-07-13 – 2020-07-14 (×3): 500 mg via INTRAVENOUS
  Filled 2020-07-13 (×3): qty 100

## 2020-07-13 MED ORDER — SODIUM CHLORIDE 3 % IV SOLN
INTRAVENOUS | Status: DC
  Filled 2020-07-13: qty 500

## 2020-07-13 MED ORDER — LOSARTAN POTASSIUM 50 MG PO TABS: 25.0000 mg | ORAL_TABLET | Freq: Every day | ORAL | Status: DC

## 2020-07-13 MED ORDER — CARVEDILOL 12.5 MG PO TABS: 25.0000 mg | ORAL_TABLET | Freq: Two times a day (BID) | ORAL | Status: DC

## 2020-07-13 MED ORDER — POTASSIUM CHLORIDE 10 MEQ/100ML IV SOLN
10.0000 meq | INTRAVENOUS | Status: AC
  Administered 2020-07-13 (×4): 10 meq via INTRAVENOUS
  Filled 2020-07-13 (×4): qty 100

## 2020-07-13 NOTE — Progress Notes (Signed)
LTM EEG maint- no initial skin breakdown - push button tested - neuro notified.

## 2020-07-13 NOTE — Procedures (Addendum)
Patient Name: Charles Aguilar  MRN: [id number]  Epilepsy Attending: Charlsie Quest  Referring Physician/Provider: Dr Lynnell Catalan Duration: 07/12/2020 1615 to 07/13/2020 8182, 9937- [id number]  Patient history: 45yo M with right ICH. EEG to evaluate for seizure  Level of alertness: Awake  AEDs during EEG study: LEV  Technical aspects: This EEG study was done with scalp electrodes positioned according to the 10-20 International system of electrode placement. Electrical activity was acquired at a sampling rate of 500Hz  and reviewed with a high frequency filter of 70Hz  and a low frequency filter of 1Hz . EEG data were recorded continuously and digitally stored.   Description: EEG showed continuous generalized and lateralized left hemisphere 2-3 Hz delta slowing. Sharp transients were also noted in left frontal region. Hyperventilation and photic stimulation were not performed.    Event button was pressed on 07/13/2020 at 1553 for right thumb twitching and gaze deviation to the right.  Concomitant EEG before, during and after the event did not show any EEG change.  ABNORMALITY -Continuous slow, generalized and lateralized left hemisphere  IMPRESSION: This study is suggestive of cortical dysfunction in left hemisphere likely secondary to underlying structural abnormality as well as severe diffuse encephalopathy, nonspecific etiology. No definite  epileptiform discharges were seen throughout the recording.  Event button was pressed on 07/13/2198 1553 for right thumb twitching and gaze deviation to right without concomitant EEG change.  However focal motor seizures may not be seen on scalp EEG.  Clinical correlation is recommended.  Santos Hardwick 

## 2020-07-13 NOTE — Progress Notes (Signed)
Neurologist aware that patient is not safe to swallow at this time and RN holding oral meds.

## 2020-07-13 NOTE — Progress Notes (Signed)
PT Cancellation Note  Patient Details Name: Jabez Molner MRN: [id number] DOB: [birth year]   Cancelled Treatment:    Reason Eval/Treat Not Completed: Medical issues which prohibited therapy today. Pt with minimal responsiveness following seizure yesterday, RN recommended therapies hold for today and progress with POC at later time. PT will continue to follow and re-evaluate tomorrow.   [name], DPT   Acute Rehabilitation Department Pager #: [phone]   [name] 07/13/2020, 5:34 PM

## 2020-07-13 NOTE — Progress Notes (Signed)
OT Cancellation Note  Patient Details Name: Charles Aguilar MRN: [id number] DOB: 1975/06/11   Cancelled Treatment:    Reason Eval/Treat Not Completed: Medical issues which prohibited therapy;Patient not medically ready. Pt with minimal responsiveness following seizure yesterday, RN recommended therapies hold for today and progress with POC at later time. Will return as schedule allows.   [name]  [name] MSOT, OTR/L Acute Rehab Pager: [phone] Office: [phone] 07/13/2020, 5:38 PM

## 2020-07-13 NOTE — Progress Notes (Signed)
EEG maintenance complete. No skin breakdown at FP1 FP2 F7 A1.

## 2020-07-13 NOTE — Plan of Care (Signed)
Repeat Na+ not improving, remains 121 despite holding hydrochlorothiazide this morning.  Starting hypertonic saline at 50 cc/h through PIV. Na+ levels every 2 then every 4 hour per protocol.  Steffanie Dunn, DO 07/13/20 5:57 PM [facility]  From 7AM- 7PM if no response to pager, please call [phone]. After hours, 7PM- 7AM, please call Elink  [phone].

## 2020-07-13 NOTE — Progress Notes (Addendum)
STROKE TEAM PROGRESS NOTE   INTERVAL HISTORY His mother is at the bedside.   Yesterday afternoon he had a reported seizure around 2:45. CT scan was obtained and showed no new changes. He was started on Keppra and had an    Today he is very stuporous. Able to be aroused with noxious stimuli but falls asleep soon after stimuli stops. BMP reveals low Na, K, and Cl. Vitals stable. Discussed electrolyte abnormalities with CCM for management.  CCM and Neurosurgery are following  Vitals:   07/13/20 0300 07/13/20 0400 07/13/20 0500 07/13/20 0600  BP: (!) 143/104 (!) 136/110 (!) 148/105 (!) 150/104  Pulse: 93 96 92 96  Resp:  (!) 23  15  Temp:  99 F (37.2 C)    TempSrc:  Oral    SpO2: 100% 100% 100% 100%  Weight:      Height:       CBC:  Recent Labs  Lab 07/05/2020 1900 06/30/2020 1946 07/10/20 0242 07/11/20 0251  WBC 5.9   < > 11.7* 8.4  NEUTROABS 3.4  --   --   --   HGB 12.7*   < > 11.4* 12.1*  HCT 38.2*   < > 34.1* 33.6*  MCV 104.9*   < > 102.4* 97.4  PLT 212   < > 175 217   < > = values in this interval not displayed.   Basic Metabolic Panel:  Recent Labs  Lab 07/08/20 2114 07/09/20 0425 07/10/20 0242 07/11/20 0251 07/12/20 0201 07/13/20 0458 07/13/20 0639  NA  --    < > 134*   < > 126* 121*  --   K  --    < > 3.4*   < > 3.3* 3.3*  --   CL  --    < > 103   < > 90* 87*  --   CO2  --    < > 20*   < > 24 20*  --   GLUCOSE  --    < > 148*   < > 111* 117*  --   BUN  --    < > 7   < > 10 13  --   CREATININE  --    < > 0.50*   < > 0.64 0.62  --   CALCIUM  --    < > 8.8*   < > 10.1 10.0  --   MG  --    < > 2.1  --   --   --  1.6*  PHOS 3.1  --   --   --   --   --  4.4   < > = values in this interval not displayed.   Lipid Panel:  Recent Labs  Lab 07/07/20 0523  CHOL 225*  TRIG 68  HDL 132  CHOLHDL 1.7  VLDL 14  LDLCALC 79   HgbA1c:  Recent Labs  Lab 07/07/20 0616  HGBA1C 5.7*   Urine Drug Screen:  Recent Labs  Lab 07/07/20 0125  LABOPIA NONE DETECTED   COCAINSCRNUR NONE DETECTED  LABBENZ POSITIVE*  AMPHETMU NONE DETECTED  THCU NONE DETECTED  LABBARB NONE DETECTED    Alcohol Level  Recent Labs  Lab 06/15/2020 1915  ETH 242*    IMAGING past 24 hours CT HEAD WO CONTRAST  Result Date: 07/12/2020 CLINICAL DATA:  Stroke follow-up assess for hydrocephalus. EXAM: CT HEAD WITHOUT CONTRAST TECHNIQUE: Contiguous axial images were obtained from the base of the skull through the vertex  without intravenous contrast. COMPARISON:  Head CT July 12, 2020 at 1017 hours FINDINGS: Brain: Similar volume of intraparenchymal hemorrhage at the right caudothalamic groove with no significant change in the volume of a intraventricular blood products. Unchanged position of the left frontal approach intraventricular drain which terminates in the left lateral ventricle with similar configuration of the ventricular system. Additionally unchanged is the hemorrhage about the catheter with rim of edema. No significant mass effect, midline shift and no evidence of herniation. Vascular: No hyperdense vessel or unexpected calcification. Skull: Negative Sinuses/Orbits: Mucosal thickening of the maxillary sinuses and ethmoid air cells. Other: None IMPRESSION: 1. No significant change in the intraparenchymal hemorrhage at the right caudothalamic groove with similar volume of intraventricular blood products. 2. Unchanged position of the left frontal approach intraventricular drain with similar configuration/volume of the ventricular system. Electronically Signed   By: Maudry Mayhew MD   On: 07/12/2020 16:00   CT HEAD WO CONTRAST  Result Date: 07/12/2020 CLINICAL DATA:  Stroke follow-up EXAM: CT HEAD WITHOUT CONTRAST TECHNIQUE: Contiguous axial images were obtained from the base of the skull through the vertex without intravenous contrast. COMPARISON:  Brain MRI 07/07/2020 FINDINGS: Brain: Significantly decreased intraventricular hemorrhage. Left frontal external ventricular drain which  traverses the left lateral ventricle with tip near the left caudate head. Hemorrhage around the catheter is new, with rim of edema. Decreased hemorrhage at the right caudothalamic groove. No convincing infarct. Vascular: Negative Skull: Negative Sinuses/Orbits: Negative IMPRESSION: 1. Decreased right basal ganglia and intraventricular hemorrhage. 2. Left frontal EVD with interval hemorrhage around the catheter. Ventricular volume is diminished from prior. Electronically Signed   By: Charles Aguilar M.D.   On: 07/12/2020 10:47   EEG adult  Result Date: 07/12/2020 Charles Quest, MD     07/12/2020  5:21 PM Patient Name: Charles Aguilar MRN: [id number] Epilepsy Attending: Charlsie Aguilar Referring Physician/Provider: Dr Lynnell Catalan Date: 07/12/2020 Duration: 22.56 mins Patient history: 45yo M with right ICH. EEG to evaluate for seizure Level of alertness: Awake AEDs during EEG study: LEV Technical aspects: This EEG study was done with scalp electrodes positioned according to the 10-20 International system of electrode placement. Electrical activity was acquired at a sampling rate of 500Hz  and reviewed with a high frequency filter of 70Hz  and a low frequency filter of 1Hz . EEG data were recorded continuously and digitally stored. Description: EEG showed continuous generalized 2-3 Hz delta slowing. Sharp transients were also noted in left frontal region. Hyperventilation and photic stimulation were not performed.  ABNORMALITY -Continuous slow, generalized IMPRESSION: This study is suggestive of severe diffuse encephalopathy, nonspecific etiology. No seizures or definite  epileptiform discharges were seen throughout the recording. Priyanka    PHYSICAL EXAM General: Stuporous, well developed African American male, not in apparent distress  Lungs: Symmetrical chest rise, no labored breathing  Cardio: Regular rate and Rhythm  Abdomen: soft, non distended  Neuro: Unable to be examined due to being  stuporous. Opens eyes partially to stimulus.  Able to move all 4 extremities left side more than right.  Able to move all limbs to noxious stimuli.  ASSESSMENT/PLAN  Charles Aguilar is a 45 y.o. male with history of heavy alcohol, abuse, tobacco abuse (3 to 40 cigarettes daily), TIA presented with Headache, dizziness, vomiting and progressive decreased level of consciousness and a fall. Ct Head showed 2.0 cm right basal ganglia/thalamic hemorrhage with intraventricular extension concerning for hydrocephalus - s/p EVD placement.CTA Head and Neck showed no large medium vessel  occlusion or stenosis. No increase in size of the right basal ganglia hemorrhage. Slightly more intraventricular blood particularly in Rt lateral Ventricle than was seen previously. BP wascontrolled by Claviprex drip and labetalol PRN. HbA1c 5.7, LDL 79, Echo showedEF 55-60%. PT recommends CIR. Etiology of ICH and IVH is likely due to hypertension with risk factors of alcohol and smoking. MRI showed stable hematoma, blood pressure goal will relax to less than 160. CIWA protocol, avoid DT. On B1/FA/multivitamin. Mentation continues to wax and wane.    Yesterday started seizing around 2:45 for approximately 1 minute. EEG showed severe diffuse encephalopathy, nonspecific etiology. No seizures or definite epileptiform discharges were seen throughout the recording. CT revealed no new changes. Given his multiple electrolyte abnormalities thsi could be the cause. CCM will make make changes to address. Was started on Keppra, however, given his size will decrease his Keppra as 1000 mg BID maybe the cause of his stupor. Will decrease Keppra to 500 mg BID.   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 "There is new mild to moderate pan ventriculardilatation compared to prior  exam."  CTA head &neck - No large or medium vessel occlusion or stenosis. No increase in size of the right basal ganglia hemorrhage. Slight enlargement in ventricular size when compared topreviousstudy.  MRI stable hematoma and IVH, stable size of ventricular system  2D EchoEF 55-60%  LDL79  HgbA1c5.7  VTE prophylaxis - SCD's now (due to ICH)  No antithromboticprior to admission, now on No antithromboticdue to ICH  Therapy recommendations:CIR  Disposition:Not medically ready for CIR at this time  Hydrocephalous  Off3% Normal Saline  Neurosurgery is following  IVC placed on 2/25  IVC obstructed on 2/27, 2/28, and 3/1  Due to continued drowsiness EVD was lowered to 10 cm H20  On Precedex now.  Hypertension  Home meds: None  Off claviprex  BP goal systolic <160 mmHg  Long-term BP goal normotensive  Labetalol PRN  Amlodipine 10 mg daily  Increase Carvedilol to 25 mg BID  HCTZ stopped  Hyperlipidemia  Home meds: None.  LDL 79, goal < 70  Consider Atorvastatin 40 mg on Discharge  Smoker  Uses 3-40 cigarettes /day. Cigar use.  May offer Nicotine patch when stabalize.  Smoking cessation education will be provided.   Alcohol Use  Heavy daily alcohol/liquor use.  Continue Thiamine/MVI/ Folic acid  CIWA protocol.  Electrolyte Abnormalities  Discussed with CCM this monring  K is 3.3, Na is 121, Cl is 87  CCM will stop HCTZ and increase Carvedilol  Repeat BMP tomorrow AM  Other Stroke Risk Factors  Hx of TIA  Hx of seizure - likely alcohol related??   Hospital day # 7  Patient appears quite stuporous likely postictal as well as medication effect from Keppra.  Recommend reduce dose of Keppra to 500 mg twice daily.  Continue close neurological monitoring and strict blood pressure control.  Neurosurgery plan to DC ventriculostomy as it has been nonfunctional for 36 hours without significant change in patient's CT  scan appearance of intraventricular hemorrhage and hydrocephalus.  Discussed with Dr. Zoe Lan critical care medicine.  Discussed with patient and mother at the bedside and answered questions. This patient is critically ill and at significant risk of neurological worsening, death and care requires constant monitoring of vital signs, hemodynamics,respiratory and cardiac monitoring, extensive review of  multiple databases, frequent neurological assessment, discussion with family, other specialists and medical decision making of high complexity.I have made any additions or clarifications directly to the above note.This critical care time does not reflect procedure time, or teaching time or supervisory time of PA/NP/Med Resident etc but could involve care discussion time.  I spent 30 minutes of neurocritical care time  in the care of  this patient.  Delia Heady, MD   To contact Stroke Continuity provider, please refer to [email or web address]. After hours, contact General Neurology

## 2020-07-13 NOTE — Progress Notes (Signed)
1553:  Event button on LEEG.  RN noticed right thumb twitching and left gaze. Dr. Melynda Ripple notified.  No new orders at this time.

## 2020-07-13 NOTE — Progress Notes (Signed)
Pt not following commands and lethargic. Called E-link to switch oral meds to IV. MD ordered RN to hold meds until neuro status improves.

## 2020-07-13 NOTE — Progress Notes (Signed)
IP rehab admissions - Noted seizure yesterday and EEG completed.  Need to see how patient participates with therapies over next couple days.  Will follow along for progress.  Call for questions.  [phone]

## 2020-07-13 NOTE — Progress Notes (Signed)
Subjective: Patient reports seizure yesterday. Less responsive on examination this morning. The patient's mother is at bedside.   Objective: Vital signs in last 24 hours: Temp:  [97.9 F (36.6 C)-100.1 F (37.8 C)] 98.4 F (36.9 C) (03/03 0800) Pulse Rate:  [70-101] 80 (03/03 1000) Resp:  [13-50] 15 (03/03 1000) BP: (128-179)/(83-119) 152/117 (03/03 1000) SpO2:  [98 %-100 %] 100 % (03/03 1000)  Intake/Output from previous day: 03/02 0701 - 03/03 0700 In: 1296.9 [IV Piggyback:1296.9] Out: 2000 [Urine:2000] Intake/Output this shift: Total I/O In: 226.3 [IV Piggyback:226.3] Out: -   Physical Exam: Patient responds to pain. He is not following commands and unable to answer orientation questions. He withdrawals to noxious stimuli in his BUE and BLE. PERRLA.   Lab Results: Recent Labs    07/11/20 0251  WBC 8.4  HGB 12.1*  HCT 33.6*  PLT 217   BMET Recent Labs    07/12/20 0201 07/13/20 0458  NA 126* 121*  K 3.3* 3.3*  CL 90* 87*  CO2 24 20*  GLUCOSE 111* 117*  BUN 10 13  CREATININE 0.64 0.62  CALCIUM 10.1 10.0    Studies/Results: CT HEAD WO CONTRAST  Result Date: 07/12/2020 CLINICAL DATA:  Stroke follow-up assess for hydrocephalus. EXAM: CT HEAD WITHOUT CONTRAST TECHNIQUE: Contiguous axial images were obtained from the base of the skull through the vertex without intravenous contrast. COMPARISON:  Head CT July 12, 2020 at 1017 hours FINDINGS: Brain: Similar volume of intraparenchymal hemorrhage at the right caudothalamic groove with no significant change in the volume of a intraventricular blood products. Unchanged position of the left frontal approach intraventricular drain which terminates in the left lateral ventricle with similar configuration of the ventricular system. Additionally unchanged is the hemorrhage about the catheter with rim of edema. No significant mass effect, midline shift and no evidence of herniation. Vascular: No hyperdense vessel or unexpected  calcification. Skull: Negative Sinuses/Orbits: Mucosal thickening of the maxillary sinuses and ethmoid air cells. Other: None IMPRESSION: 1. No significant change in the intraparenchymal hemorrhage at the right caudothalamic groove with similar volume of intraventricular blood products. 2. Unchanged position of the left frontal approach intraventricular drain with similar configuration/volume of the ventricular system. Electronically Signed   By: Maudry Mayhew MD   On: 07/12/2020 16:00   CT HEAD WO CONTRAST  Result Date: 07/12/2020 CLINICAL DATA:  Stroke follow-up EXAM: CT HEAD WITHOUT CONTRAST TECHNIQUE: Contiguous axial images were obtained from the base of the skull through the vertex without intravenous contrast. COMPARISON:  Brain MRI 07/07/2020 FINDINGS: Brain: Significantly decreased intraventricular hemorrhage. Left frontal external ventricular drain which traverses the left lateral ventricle with tip near the left caudate head. Hemorrhage around the catheter is new, with rim of edema. Decreased hemorrhage at the right caudothalamic groove. No convincing infarct. Vascular: Negative Skull: Negative Sinuses/Orbits: Negative IMPRESSION: 1. Decreased right basal ganglia and intraventricular hemorrhage. 2. Left frontal EVD with interval hemorrhage around the catheter. Ventricular volume is diminished from prior. Electronically Signed   By: Marnee Spring M.D.   On: 07/12/2020 10:47   EEG adult  Result Date: 07/12/2020 Charlsie Quest, MD     07/12/2020  5:21 PM Patient Name: Charles Aguilar MRN: [id number] Epilepsy Attending: Charlsie Quest Referring Physician/Provider: Dr Lynnell Catalan Date: 07/12/2020 Duration: 22.56 mins Patient history: 45yo M with right ICH. EEG to evaluate for seizure Level of alertness: Awake AEDs during EEG study: LEV Technical aspects: This EEG study was done with scalp electrodes positioned according to the 10-20  International system of electrode placement. Electrical  activity was acquired at a sampling rate of 500Hz  and reviewed with a high frequency filter of 70Hz  and a low frequency filter of 1Hz . EEG data were recorded continuously and digitally stored. Description: EEG showed continuous generalized 2-3 Hz delta slowing. Sharp transients were also noted in left frontal region. Hyperventilation and photic stimulation were not performed.  ABNORMALITY -Continuous slow, generalized IMPRESSION: This study is suggestive of severe diffuse encephalopathy, nonspecific etiology. No seizures or definite  epileptiform discharges were seen throughout the recording. Priyanka    Assessment/Plan: Patient with approximately 1 minute long seizure yesterday. EEG showed severe diffuse encephalopathy with nonspecific etiology. He is less responsive today and unable to follow commands. His EVD continues to be occluded with no output over the last 36 hours. Follow up CTH showed stable HCP without progression. Will plan to remove EVD this afternoon. Continue supportive care. Call with any questions.    LOS: 7 days     , DNP, NP-C 07/13/2020, 10:38 AM

## 2020-07-13 NOTE — Progress Notes (Signed)
SLP Cancellation Note  Patient Details Name: Charles Aguilar MRN: [id number] DOB: January 02, 1976   Cancelled treatment:       Reason Eval/Treat Not Completed: Fatigue/lethargy limiting ability to participate. Had seizure yesterday. Will f/u tomorrow if more alert to determine if pt can continue oral intake or needs cortrak   Artie Takayama, Riley Nearing 07/13/2020, 9:10 AM

## 2020-07-13 NOTE — Progress Notes (Signed)
NAME:  Charles Aguilar, MRN:  [id number], DOB:  1975/10/26, LOS: 7 ADMISSION DATE:  08/05/2020, CONSULTATION DATE:  2/24 REFERRING MD:  Dr. Iver Nestle, CHIEF COMPLAINT:  ICH   Brief History:  45 year old male presenting with headache and AMS found to have ICH and hypertensive emergency.   History of Present Illness:  45 year old male with PMH as below, which is significant for alcoholism. He reportedly drinks vodka continuously. Family does not have an estimate on actual volume. He was in his usual state of health until 2/24 when he arrived to pick his brother up from work while complaining of a headache and dizziness. His brother drove home and the patient slept. Upon arriving home, the patient began to vomit and fell out of the vehicle striking the right side of his face. It was at this point his brother called EMS who transported the patient to [facility] ED. CT scan was done in the ED and demonstrated R ICH with IVF and ventricular dilation. Neurosurgery and neurology consulted. The patient was started on clevidipine for blood pressure management and was scheduled for CT angiogram of the head. PCCM was asked to admit due to poor mentation and concern for poor airway protection.    Past Medical History:   has a past medical history of Epilepsy (HCC).   Significant Hospital Events:  2/24 admit for ICH  Consults:  Neurology Neurosurgery  Procedures:  2/25 IVC drain >> 2/24 ETT >> 2/26  Significant Diagnostic Tests:  CT head 2/24 > 2.0 cm right basal ganglia/thalamic hemorrhage with intraventricular Extension. Upon further review there is new mild to moderate panventricular dilatation compared to prior exam. MRI Brain 2/25 >>> 1. Motion degraded study. 2. Right basal ganglia/thalamic intraparenchymal hemorrhage decompressing into the ventricular system with large amount of blood within the supratentorial and infratentorial ventricular system, stable. 3. Left frontal approach  ventricular drain with the tip within the frontal horn of the left lateral ventricle with stable size of the ventricular system.  Micro Data:  none  Antimicrobials:  Vancomycin for surgical prophylaxis  Interim History / Subjective:  Remains somewhat somnolent this morning.  Unable to follow commands  Objective   Blood pressure (!) 150/104, pulse 96, temperature 99 F (37.2 C), temperature source Oral, resp. rate 15, height 6\' 2"  (1.88 m), weight 70.1 kg, SpO2 100 %.        Intake/Output Summary (Last 24 hours) at 07/13/2020 0624 Last data filed at 07/13/2020 0600 Gross per 24 hour  Intake 1296.91 ml  Output 2000 ml  Net -703.09 ml   Filed Weights   07/07/20 0731 07/08/20 0400  Weight: 69.9 kg 70.1 kg   Physical Exam Const: Somnolent, unable to follow commands HEENT: EVD with sero-sanguinous drainage, set at 5cm H20 Resp: CTA BL, no wheezes, crackles, rhonchi CV: RRR, no murmurs, gallop, rub Abd: Bowel sounds present, nondistended, nontender to palpation Ext: No lower extremity edema, skin is warm to touch Neuro: Withdrawal to painful stimuli, somnolent, unable to follow command   Resolved Hospital Problem list    Respiratory failure  Assessment & Plan:   #Intracranial hemorrhage of R basal ganglia/thalamus #Encephalopathy and agitation due to ICH and possible alcohol withdrawal #Seizure As noted, he had a seizure episode yesterday afternoon and was started on Keppra.  Stat bedside EEG did not show any seizures but did show diffuse encephalopathy.  This morning, he is minimally responsive however withdraws to painful stimuli such as sternal rub.  Part of his  sedation this morning could be secondary to his higher dose of Keppra. -Decrease Keppra to 500 mg twice daily -Appreciate neurosurgery recommendations.  I believe the plan is to remove EVD today -Long-term EEG ongoing   #Hypertensive Emergency  Goal SBP <160 -Continue amlodipine 10 mg daily -Increase Coreg to  25 mg twice daily -Hydralazine prn -Labetalol prn  -Discontinue hydrochlorothiazide due to electrolyte abnormalities   #Alcohol abuse with tremors Resolved and will encourage patient to abstain from alcohol whenever he is stable for discharge   #Electrolyte abnormalities -Hypophosphatemia: Resolved -Hypotonic hyponatremia: Serum sodium this a.m. 121 <<126. Discontinue HCTZ and F/u Ur Na, Osm -Hypokalemia: IV repletion ongoing   #Transaminitis- likely secondary to alcohol    Daily Goals Checklist  Pain/Anxiety/Delirium protocol (if indicated): Precedex Neuro vitals: every 4 hours AED's: none VAP protocol (if indicated): not intubated.  Respiratory support goals: maintain sat>90% Blood pressure target: SBP <160 acutely, <140 long-term.  DVT prophylaxis: heparin tid Nutrition Status: Progress diet GI prophylaxis: not indicated  Fluid status goals: Allow autoregulation Urinary catheter: Can transition to condom catheter Central lines: none Glucose control: euglycemic on no treatment. Mobility/therapy needs: mobilize as mental status allows. Currently needs restraints to prevent self harm.  Antibiotic de-escalation: EVD prophylaxis only Home medication reconciliation: none Daily labs: CBC, BMP Code Status: Full  Family Communication: Mother updated on 3/2. Disposition: ICU  Labs   CBC: Recent Labs  Lab 07/10/2020 1900 06/28/2020 1946 07/07/20 0858 07/08/20 0431 07/09/20 0425 07/10/20 0242 07/11/20 0251  WBC 5.9  --  8.1 11.2* 16.4* 11.7* 8.4  NEUTROABS 3.4  --   --   --   --   --   --   HGB 12.7*   < > 11.3* 11.3* 12.4* 11.4* 12.1*  HCT 38.2*   < > 32.4* 32.7* 37.2* 34.1* 33.6*  MCV 104.9*  --  102.2* 102.2* 104.8* 102.4* 97.4  PLT 212  --  242 200 184 175 217   < > = values in this interval not displayed.    Basic Metabolic Panel: Recent Labs  Lab 07/09/2020 1900 06/20/2020 1946 07/07/20 1611 07/08/20 0431 07/08/20 2114 07/09/20 0425 07/10/20 0242  07/11/20 0251 07/11/20 1005 07/12/20 0201 07/13/20 0458  NA 142   < >  --  140  --  143 134* 129* 127* 126* 121*  K 3.6   < >  --  3.5  --  3.2* 3.4* 3.3* 4.0 3.3* 3.3*  CL 102   < >  --  106  --  105 103 94* 94* 90* 87*  CO2 24   < >  --  23  --  22 20* 23 21* 24 20*  GLUCOSE 90   < >  --  144*  --  98 148* 112* 127* 111* 117*  BUN 6   < >  --  9  --  7 7 8 11 10 13   CREATININE 0.70   < >  --  0.63  --  0.66 0.50* 0.59* 0.62 0.64 0.62  CALCIUM 8.9   < >  --  8.7*  --  8.8* 8.8* 9.6 9.6 10.1 10.0  MG 1.3*  --  2.1 1.9  --  1.9 2.1  --   --   --   --   PHOS  --   --  2.0* 1.1* 3.1  --   --   --   --   --   --    < > = values in  this interval not displayed.   GFR: Estimated Creatinine Clearance: 116.8 mL/min (by C-G formula based on SCr of 0.62 mg/dL). Recent Labs  Lab 07/08/20 0431 07/09/20 0425 07/10/20 0242 07/11/20 0251  WBC 11.2* 16.4* 11.7* 8.4    Liver Function Tests: Recent Labs  Lab 06/15/2020 1900  AST 129*  ALT 83*  ALKPHOS 114  BILITOT 0.6  PROT 8.0  ALBUMIN 3.6   No results for input(s): LIPASE, AMYLASE in the last 168 hours. Recent Labs  Lab 07/08/2020 1933  AMMONIA 59*    ABG    Component Value Date/Time   PHART 7.522 (H) 07/07/2020 0406   PCO2ART 25.2 (L) 07/07/2020 0406   PO2ART 98 07/07/2020 0406   HCO3 20.7 07/07/2020 0406   TCO2 21 (L) 07/07/2020 0406   ACIDBASEDEF 1.0 07/07/2020 0406   O2SAT 98.0 07/07/2020 0406     Coagulation Profile: Recent Labs  Lab 07/07/20 0140  INR 1.1    Cardiac Enzymes: No results for input(s): CKTOTAL, CKMB, CKMBINDEX, TROPONINI in the last 168 hours.  HbA1C: Hgb A1c MFr Bld  Date/Time Value Ref Range Status  07/07/2020 06:16 AM 5.7 (H) 4.8 - 5.6 % Final    Comment:    (NOTE) Pre diabetes:          5.7%-6.4%  Diabetes:              >6.4%  Glycemic control for   <7.0% adults with diabetes

## 2020-07-14 LAB — CBC
HCT: 36.2 % — ABNORMAL LOW (ref 39.0–52.0)
Hemoglobin: 13.3 g/dL (ref 13.0–17.0)
MCH: 35.1 pg — ABNORMAL HIGH (ref 26.0–34.0)
MCHC: 36.7 g/dL — ABNORMAL HIGH (ref 30.0–36.0)
MCV: 95.5 fL (ref 80.0–100.0)
Platelets: 279 10*3/uL (ref 150–400)
RBC: 3.79 MIL/uL — ABNORMAL LOW (ref 4.22–5.81)
RDW: 11.9 % (ref 11.5–15.5)
WBC: 11.8 10*3/uL — ABNORMAL HIGH (ref 4.0–10.5)
nRBC: 0 % (ref 0.0–0.2)

## 2020-07-14 LAB — BASIC METABOLIC PANEL
Anion gap: 13 (ref 5–15)
Anion gap: 14 (ref 5–15)
Anion gap: 13 (ref 5–15)
BUN: 14 mg/dL (ref 6–20)
BUN: 14 mg/dL (ref 6–20)
BUN: 14 mg/dL (ref 6–20)
CO2: 18 mmol/L — ABNORMAL LOW (ref 22–32)
CO2: 17 mmol/L — ABNORMAL LOW (ref 22–32)
CO2: 18 mmol/L — ABNORMAL LOW (ref 22–32)
Calcium: 9.2 mg/dL (ref 8.9–10.3)
Calcium: 9.3 mg/dL (ref 8.9–10.3)
Calcium: 9 mg/dL (ref 8.9–10.3)
Chloride: 94 mmol/L — ABNORMAL LOW (ref 98–111)
Chloride: 97 mmol/L — ABNORMAL LOW (ref 98–111)
Chloride: 98 mmol/L (ref 98–111)
Creatinine, Ser: 0.6 mg/dL — ABNORMAL LOW (ref 0.61–1.24)
Creatinine, Ser: 0.58 mg/dL — ABNORMAL LOW (ref 0.61–1.24)
Creatinine, Ser: 0.57 mg/dL — ABNORMAL LOW (ref 0.61–1.24)
GFR, Estimated: >60 mL/min (ref >60)
GFR, Estimated: >60 mL/min (ref >60)
GFR, Estimated: >60 mL/min (ref >60)
Glucose, Bld: 104 mg/dL — ABNORMAL HIGH (ref 70–99)
Glucose, Bld: 103 mg/dL — ABNORMAL HIGH (ref 70–99)
Glucose, Bld: 127 mg/dL — ABNORMAL HIGH (ref 70–99)
Potassium: 3.6 mmol/L (ref 3.5–5.1)
Potassium: 3.9 mmol/L (ref 3.5–5.1)
Potassium: 3.3 mmol/L — ABNORMAL LOW (ref 3.5–5.1)
Sodium: 125 mmol/L — ABNORMAL LOW (ref 135–145)
Sodium: 128 mmol/L — ABNORMAL LOW (ref 135–145)
Sodium: 129 mmol/L — ABNORMAL LOW (ref 135–145)

## 2020-07-14 LAB — SODIUM
Sodium: 129 mmol/L — ABNORMAL LOW (ref 135–145)
Sodium: 128 mmol/L — ABNORMAL LOW (ref 135–145)
Sodium: 128 mmol/L — ABNORMAL LOW (ref 135–145)
Sodium: 127 mmol/L — ABNORMAL LOW (ref 135–145)

## 2020-07-14 LAB — MAGNESIUM: Magnesium: 2 mg/dL (ref 1.7–2.4)

## 2020-07-14 LAB — OSMOLALITY: Osmolality: 273 mOsm/kg — ABNORMAL LOW (ref 275–295)

## 2020-07-14 MED ORDER — CARVEDILOL 25 MG PO TABS
37.5000 mg | ORAL_TABLET | Freq: Two times a day (BID) | ORAL | Status: DC
  Administered 2020-07-14 – 2020-07-19 (×11): 37.5 mg via ORAL
  Filled 2020-07-14 (×10): qty 3
  Filled 2020-07-14: qty 1
  Filled 2020-07-14: qty 3

## 2020-07-14 MED ORDER — LEVETIRACETAM 500 MG PO TABS
500.0000 mg | ORAL_TABLET | Freq: Two times a day (BID) | ORAL | Status: DC
  Administered 2020-07-15 – 2020-07-19 (×10): 500 mg via ORAL
  Filled 2020-07-14 (×10): qty 1

## 2020-07-14 MED ORDER — LOSARTAN POTASSIUM 50 MG PO TABS
50.0000 mg | ORAL_TABLET | Freq: Every day | ORAL | Status: DC
  Administered 2020-07-14 – 2020-07-16 (×3): 50 mg
  Filled 2020-07-14 (×3): qty 1

## 2020-07-14 MED ORDER — POTASSIUM CHLORIDE 10 MEQ/100ML IV SOLN
10.0000 meq | INTRAVENOUS | Status: AC
  Administered 2020-07-14 (×4): 10 meq via INTRAVENOUS
  Filled 2020-07-14 (×4): qty 100

## 2020-07-14 MED ORDER — FENTANYL CITRATE (PF) 100 MCG/2ML IJ SOLN
25.0000 ug | Freq: Once | INTRAMUSCULAR | Status: AC
  Administered 2020-07-14: 25 ug via INTRAVENOUS
  Filled 2020-07-14: qty 2

## 2020-07-14 MED ORDER — DEXTROSE 5 % IV BOLUS: 250.0000 mL | Freq: Once | INTRAVENOUS | Status: DC

## 2020-07-14 MED ORDER — DEXTROSE 5 % IV BOLUS
250.0000 mL | Freq: Once | INTRAVENOUS | Status: AC
  Administered 2020-07-14: 250 mL via INTRAVENOUS

## 2020-07-14 MED ORDER — DEXTROSE 5 % IV SOLN: INTRAVENOUS | Status: DC

## 2020-07-14 NOTE — Progress Notes (Addendum)
STROKE TEAM PROGRESS NOTE   INTERVAL HISTORY Yesterday he was stuporous and had multiple electrolyte abnormalities. CCM stopped his HCTZ and placed him on Hypertonic saline. Due to continued lethargy his oral medications were changed to IV. His Keppra dose was decreased to 500 mg BID. Overnight EEG started on 3/2 and concluding on 3/3 showed severe diffuse encephalopathy, nonspecific etiology, no seizures or definite epileptiform discharges were seen. No acute events overnight.  His mother is at bedside. Today he is more awake. He is able to follow some commands but still is drowsy so does not follow all. He is able to say that he has no pain today. Discussed that since Neurosurgery removed his EVD today it is possible he could leave the ICU tomorrow as long as he continues to do well. Discussed keeping him on the EEG through tonight to ensure no further seizures happen.  CCM and Neurosurgery are following. Will consult Hospitalist Service to begin following tomorrow morning.  Vitals:   07/14/20 0300 07/14/20 0400 07/14/20 0500 07/14/20 0600  BP: (!) 161/105 133/90 (!) 144/103 (!) 149/99  Pulse: 99 (!) 101 97 93  Resp: 14 14 15 13   Temp:  98.2 F (36.8 C)    TempSrc:  Oral    SpO2: 100% 98% 100% 100%  Weight:      Height:       CBC:  Recent Labs  Lab 07/13/20 1806 07/14/20 0035  WBC 10.4 11.8*  HGB 13.6 13.3  HCT 38.1* 36.2*  MCV 97.2 95.5  PLT 264 279   Basic Metabolic Panel:  Recent Labs  Lab 07/08/20 2114 07/09/20 0425 07/10/20 0242 07/11/20 0251 07/13/20 0639 07/13/20 1637 07/13/20 1806 07/13/20 1931 07/14/20 0035  NA  --    < > 134*   < >  --  121*   < > 123* 128*  125*  K  --    < > 3.4*   < >  --  3.7  --   --  3.9  3.6  CL  --    < > 103   < >  --  89*  --   --  97*  94*  CO2  --    < > 20*   < >  --  18*  --   --  17*  18*  GLUCOSE  --    < > 148*   < >  --  107*  --   --  103*  104*  BUN  --    < > 7   < >  --  16  --   --  14  14  CREATININE  --    <  > 0.50*   < >  --  0.64  --   --  0.58*  0.60*  CALCIUM  --    < > 8.8*   < >  --  9.6  --   --  9.3  9.2  MG  --    < > 2.1  --  1.6*  --   --   --   --   PHOS 3.1  --   --   --  4.4  --   --   --   --    < > = values in this interval not displayed.   Lipid Panel: No results for input(s): CHOL, TRIG, HDL, CHOLHDL, VLDL, LDLCALC in the last 168 hours. HgbA1c: No results for input(s): HGBA1C in the last 168  hours. Urine Drug Screen: No results for input(s): LABOPIA, COCAINSCRNUR, LABBENZ, AMPHETMU, THCU, LABBARB in the last 168 hours.  Alcohol Level No results for input(s): ETH in the last 168 hours.  IMAGING past 24 hours Overnight EEG with video  Result Date: 07/13/2020 Charlsie Quest, MD     07/13/2020 11:26 AM Patient Name: Charles Aguilar MRN: [id number] Epilepsy Attending: Charlsie Quest Referring Physician/Provider: Dr Lynnell Catalan Duration: 07/12/2020 1615 to 07/13/2020 0027, [id number]  Patient history: 45yo M with right ICH. EEG to evaluate for seizure  Level of alertness: Awake  AEDs during EEG study: LEV  Technical aspects: This EEG study was done with scalp electrodes positioned according to the 10-20 International system of electrode placement. Electrical activity was acquired at a sampling rate of 500Hz  and reviewed with a high frequency filter of 70Hz  and a low frequency filter of 1Hz . EEG data were recorded continuously and digitally stored.  Description: EEG showed continuous generalized 2-3 Hz delta slowing. Sharp transients were also noted in left frontal region. Hyperventilation and photic stimulation were not performed.   ABNORMALITY -Continuous slow, generalized  IMPRESSION: This study is suggestive of severe diffuse encephalopathy, nonspecific etiology. No seizures or definite  epileptiform discharges were seen throughout the recording.  Priyanka    PHYSICAL EXAM Blood pressure (!) 162/129, pulse (!) 101, temperature 98.2 F (36.8 C), temperature source  Oral, resp. rate 16, height 6\' 2"  (1.88 m), weight 70.1 kg, SpO2 100 %.  General: Drowsy but arousable, well developed American male, not in apparent distress  Lungs: Symmetrical chest rise, no labored breathing  Cardio: Regular rate and Rhythm  Abdomen: soft, non distended  Neuro: Still not able to follow all commands.  But does arouse and opens eyes and follows simple midline and few one-step commands.  Able to move all 4 extremities against gravity left side more than right.  But able to move all limbs against gravity when asked and can keep them up.  ASSESSMENT/PLAN  Charles Aguilar is a 45 y.o. male with history of of heavy alcohol, abuse, tobacco abuse (3 to 40 cigarettes daily), TIA presented with Headache, dizziness, vomiting and progressive decreased level of consciousness and a fall. Ct Head showed 2.0 cm right basal ganglia/thalamic hemorrhage with intraventricular extension concerning for hydrocephalus - s/p EVD placement.CTA Head and Neck showed no large medium vessel occlusion or stenosis. No increase in size of the right basal ganglia hemorrhage. Slightly more intraventricular blood particularly in Rt lateral Ventricle than was seen previously. BP wascontrolled by Claviprex drip and labetalol PRN. HbA1c 5.7, LDL 79, Echo showedEF 55-60%. PTrecommends CIR. Etiology of ICH and IVH is likely due to hypertension with risk factors of alcohol and smoking. MRI showed stable hematoma, blood pressure goal will relax to less than 160. CIWA protocol, avoid DT. On B1/FA/multivitamin. Mentation continues to wax and wane.     Yesterday had multiple electrolyte abnormalities, CCM stopped HCTZ which they felt was contributing and began repletion. Today electrolytes are improved, will continue to monitor. EVD was removed by Neurosurgery, site looks clean, dry, and intact. Will continue with overnight EEG to ensure no further seizure activity happens. He is more alert today  but still drowsy. If he continues to do well could move out of ICU tomorrow.   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 "  There is new mild to moderate pan ventriculardilatation compared to prior exam."  CTA head &neck - No large or medium vessel occlusion or stenosis. No increase in size of the right basal ganglia hemorrhage. Slight enlargement in ventricular size when compared topreviousstudy.  MRI stable hematoma and IVH, stable size of ventricular system  2D EchoEF 55-60%  LDL79  HgbA1c5.7  VTE prophylaxis - SCD's now (due to ICH)  No antithromboticprior to admission, now on No antithromboticdue to ICH  Therapy recommendations:CIR  Disposition:Not medically ready for CIR at this time  Hydrocephalous  Off3% Normal Saline  Neurosurgery is following  IVC placed on 2/25  IVC obstructed on 2/27, 2/28, and 3/1  EVD was removed today.  Hypertension  Home meds: None  Off claviprex  BP goal systolic <160 mmHg  Long-term BP goal normotensive  Labetalol PRN  Amlodipine 10 mg daily  Carvedilol to 25 mg BID  Start Losartan 50 mg daily  HCTZ stopped  Hyperlipidemia  Home meds: None.  LDL 79, goal < 70  Consider Atorvastatin 40 mg on Discharge  Smoker  Uses 3-40 cigarettes /day. Cigar use.  May offer Nicotine patch when stabalize.  Smoking cessation education will be provided.   Alcohol Use  Heavy daily alcohol/liquor use.  Continue Thiamine/MVI/ Folic acid  CIWA protocol.  Electrolyte Abnormalities  Discussed with CCM this monring  K is 3.9, Na is 128, Cl is 97  CCM will stop HCTZ and increase Carvedilol  Repeat BMP tomorrow AM  Other Stroke Risk Factors  Hx of TIA  Hx of seizure - likely alcohol related??   Hospital day # 8  Continue close neurological  observation and will continue long-term EEG monitoring for 1 more day.  His electrolytes seem to be improving and ventricle has been discontinued.  Hopefully he will wake up more in the next few days to participate with therapy and go to rehab.  Consult medical hospitalist team assume care once he leaves ICU Long discussion with patient and mother and answered questions.  Greater than 50% time during this 35-minute visit were spent on counseling and coordination of care and discussion with care team Delia Heady, MD To contact Stroke Continuity provider, please refer to [email or web address]. After hours, contact General Neurology

## 2020-07-14 NOTE — Progress Notes (Signed)
  Speech Language Pathology Treatment: Dysphagia Patient Details Name: Charles Aguilar MRN: [id number] DOB: December 09, 1975 Today's Date: 07/14/2020 Time: 4970-2637 SLP Time Calculation (min) (ACUTE ONLY): 18 min  Assessment / Plan / Recommendation Clinical Impression  SWALLOWING Today pt's swallow function was clinically reassessed following seizure on 3/2.  Pt was awake, alert, and mother was present for session. Pt tolerated all consistencies trialed with no clinical s/s of aspiration.  Pt exhibited prompt but prolonged mastication with regular solid and there was significant L oral residue which was cleared with liquid wash.  There was adequate oral clearance of puree.  With pills crushed in puree there was mild-moderate diffuse oral residue which was cleared with liquid wash.  Pt eagerly accepted PO trials today. Recommend puree diet with thin liquids with continuing use of swallow precautions (minimize distractions, small bites, slow rate, upright positioning, alternate liquids with puree, 1:1 feeding assistance)  COGNITION Although, not directly addressed today, pt did demonstrate some competencies related to PO trials.   Pt did not speak much during session until the very end where he gave a few single word responses.  Pt was able to look to both sides of his body today.  Pt attempted to self feed, but did require hand over assistance to bring cup to mouth.  Pt accepted PO trials without encouragement. Pt also attempted to place cup aside, but no table was near by and SLP took cup from pt with verbal and tactile cuing.    HPI HPI: Patient is a 45 y.o. male with PMH: alcoholism (reportedly drinks vodka continuously, 1-2 pints per day) who presented to ED on 2/24 after c/o headache and dizziness while picking up brother from work. His brother then drove patient home and when patient exited car he began to vomit and fell out of car striking right side of his face. His brother then called EMS. CT  scan completed in ED and patient demonstrated Right basal ganglia/thalamic intraparenchymal hemorrhage with intraventricular extension. Pt intubated after arrival (2/24) and extubated 2/26 with placement of L frontal IVC on 2/25. Pt with seizure 3/2.      SLP Plan  Continue with current plan of care       Recommendations  Diet recommendations: Dysphagia 1 (puree);Thin liquid Liquids provided via: Cup;Straw Medication Administration: Crushed with puree Supervision: Staff to assist with self feeding;Trained caregiver to feed patient Compensations: Slow rate;Small sips/bites;Follow solids with liquid Postural Changes and/or Swallow Maneuvers: Seated upright 90 degrees                Oral Care Recommendations: Oral care BID Follow up Recommendations: Inpatient Rehab SLP Visit Diagnosis: Dysphagia, oropharyngeal phase (R13.12) Plan: Continue with current plan of care       GO                Kerrie Pleasure, MA, CCC-SLP Acute Rehabilitation Services Office: [phone]  07/14/2020, 11:47 AM

## 2020-07-14 NOTE — Progress Notes (Signed)
Subjective: Patient reports that he is more responsive today and is following commands. He is in NAD. The patient's mother is at bedside.   Objective: Vital signs in last 24 hours: Temp:  [97.8 F (36.6 C)-98.9 F (37.2 C)] 98.2 F (36.8 C) (03/04 0400) Pulse Rate:  [80-103] 93 (03/04 0600) Resp:  [13-25] 13 (03/04 0600) BP: (133-173)/(87-117) 149/99 (03/04 0600) SpO2:  [98 %-100 %] 100 % (03/04 0600)  Intake/Output from previous day: 03/03 0701 - 03/04 0700 In: 1666.9 [I.V.:590.4; IV Piggyback:1076.6] Out: 1500 [Urine:1500] Intake/Output this shift: No intake/output data recorded.  Physical Exam: Patient responds to voice. He is following commands, but unable to answer orientation questions. MAEW. PERRLA.   Lab Results: Recent Labs    07/13/20 1806 07/14/20 0035  WBC 10.4 11.8*  HGB 13.6 13.3  HCT 38.1* 36.2*  PLT 264 279   BMET Recent Labs    07/13/20 1637 07/13/20 1806 07/13/20 1931 07/14/20 0035  NA 121*   < > 123* 128*  125*  K 3.7  --   --  3.9  3.6  CL 89*  --   --  97*  94*  CO2 18*  --   --  17*  18*  GLUCOSE 107*  --   --  103*  104*  BUN 16  --   --  14  14  CREATININE 0.64  --   --  0.58*  0.60*  CALCIUM 9.6  --   --  9.3  9.2   < > = values in this interval not displayed.    Studies/Results: CT HEAD WO CONTRAST  Result Date: 07/12/2020 CLINICAL DATA:  Stroke follow-up assess for hydrocephalus. EXAM: CT HEAD WITHOUT CONTRAST TECHNIQUE: Contiguous axial images were obtained from the base of the skull through the vertex without intravenous contrast. COMPARISON:  Head CT July 12, 2020 at 1017 hours FINDINGS: Brain: Similar volume of intraparenchymal hemorrhage at the right caudothalamic groove with no significant change in the volume of a intraventricular blood products. Unchanged position of the left frontal approach intraventricular drain which terminates in the left lateral ventricle with similar configuration of the ventricular system.  Additionally unchanged is the hemorrhage about the catheter with rim of edema. No significant mass effect, midline shift and no evidence of herniation. Vascular: No hyperdense vessel or unexpected calcification. Skull: Negative Sinuses/Orbits: Mucosal thickening of the maxillary sinuses and ethmoid air cells. Other: None IMPRESSION: 1. No significant change in the intraparenchymal hemorrhage at the right caudothalamic groove with similar volume of intraventricular blood products. 2. Unchanged position of the left frontal approach intraventricular drain with similar configuration/volume of the ventricular system. Electronically Signed   By: Maudry Mayhew MD   On: 07/12/2020 16:00   CT HEAD WO CONTRAST  Result Date: 07/12/2020 CLINICAL DATA:  Stroke follow-up EXAM: CT HEAD WITHOUT CONTRAST TECHNIQUE: Contiguous axial images were obtained from the base of the skull through the vertex without intravenous contrast. COMPARISON:  Brain MRI 07/07/2020 FINDINGS: Brain: Significantly decreased intraventricular hemorrhage. Left frontal external ventricular drain which traverses the left lateral ventricle with tip near the left caudate head. Hemorrhage around the catheter is new, with rim of edema. Decreased hemorrhage at the right caudothalamic groove. No convincing infarct. Vascular: Negative Skull: Negative Sinuses/Orbits: Negative IMPRESSION: 1. Decreased right basal ganglia and intraventricular hemorrhage. 2. Left frontal EVD with interval hemorrhage around the catheter. Ventricular volume is diminished from prior. Electronically Signed   By: Marnee Spring M.D.   On: 07/12/2020 10:47  EEG adult  Result Date: 07/12/2020 Charlsie Quest, MD     07/12/2020  5:21 PM Patient Name: Charles Aguilar MRN: [id number] Epilepsy Attending: Charlsie Quest Referring Physician/Provider: Dr Lynnell Catalan Date: 07/12/2020 Duration: 22.56 mins Patient history: 45yo M with right ICH. EEG to evaluate for seizure Level of  alertness: Awake AEDs during EEG study: LEV Technical aspects: This EEG study was done with scalp electrodes positioned according to the 10-20 International system of electrode placement. Electrical activity was acquired at a sampling rate of 500Hz  and reviewed with a high frequency filter of 70Hz  and a low frequency filter of 1Hz . EEG data were recorded continuously and digitally stored. Description: EEG showed continuous generalized 2-3 Hz delta slowing. Sharp transients were also noted in left frontal region. Hyperventilation and photic stimulation were not performed.  ABNORMALITY -Continuous slow, generalized IMPRESSION: This study is suggestive of severe diffuse encephalopathy, nonspecific etiology. No seizures or definite  epileptiform discharges were seen throughout the recording. Priyanka   Overnight EEG with video  Result Date: 07/13/2020 , MD     07/13/2020 11:26 AM Patient Name: Jamill Wetmore MRN: Charlsie Quest Epilepsy Attending: 09/12/2020 Referring Physician/Provider: Dr Wilnette Kales Duration: 07/12/2020 1615 to 07/13/2020 Lynnell Catalan, 09/11/2020- 1115  Patient history: 45yo M with right ICH. EEG to evaluate for seizure  Level of alertness: Awake  AEDs during EEG study: LEV  Technical aspects: This EEG study was done with scalp electrodes positioned according to the 10-20 International system of electrode placement. Electrical activity was acquired at a sampling rate of 500Hz  and reviewed with a high frequency filter of 70Hz  and a low frequency filter of 1Hz . EEG data were recorded continuously and digitally stored.  Description: EEG showed continuous generalized 2-3 Hz delta slowing. Sharp transients were also noted in left frontal region. Hyperventilation and photic stimulation were not performed.   ABNORMALITY -Continuous slow, generalized  IMPRESSION: This study is suggestive of severe diffuse encephalopathy, nonspecific etiology. No seizures or definite  epileptiform  discharges were seen throughout the recording.  Priyanka [id number]    Assessment/Plan: Patient with improved neurological status as compared to yesterday. He is following simple commands and is more responsive. His EVD has been removed and the site is CDI. EEG continuing to show severe diffuse encephalopathy of nonspecific etiology. Continue supportive care. No new neurosurgical recommendations at this time. Call with any questions.    LOS: 8 days     [id number], DNP, NP-C 07/14/2020, 8:26 AM

## 2020-07-14 NOTE — Progress Notes (Signed)
Nutrition Follow-up  DOCUMENTATION CODES:   Not applicable  INTERVENTION:   Ensure Enlive po TID, each supplement provides 350 kcal and 20 grams of protein  Encourage PO intake at meals    NUTRITION DIAGNOSIS:   Inadequate oral intake related to lethargy/confusion as evidenced by meal completion < 50%. Ongoing.   GOAL:   Patient will meet greater than or equal to 90% of their needs Progressing.   MONITOR:   Diet advancement,Supplement acceptance,PO intake  REASON FOR ASSESSMENT:   Consult,Ventilator Enteral/tube feeding initiation and management  ASSESSMENT:   Pt with PMH of ETOH abuse admitted with ICH.    2/25 s/p EVD 2/26 extubated  2/28 diet advanced to full liquids 3/4 diet advanced to dysphagia 1 with thin liquids  Meal Completion: 50-90%   Pt discussed during ICU rounds and with RN.  Plan for CIR admission once bed available.  EVD removed and pt more alert following simple commands.   Medications reviewed and include: folic acid, MVI, senokot-s Precedex  Labs reviewed: Na 129, K+ 3.3    Diet Order:   Diet Order            DIET - DYS 1 Room service appropriate? Yes; Fluid consistency: Thin  Diet effective now                 EDUCATION NEEDS:   No education needs have been identified at this time  Skin:  Skin Assessment: Reviewed RN Assessment  Last BM:  2/26  Height:   Ht Readings from Last 1 Encounters:  07/07/20 6\' 2"  (1.88 m)    Weight:   Wt Readings from Last 1 Encounters:  07/08/20 70.1 kg    Ideal Body Weight:     BMI:  Body mass index is 19.84 kg/m.  Estimated Nutritional Needs:   Kcal:  2000-2200  Protein:  105-115 grams  Fluid:  >2 L/day  07/10/20., RD, LDN, CNSC See AMiON for contact information

## 2020-07-14 NOTE — Progress Notes (Signed)
LTM maint complete - no skin breakdown under: FP1,FP2, C3,CZ

## 2020-07-14 NOTE — Progress Notes (Signed)
LTM maint complete - no skin breakdown under:  Fp1 Fp2 f8 serviced C3

## 2020-07-14 NOTE — Progress Notes (Signed)
NAME:  Charles Aguilar, MRN:  [id number], DOB:  [birth year], LOS: 8 ADMISSION DATE:  07/07/20, CONSULTATION DATE:  2/24 REFERRING MD:  Dr. Iver Nestle, CHIEF COMPLAINT:  ICH   Brief History:  45 year old male presenting with headache and AMS found to have ICH and hypertensive emergency.   History of Present Illness:  45 year old male with PMH as below, which is significant for alcoholism. He reportedly drinks vodka continuously. Family does not have an estimate on actual volume. He was in his usual state of health until 2/24 when he arrived to pick his brother up from work while complaining of a headache and dizziness. His brother drove home and the patient slept. Upon arriving home, the patient began to vomit and fell out of the vehicle striking the right side of his face. It was at this point his brother called EMS who transported the patient to [facility] ED. CT scan was done in the ED and demonstrated R ICH with IVF and ventricular dilation. Neurosurgery and neurology consulted. The patient was started on clevidipine for blood pressure management and was scheduled for CT angiogram of the head. PCCM was asked to admit due to poor mentation and concern for poor airway protection.    Past Medical History:   has a past medical history of Epilepsy (HCC).   Significant Hospital Events:  2/24 admit for ICH  Consults:  Neurology Neurosurgery  Procedures:  2/25 IVC drain >> 3/4 2/24 ETT >> 2/26  Significant Diagnostic Tests:  CT head 2/24 > 2.0 cm right basal ganglia/thalamic hemorrhage with intraventricular Extension. Upon further review there is new mild to moderate panventricular dilatation compared to prior exam. MRI Brain 2/25 >>> 1. Motion degraded study. 2. Right basal ganglia/thalamic intraparenchymal hemorrhage decompressing into the ventricular system with large amount of blood within the supratentorial and infratentorial ventricular system, stable. 3. Left frontal approach  ventricular drain with the tip within the frontal horn of the left lateral ventricle with stable size of the ventricular system.  Micro Data:  none  Antimicrobials:  Vancomycin for surgical prophylaxis  Interim History / Subjective:  He was examined at bedside with his mother present.  Mr. [name] is much more alert this morning.  He is able to move his extremities and track his eyes.  Objective   Blood pressure (!) 149/99, pulse 93, temperature 98.2 F (36.8 C), temperature source Oral, resp. rate 13, height 6\' 2"  (1.88 m), weight 70.1 kg, SpO2 100 %.        Intake/Output Summary (Last 24 hours) at 07/14/2020 0628 Last data filed at 07/14/2020 0600 Gross per 24 hour  Intake 1666.92 ml  Output 1500 ml  Net 166.92 ml   Filed Weights   07/07/20 0731 07/08/20 0400  Weight: 69.9 kg 70.1 kg   Physical Exam Const: Awake, able to follow some commands.  Alert and oriented x0 HEENT: EDV removed Resp: CTA BL, no wheezes, crackles, rhonchi CV: RRR, no murmurs, gallop, rub Abd: Bowel sounds present, nondistended, nontender to palpation Ext: No lower extremity edema, skin is warm to touch Neuro: Moves all four extremities with command, however appears weak.  He is much more alert this morning.  Tries as much as he can to respond to exam questioning   Resolved Hospital Problem list    Respiratory failure  Assessment & Plan:  #Intracranial hemorrhage of R basal ganglia/thalamus #Acute metabolic Encephalopathy 2/2 hyponatremia #Seizure 2/2 acute hypotonic hyponatremia  LTM did not show any evidence of epileptiform  discharges, sNa has improved this morning to 128 after he was started on hypertonic saline.  His mentation has also improved as he is now able to follow commands, track eyes, move extremities -Discontinue hypertonic saline -Continue Keppra to 500 mg twice daily -Appreciate neurosurgery recommendations.  -Follow up repeat BMP and daily BMP -Appreciate neurology  recommendations -SLP, PT, OT   #Hypertension Goal SBP <160 -Continue amlodipine 10 mg daily -Increase Coreg from 25 mg to 37.5 mg twice daily -Increase losartan to 50 mg daily -Hydralazine prn -Labetalol prn    #Electrolyte abnormalities -Hypotonic hyponatremia: sNa 128 <<125<<123<<122 -Hypokalemia: Resolved   #Alcohol abuse with tremors Resolved and will encourage patient to abstain from alcohol whenever he is stable for discharge   #Transaminitis- likely secondary to alcohol    Daily Goals Checklist  Pain/Anxiety/Delirium protocol (if indicated): Precedex Neuro vitals: every 4 hours AED's: none VAP protocol (if indicated): not intubated.  Respiratory support goals: maintain sat>90% Blood pressure target: SBP <160 acutely, <140 long-term.  DVT prophylaxis: heparin tid Nutrition Status: Progress diet GI prophylaxis: not indicated  Fluid status goals: Allow autoregulation Urinary catheter: Can transition to condom catheter Central lines: none Glucose control: euglycemic on no treatment. Mobility/therapy needs: mobilize as mental status allows. Currently needs restraints to prevent self harm.  Antibiotic de-escalation: EVD prophylaxis only Home medication reconciliation: none Daily labs: CBC, BMP Code Status: Full  Family Communication: Mother updated on 3/2. Disposition: ICU  Labs   CBC: Recent Labs  Lab 07/09/20 0425 07/10/20 0242 07/11/20 0251 07/13/20 1806 07/14/20 0035  WBC 16.4* 11.7* 8.4 10.4 11.8*  HGB 12.4* 11.4* 12.1* 13.6 13.3  HCT 37.2* 34.1* 33.6* 38.1* 36.2*  MCV 104.8* 102.4* 97.4 97.2 95.5  PLT 184 175 217 264 279    Basic Metabolic Panel: Recent Labs  Lab 07/07/20 1611 07/08/20 0431 07/08/20 2114 07/09/20 0425 07/10/20 0242 07/11/20 0251 07/11/20 1005 07/12/20 0201 07/13/20 0458 07/13/20 0639 07/13/20 1637 07/13/20 1806 07/13/20 1931 07/14/20 0035  NA  --  140  --  143 134*   < > 127* 126* 121*  --  121* 122* 123* 128*   125*  K  --  3.5  --  3.2* 3.4*   < > 4.0 3.3* 3.3*  --  3.7  --   --  3.9  3.6  CL  --  106  --  105 103   < > 94* 90* 87*  --  89*  --   --  97*  94*  CO2  --  23  --  22 20*   < > 21* 24 20*  --  18*  --   --  17*  18*  GLUCOSE  --  144*  --  98 148*   < > 127* 111* 117*  --  107*  --   --  103*  104*  BUN  --  9  --  7 7   < > 11 10 13   --  16  --   --  14  14  CREATININE  --  0.63  --  0.66 0.50*   < > 0.62 0.64 0.62  --  0.64  --   --  0.58*  0.60*  CALCIUM  --  8.7*  --  8.8* 8.8*   < > 9.6 10.1 10.0  --  9.6  --   --  9.3  9.2  MG 2.1 1.9  --  1.9 2.1  --   --   --   --  1.6*  --   --   --   --   PHOS 2.0* 1.1* 3.1  --   --   --   --   --   --  4.4  --   --   --   --    < > = values in this interval not displayed.   GFR: Estimated Creatinine Clearance: 116.8 mL/min (A) (by C-G formula based on SCr of 0.58 mg/dL (L)). Recent Labs  Lab 07/10/20 0242 07/11/20 0251 07/13/20 1806 07/14/20 0035  WBC 11.7* 8.4 10.4 11.8*    Liver Function Tests: No results for input(s): AST, ALT, ALKPHOS, BILITOT, PROT, ALBUMIN in the last 168 hours. No results for input(s): LIPASE, AMYLASE in the last 168 hours. No results for input(s): AMMONIA in the last 168 hours.  ABG    Component Value Date/Time   PHART 7.522 (H) 07/07/2020 0406   PCO2ART 25.2 (L) 07/07/2020 0406   PO2ART 98 07/07/2020 0406   HCO3 20.7 07/07/2020 0406   TCO2 21 (L) 07/07/2020 0406   ACIDBASEDEF 1.0 07/07/2020 0406   O2SAT 98.0 07/07/2020 0406     Coagulation Profile: No results for input(s): INR, PROTIME in the last 168 hours.  Cardiac Enzymes: No results for input(s): CKTOTAL, CKMB, CKMBINDEX, TROPONINI in the last 168 hours.  HbA1C: Hgb A1c MFr Bld  Date/Time Value Ref Range Status  07/07/2020 06:16 AM 5.7 (H) 4.8 - 5.6 % Final    Comment:    (NOTE) Pre diabetes:          5.7%-6.4%  Diabetes:              >6.4%  Glycemic control for   <7.0% adults with diabetes

## 2020-07-14 NOTE — Progress Notes (Signed)
LTM maint complete -serviced C4 Fp1 Fp2 F3 F7 T7 A1

## 2020-07-14 NOTE — Progress Notes (Signed)
Physical Therapy Treatment Patient Details Name: Charles Aguilar MRN: [id number] DOB: 12-May-1976 Today's Date: 07/14/2020    History of Present Illness The pt is a 45 yo male presenting from home with headache, dizziness, and progressive altered mentation after multiple syncopal episodes. 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 Pt intubated after arrival (2/24) and extubated 2/26 with placement of L frontal IVC on 2/25. Seizure 3/2. Severe diffuse encephalopathy suggested with EEG 3/4.    PT Comments    Pt had seizure 3/2 and has since been very lethargic but has begun to become more responsive, thus attempted PT session today. Pt with R side neglect, tending to maintain cervical rotation to L throughout. Pt needing TAx2 for all bed mobility and maxAx2 for lateral scooting to the R today. Facilitated midline orientation in sitting EOB via simple multi-modal cues, but poor carryover even after encouraging positioning of pt in R lateral lean onto R elbow to decrease L lateral lean and even after pt placed with bil elbows to knees sitting EOB. Will continue to follow acutely. Current recommendations remain appropriate.    Follow Up Recommendations  CIR     Equipment Recommendations  Other (comment) (defer to post acute)    Recommendations for Other Services       Precautions / Restrictions Precautions Precautions: Fall Precaution Comments: seizure; continuous EEG; monitor vitals Restrictions Weight Bearing Restrictions: No    Mobility  Bed Mobility Overal bed mobility: Needs Assistance Bed Mobility: Supine to Sit;Sit to Supine     Supine to sit: +2 for safety/equipment;Total assist;+2 for physical assistance;HOB elevated Sit to supine: Total assist;+2 for physical assistance;+2 for safety/equipment   General bed mobility comments: Cued pt to bring legs towards EOB and reach for bed rail to sit up but little to no initiation by pt, thus  TAx2. TAx2 to return to bed.    Transfers Overall transfer level: Needs assistance Equipment used: None Transfers: Lateral/Scoot Transfers          Lateral/Scoot Transfers: Max assist;+2 physical assistance;+2 safety/equipment General transfer comment: Cued pt to scoot laterally to R with minimal initiation noted from pt once physical assistance provided to initiate transfer, maxAx2 for x3 scoots.  Ambulation/Gait             General Gait Details: Pt too lethargic to attempt safely this date.   Stairs             Wheelchair Mobility    Modified Rankin (Stroke Patients Only) Modified Rankin (Stroke Patients Only) Pre-Morbid Rankin Score: No symptoms Modified Rankin: Severe disability     Balance Overall balance assessment: Needs assistance Sitting-balance support: Feet supported;Bilateral upper extremity supported Sitting balance-Leahy Scale: Poor Sitting balance - Comments: Brief periods of min guard for up to ~10 sec intermittently but majority of time needing up to maxA to sit statically EOB with UE support due to posterior and L lateral lean. Positioned pt leaning onto R elbow several times and bil elbows > knees sitting EOB to facilitate midline alignment, poor carryover. Postural control: Posterior lean;Left lateral lean     Standing balance comment: deferred                            Cognition Arousal/Alertness: Lethargic Behavior During Therapy: Flat affect Overall Cognitive Status: Impaired/Different from baseline Area of Impairment: Attention;Memory;Following commands;Safety/judgement;Awareness;Problem solving  Current Attention Level: Divided Memory: Decreased recall of precautions;Decreased short-term memory Following Commands: Follows one step commands with increased time;Follows one step commands inconsistently Safety/Judgement: Decreased awareness of safety;Decreased awareness of deficits Awareness:  Intellectual Problem Solving: Slow processing;Decreased initiation;Requires verbal cues;Requires tactile cues;Difficulty sequencing General Comments: Eyes remained closed for most of session but able to open them on occasion. Flat affect and minimally verbal. When verbal he was very soft spoken. His mother reported that pt did not recognize her today. Pt with poor R side attention. Poor attention span and ability to follow simple cues today.      Exercises Other Exercises Other Exercises: Gentle stretch into R cervical lateral flexion and rotation    General Comments General comments (skin integrity, edema, etc.): HR up to 138 bpm sitting EOB      Pertinent Vitals/Pain Pain Assessment: Faces Faces Pain Scale: Hurts a little bit Pain Location: Generalizes with mobility Pain Descriptors / Indicators: Grimacing;Discomfort Pain Intervention(s): Limited activity within patient's tolerance;Monitored during session;Repositioned    Home Living                      Prior Function            PT Goals (current goals can now be found in the care plan section) Acute Rehab PT Goals Patient Stated Goal: none stated by pt, mother present and desires pt to improve PT Goal Formulation: With patient/family Time For Goal Achievement: 07/22/20 Potential to Achieve Goals: Good Progress towards PT goals: Not progressing toward goals - comment (recent seizure)    Frequency    Min 4X/week      PT Plan Current plan remains appropriate    Co-evaluation              AM-PAC PT "6 Clicks" Mobility   Outcome Measure  Help needed turning from your back to your side while in a flat bed without using bedrails?: Total Help needed moving from lying on your back to sitting on the side of a flat bed without using bedrails?: Total Help needed moving to and from a bed to a chair (including a wheelchair)?: Total Help needed standing up from a chair using your arms (e.g., wheelchair or  bedside chair)?: Total Help needed to walk in hospital room?: Total Help needed climbing 3-5 steps with a railing? : Total 6 Click Score: 6    End of Session   Activity Tolerance: Patient limited by lethargy;Patient limited by fatigue Patient left: in bed;with call bell/phone within reach;with bed alarm set;with restraints reapplied;with family/visitor present Nurse Communication: Mobility status;Other (comment) (HR) PT Visit Diagnosis: Unsteadiness on feet (R26.81);Other abnormalities of gait and mobility (R26.89);Muscle weakness (generalized) (M62.81);Difficulty in walking, not elsewhere classified (R26.2);Other symptoms and signs involving the nervous system (R29.898)     Time: 1341-1405 PT Time Calculation (min) (ACUTE ONLY): 24 min  Charges:  $Therapeutic Activity: 8-22 mins $Neuromuscular Re-education: 8-22 mins                     Raymond Gurney, PT, DPT Acute Rehabilitation Services  Pager: [phone] Office: [phone]    Jewel Baize 07/14/2020, 4:07 PM

## 2020-07-14 NOTE — Procedures (Addendum)
Patient Name:Charles Aguilar DJM:[id number] Epilepsy Attending:Kendarrius Tanzi Annabelle Harman Referring Physician/Provider:Dr Lynnell Catalan Duration:07/13/2020 1615 to 07/14/2020 1615  Patient history:44yo M with right ICH. EEG to evaluate for seizure  Level of alertness:Lethargic  AEDs during EEG study:LEV  Technical aspects: This EEG study was done with scalp electrodes positioned according to the 10-20 International system of electrode placement. Electrical activity was acquired at a sampling rate of 500Hz  and reviewed with a high frequency filter of 70Hz  and a low frequency filter of 1Hz . EEG data were recorded continuously and digitally stored.   Description:EEG showed continuous generalized 2-3Hz  delta slowing. Event button was pressed on 07/13/2020 at 2330 for unclear reasons. On video patient was noted to have subtle left arm twitching. Concomitant EEG before, during and after the event did not show any EEG change.  ABNORMALITY -Continuousslow, generalized  IMPRESSION: This study is suggestive of severe diffuse encephalopathy, nonspecific etiology.No definiteepileptiform discharges were seen throughout the recording.  Event button was pressed on 07/13/2198 2330 for unclear reasons without concomitant EEG change.  However focal motor seizures may not be seen on scalp EEG.  Clinical correlation is recommended.  Collier Monica [phone]

## 2020-07-14 NOTE — Progress Notes (Addendum)
OT Cancellation Note  Patient Details Name: Charles Aguilar MRN: [id number] DOB: 03/16/1976   Cancelled Treatment:    Reason Eval/Treat Not Completed: Fatigue/lethargy limiting ability to participate (Drowsy and fatigued. Recently finished PT session.) Will return as schedule allows.   Monai Hindes M Kalvyn Desa Jcion Buddenhagen MSOT, OTR/L Acute Rehab Pager: [phone] Office: [phone] 07/14/2020, 3:51 PM

## 2020-07-14 NOTE — Progress Notes (Signed)
Inpatient Rehab Admissions Coordinator:  Saw pt and mother at bedside. Informed them that there are no beds available in CIR today. Also informed that we will continue to follow pt's progress with therapies.   Wolfgang Phoenix, MS, CCC-SLP Admissions Coordinator [phone]

## 2020-07-15 ENCOUNTER — Inpatient Hospital Stay (HOSPITAL_COMMUNITY): Payer: Medicaid Other

## 2020-07-15 DIAGNOSIS — R569 Unspecified convulsions: Secondary | ICD-10-CM

## 2020-07-15 LAB — URINALYSIS, ROUTINE W REFLEX MICROSCOPIC
Bilirubin Urine: NEGATIVE
Glucose, UA: NEGATIVE mg/dL
Hgb urine dipstick: NEGATIVE
Ketones, ur: 5 mg/dL — AB
Nitrite: NEGATIVE
Protein, ur: NEGATIVE mg/dL
Specific Gravity, Urine: 1.019 (ref 1.005–1.030)
pH: 6 (ref 5.0–8.0)

## 2020-07-15 LAB — SODIUM
Sodium: 129 mmol/L — ABNORMAL LOW (ref 135–145)
Sodium: 128 mmol/L — ABNORMAL LOW (ref 135–145)

## 2020-07-15 LAB — LACTIC ACID, PLASMA
Lactic Acid, Venous: 1.7 mmol/L (ref 0.5–1.9)
Lactic Acid, Venous: 2.1 mmol/L (ref 0.5–1.9)

## 2020-07-15 LAB — CBC
HCT: 34 % — ABNORMAL LOW (ref 39.0–52.0)
Hemoglobin: 11.9 g/dL — ABNORMAL LOW (ref 13.0–17.0)
MCH: 34.4 pg — ABNORMAL HIGH (ref 26.0–34.0)
MCHC: 35 g/dL (ref 30.0–36.0)
MCV: 98.3 fL (ref 80.0–100.0)
Platelets: 292 10*3/uL (ref 150–400)
RBC: 3.46 MIL/uL — ABNORMAL LOW (ref 4.22–5.81)
RDW: 11.9 % (ref 11.5–15.5)
WBC: 10.5 10*3/uL (ref 4.0–10.5)
nRBC: 0 % (ref 0.0–0.2)

## 2020-07-15 LAB — BASIC METABOLIC PANEL
Anion gap: 9 (ref 5–15)
BUN: 14 mg/dL (ref 6–20)
CO2: 20 mmol/L — ABNORMAL LOW (ref 22–32)
Calcium: 9.2 mg/dL (ref 8.9–10.3)
Chloride: 98 mmol/L (ref 98–111)
Creatinine, Ser: 0.53 mg/dL — ABNORMAL LOW (ref 0.61–1.24)
GFR, Estimated: >60 mL/min (ref >60)
Glucose, Bld: 126 mg/dL — ABNORMAL HIGH (ref 70–99)
Potassium: 3.6 mmol/L (ref 3.5–5.1)
Sodium: 127 mmol/L — ABNORMAL LOW (ref 135–145)

## 2020-07-15 MED ORDER — ORAL CARE MOUTH RINSE
15.0000 mL | Freq: Two times a day (BID) | OROMUCOSAL | Status: DC
  Administered 2020-07-15 – 2020-07-19 (×9): 15 mL via OROMUCOSAL

## 2020-07-15 NOTE — Progress Notes (Signed)
PROGRESS NOTE    Charles Aguilar  FBP:[id number] DOB: Sep 20, 1975 DOA: 2020/07/27 PCP: Patient, No Pcp Per   Brief Narrative:   Charles Aguilar is a 45 y.o. male with history of of heavy alcohol, abuse, tobacco abuse (3 to 40 cigarettes daily), TIA presented with Headache, dizziness, vomiting and progressive decreased level of consciousness and a fall.  Ct Head showed 2.0 cm right basal ganglia/thalamic hemorrhage with intraventricular extension concerning for hydrocephalus - s/p EVD placement.CTA Head and Neck showed no large medium vessel occlusion or stenosis. No increase in size of the right basal ganglia hemorrhage. Slightly more intraventricular blood particularly in Rt lateral Ventricle than was seen previously. BP wascontrolled by Claviprex drip and labetalol PRN. HbA1c 5.7, LDL 79, Echo showedEF 55-60%. PTrecommends CIR. Etiology of ICH and IVH is likely due to hypertension with risk factors of alcohol and smoking. MRI showed stable hematoma, blood pressure goal will be less than 160. CIWA protocol, avoid DT. On B1/FA/multivitamin. Mentation continues to wax and wane.   Assessment & Plan:  Intracranial hemorrhage with IVH/hydrocephalus: Acute metabolic encephalopathy: -Suspect in the setting of hypertension, ongoing tobacco abuse and alcohol abuse -CT head shows 2.0 cm right basal ganglia/thalamic hemorrhage with IV extension. -CTA head and neck shows no large or medium vessel occlusion.  -MRI brain shows stable hematoma and IVH, stable size of ventricular system. -2D echo showed ejection fraction of 55 to 60%, LDL: 79, A1c: 5.7 -Status post IVC placement on 2/25-which was obstructed-neurosurgery infused 2 mg of TPA on 2/27. EVD removed. -PT/OT recommended CIR -Continue neurochecks -Neurology on board-appreciate help.  Neurosurgery has signed off.  Hypertension: Blood pressure elevated this morning -Goal BP systolic less than 160 -Continue Coreg and losartan and  amlodipine.  Continue as needed hydralazine and labetalol. HCTZ discontinued due to hyponatremia. Monitor blood pressure closely  Acute hypotonic hyponatremia: -SIADH? -Received hypertonic saline in the ICU. -Sodium level 127 this morning. Monitor closely.  Seizures: -Alcohol related? -Continue Keppra 500 mg twice daily -EEG shows moderate diffuse encephalopathy.  Nonspecific etiology, no definite epileptiform discharges seen throughout the recording. -On seizure precautions  Acute hypoxemic respiratory failure: -In the setting of poor mentation due to stroke.  Required intubation on 2/24.  Is post extubation. -Resolved.  Currently on room air.  Low-grade fever: -One episode of 100.4 fever reported yesterday afternoon.  No fever since then.  Chest x-ray negative for acute findings.  UA is pending.  Other vital signs within normal limits.  No leukocytosis.  Will hold off antibiotics at this time.  Alcohol abuse: With tremors -Continue with CIWA protocol. -Continue B12, thiamine and multivitamins -On seizure protocol  Tobacco abuse: Counseled about cessation  Hypokalemia: Resolved  Hypomagnesemia: Resolved  Physical deconditioning:-PT OT recommended CIR  DVT prophylaxis: SCD Code Status: Full code Family Communication: Patient's mom present at bedside.  Plan of care discussed with patient in length and he verbalized understanding and agreed with it. Disposition Plan: CIR  Consultants:   Neurology  Neurosurgery  PCCM  Procedures:   EEG  Intubation  Status post EVD placement  Antimicrobials:   None  Status is: Inpatient  Dispo: The patient is from: Home              Anticipated d/c is to: CIR              Patient currently is not medically stable to d/c.   Difficult to place patient No    Subjective: Patient seen and examined.  Mom at the  bedside.  He is alert and awake.  Able to recognize his mom.  Denies any new complaints.  Had low-grade fever of  100.4 yesterday afternoon.  No further episodes of seizures reported.  Objective: Vitals:   07/15/20 1000 07/15/20 1100 07/15/20 1200 07/15/20 1300  BP: (!) 128/94 (!) 129/92 (!) 142/118 (!) 144/101  Pulse: 76 70 72 93  Resp: (!) 23 12 15 13   Temp:      TempSrc:      SpO2: 100% 100% 100% 100%  Weight:      Height:        Intake/Output Summary (Last 24 hours) at 07/15/2020 1315 Last data filed at 07/15/2020 0335 Gross per 24 hour  Intake 458.62 ml  Output 1400 ml  Net -941.38 ml   Filed Weights   07/07/20 0731 07/08/20 0400  Weight: 69.9 kg 70.1 kg    Examination:  General exam: Appears calm and comfortable, appears chronically ill, weak Respiratory system: Clear to auscultation. Respiratory effort normal. Cardiovascular system: S1 & S2 heard, RRR. No JVD, murmurs, rubs, gallops or clicks. No pedal edema. Gastrointestinal system: Abdomen is nondistended, soft and nontender. No organomegaly or masses felt. Normal bowel sounds heard. Central nervous system: Alert and oriented x1.  Following commands very well. Extremities: Symmetric 5 x 5 power. Skin: No rashes, lesions or ulcers Psychiatry: Judgement and insight appear normal. Mood & affect appropriate.    Data Reviewed: I have personally reviewed following labs and imaging studies  CBC: Recent Labs  Lab 07/10/20 0242 07/11/20 0251 07/13/20 1806 07/14/20 0035 07/15/20 0025  WBC 11.7* 8.4 10.4 11.8* 10.5  HGB 11.4* 12.1* 13.6 13.3 11.9*  HCT 34.1* 33.6* 38.1* 36.2* 34.0*  MCV 102.4* 97.4 97.2 95.5 98.3  PLT 175 217 264 279 292   Basic Metabolic Panel: Recent Labs  Lab 07/08/20 2114 07/09/20 0425 07/09/20 0425 07/10/20 0242 07/11/20 0251 07/13/20 0458 07/13/20 0639 07/13/20 1637 07/13/20 1806 07/14/20 0035 07/14/20 1045 07/14/20 1616 07/14/20 1751 07/14/20 2003 07/14/20 2154 07/15/20 0025  NA  --  143   < > 134*   < > 121*  --  121*   < > 128*  125* 129* 129* 128* 128* 127* 127*  K  --  3.2*   < >  3.4*   < > 3.3*  --  3.7  --  3.9  3.6 3.3*  --   --   --   --  3.6  CL  --  105   < > 103   < > 87*  --  89*  --  97*  94* 98  --   --   --   --  98  CO2  --  22   < > 20*   < > 20*  --  18*  --  17*  18* 18*  --   --   --   --  20*  GLUCOSE  --  98   < > 148*   < > 117*  --  107*  --  103*  104* 127*  --   --   --   --  126*  BUN  --  7   < > 7   < > 13  --  16  --  14  14 14   --   --   --   --  14  CREATININE  --  0.66   < > 0.50*   < > 0.62  --  0.64  --  0.58*  0.60* 0.57*  --   --   --   --  0.53*  CALCIUM  --  8.8*   < > 8.8*   < > 10.0  --  9.6  --  9.3  9.2 9.0  --   --   --   --  9.2  MG  --  1.9  --  2.1  --   --  1.6*  --   --   --   --  2.0  --   --   --   --   PHOS 3.1  --   --   --   --   --  4.4  --   --   --   --   --   --   --   --   --    < > = values in this interval not displayed.   GFR: Estimated Creatinine Clearance: 116.8 mL/min (A) (by C-G formula based on SCr of 0.53 mg/dL (L)). Liver Function Tests: No results for input(s): AST, ALT, ALKPHOS, BILITOT, PROT, ALBUMIN in the last 168 hours. No results for input(s): LIPASE, AMYLASE in the last 168 hours. No results for input(s): AMMONIA in the last 168 hours. Coagulation Profile: No results for input(s): INR, PROTIME in the last 168 hours. Cardiac Enzymes: No results for input(s): CKTOTAL, CKMB, CKMBINDEX, TROPONINI in the last 168 hours. BNP (last 3 results) No results for input(s): PROBNP in the last 8760 hours. HbA1C: No results for input(s): HGBA1C in the last 72 hours. CBG: Recent Labs  Lab 07/08/20 1632  GLUCAP 139*   Lipid Profile: No results for input(s): CHOL, HDL, LDLCALC, TRIG, CHOLHDL, LDLDIRECT in the last 72 hours. Thyroid Function Tests: No results for input(s): TSH, T4TOTAL, FREET4, T3FREE, THYROIDAB in the last 72 hours. Anemia Panel: No results for input(s): VITAMINB12, FOLATE, FERRITIN, TIBC, IRON, RETICCTPCT in the last 72 hours. Sepsis Labs: No results for input(s): PROCALCITON,  LATICACIDVEN in the last 168 hours.  Recent Results (from the past 240 hour(s))  Resp Panel by RT-PCR (Flu A&B, Covid) Nasopharyngeal Swab     Status: None   Collection Time: 07/17/2020  9:05 PM   Specimen: Nasopharyngeal Swab; Nasopharyngeal(NP) swabs in vial transport medium  Result Value Ref Range Status   SARS Coronavirus 2 by RT PCR NEGATIVE NEGATIVE Final    Comment: (NOTE) SARS-CoV-2 target nucleic acids are NOT DETECTED.  The SARS-CoV-2 RNA is generally detectable in upper respiratory specimens during the acute phase of infection. The lowest concentration of SARS-CoV-2 viral copies this assay can detect is 138 copies/mL. A negative result does not preclude SARS-Cov-2 infection and should not be used as the sole basis for treatment or other patient management decisions. A negative result may occur with  improper specimen collection/handling, submission of specimen other than nasopharyngeal swab, presence of viral mutation(s) within the areas targeted by this assay, and inadequate number of viral copies(<138 copies/mL). A negative result must be combined with clinical observations, patient history, and epidemiological information. The expected result is Negative.  Fact Sheet for Patients:  BloggerCourse.com  Fact Sheet for Healthcare Providers:  SeriousBroker.it  This test is no t yet approved or cleared by the [place] FDA and  has been authorized for detection and/or diagnosis of SARS-CoV-2 by FDA under an Emergency Use Authorization (EUA). This EUA will remain  in effect (meaning this test can be used) for the duration of the COVID-19 declaration under Section 564(b)(1) of the Act,  21 U.S.C.section 360bbb-3(b)(1), unless the authorization is terminated  or revoked sooner.       Influenza A by PCR NEGATIVE NEGATIVE Final   Influenza B by PCR NEGATIVE NEGATIVE Final    Comment: (NOTE) The Xpert Xpress  SARS-CoV-2/FLU/RSV plus assay is intended as an aid in the diagnosis of influenza from Nasopharyngeal swab specimens and should not be used as a sole basis for treatment. Nasal washings and aspirates are unacceptable for Xpert Xpress SARS-CoV-2/FLU/RSV testing.  Fact Sheet for Patients: BloggerCourse.com  Fact Sheet for Healthcare Providers: SeriousBroker.it  This test is not yet approved or cleared by the [place] FDA and has been authorized for detection and/or diagnosis of SARS-CoV-2 by FDA under an Emergency Use Authorization (EUA). This EUA will remain in effect (meaning this test can be used) for the duration of the COVID-19 declaration under Section 564(b)(1) of the Act, 21 U.S.C. section 360bbb-3(b)(1), unless the authorization is terminated or revoked.  Performed at [facility], [address], [place], [place] [address]   MRSA PCR Screening     Status: None   Collection Time: 07/10/20  1:22 PM   Specimen: Nasal Mucosa; Nasopharyngeal  Result Value Ref Range Status   MRSA by PCR NEGATIVE NEGATIVE Final    Comment:        The GeneXpert MRSA Assay (FDA approved for NASAL specimens only), is one component of a comprehensive MRSA colonization surveillance program. It is not intended to diagnose MRSA infection nor to guide or monitor treatment for MRSA infections. Performed at [facility], [address], [place], [place] [address]       Radiology Studies: DG CHEST PORT 1 VIEW  Result Date: 07/15/2020 CLINICAL DATA:  45 year old male with fever and altered mental status. EXAM: PORTABLE CHEST 1 VIEW COMPARISON:  None. FINDINGS: The patient is rotated to the left. The heart size and mediastinal contours are within normal limits. Both lungs are clear. The visualized skeletal structures are unremarkable. IMPRESSION: No acute cardiopulmonary process. Electronically Signed   By: Marliss Coots MD   On:  07/15/2020 09:33    Scheduled Meds: . amLODipine  10 mg Oral Daily  . bethanechol  10 mg Oral TID  . carvedilol  37.5 mg Oral BID  . Chlorhexidine Gluconate Cloth  6 each Topical Daily  . feeding supplement  237 mL Oral TID BM  . folic acid  1 mg Oral Daily  . heparin injection (subcutaneous)  5,000 Units Subcutaneous Q8H  . levETIRAcetam  500 mg Oral BID  . losartan  50 mg Per Tube Daily  . mouth rinse  15 mL Mouth Rinse BID  . multivitamin with minerals  1 tablet Oral Daily  . senna-docusate  1 tablet Oral BID   Continuous Infusions: . sodium chloride    . dextrose       LOS: 9 days   Time spent: 40 minutes   Charles Kreiter Estill Cotta, MD [facility]  If 7PM-7AM, please contact night-coverage [email or web address] 07/15/2020, 1:15 PM

## 2020-07-15 NOTE — Progress Notes (Signed)
Critical Value 07/15/20 2219  Lactic Acid 2.1   MD notified: Paged Linton Flemings at [phone]. No call back received.   Action taken: No orders given.

## 2020-07-15 NOTE — Progress Notes (Addendum)
STROKE TEAM PROGRESS NOTE   INTERVAL HISTORY No acute events overnight.  His mother is at the bedside.  Today he reports that he has a little pain in his shoulders but otherwise is doing good. When asked his age he reports 45 (he is 40) and he continued to perseverate on stating his age. He recognized his mother and was able to follow commands. Once he is medically stable the plan continues to be discharge to CIR.  Hospitalists are taking over Primary.  Vitals:   07/15/20 0400 07/15/20 0500 07/15/20 0600 07/15/20 0700  BP: (!) 157/112 (!) 165/109 (!) 156/106 (!) 158/101  Pulse: 81 80 84 80  Resp: 12 13 15 12   Temp: 98.1 F (36.7 C)     TempSrc: Oral     SpO2: 100% 100% 100% 100%  Weight:      Height:       CBC:  Recent Labs  Lab 07/14/20 0035 07/15/20 0025  WBC 11.8* 10.5  HGB 13.3 11.9*  HCT 36.2* 34.0*  MCV 95.5 98.3  PLT 279 292   Basic Metabolic Panel:  Recent Labs  Lab 07/08/20 2114 07/09/20 0425 07/13/20 0639 07/13/20 1637 07/14/20 1045 07/14/20 1616 07/14/20 1751 07/14/20 2154 07/15/20 0025  NA  --    < >  --    < > 129* 129*   < > 127* 127*  K  --    < >  --    < > 3.3*  --   --   --  3.6  CL  --    < >  --    < > 98  --   --   --  98  CO2  --    < >  --    < > 18*  --   --   --  20*  GLUCOSE  --    < >  --    < > 127*  --   --   --  126*  BUN  --    < >  --    < > 14  --   --   --  14  CREATININE  --    < >  --    < > 0.57*  --   --   --  0.53*  CALCIUM  --    < >  --    < > 9.0  --   --   --  9.2  MG  --    < > 1.6*  --   --  2.0  --   --   --   PHOS 3.1  --  4.4  --   --   --   --   --   --    < > = values in this interval not displayed.   Lipid Panel: No results for input(s): CHOL, TRIG, HDL, CHOLHDL, VLDL, LDLCALC in the last 168 hours. HgbA1c: No results for input(s): HGBA1C in the last 168 hours. Urine Drug Screen: No results for input(s): LABOPIA, COCAINSCRNUR, LABBENZ, AMPHETMU, THCU, LABBARB in the last 168 hours.  Alcohol Level No results  for input(s): ETH in the last 168 hours.  IMAGING past 24 hours No results found.  PHYSICAL EXAM Blood pressure (!) 158/101, pulse 80, temperature 98.1 F (36.7 C), temperature source Oral, resp. rate 12, height 6\' 2"  (1.88 m), weight 70.1 kg, SpO2 100 %.  General: More awake and interactive today, African American male, no apparent  distress  Lungs: Symmetrical Chest Rise, no labored breathing  Cardio: Regular Rate and Rhythm  Abdomen: soft, non-tender  Neuro: Alert but not oriented to age, place or situation. Able to follow simple commands.  Cranial Nerves: II:  Visual fields grossly normal, pupils equal, round, reactive to light and accommodation III,IV, VI: ptosis not present, extra-ocular motions intact bilaterally V,VII: smile symmetric, facial light touch sensation normal bilaterally VIII: hearing normal bilaterally IX,X: uvula rises symmetrically XI: bilateral shoulder shrug XII: midline tongue extension without atrophy or fasciculations  Motor: Able to lift all limbs with no drift Tone and bulk:normal tone throughout; no atrophy noted Sensory: light touch intact throughout, bilaterally Gait: deferred      ASSESSMENT/PLAN Mr. Dmauri Rosenow is a 45 y.o. male with history of heavy alcohol, abuse, tobacco abuse (3 to 40 cigarettes daily), TIA presented with Headache, dizziness, vomiting and progressive decreased level of consciousness and a fall. Ct Head showed 2.0 cm right basal ganglia/thalamic hemorrhage with intraventricular extension concerning for hydrocephalus - s/p EVD placement.CTA Head and Neck showed no large medium vessel occlusion or stenosis. No increase in size of the right basal ganglia hemorrhage. Slightly more intraventricular blood particularly in Rt lateral Ventricle than was seen previously. BP wascontrolled by Claviprex drip and labetalol PRN. HbA1c 5.7, LDL 79, Echo showedEF 55-60%. PTrecommends CIR. Etiology of ICH and IVH is likely due to  hypertension with risk factors of alcohol and smoking. MRI showed stable hematoma, blood pressure goal will relax to less than 160. CIWA protocol, avoid DT. On B1/FA/multivitamin. Mentation continues to wax and wane.  He is much more alert today. He is still disoriented and perseverates on things. No new seizures have been noted. EEG reveals moderate diffuse encephalopathy but no epileptiform discharges seen. His electrolytes have improved and are stable except for Na which continues to be low but is stable (127) This is most likely due to his EtOH abuse. Hospitalists have assumed primary care. Most likely he will move out of ICU today. Plan is still to discharge to CIR when medically cleared.     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 There is new mild to moderate panventriculardilatation compared to prior exam.  CTA head &neck - No large or medium vessel occlusion or stenosis. No increase in size of the right basal ganglia hemorrhage. Slight enlargement in ventricular size when compared topreviousstudy.  MRI stable hematoma and IVH, stable size of ventricular system  2D EchoEF 55-60%  Serial CTs stable hematoma and hydro  LDL79  HgbA1c5.7  VTE prophylaxis - SCD's now (due to ICH)  No antithromboticprior to admission, now on No antithromboticdue to ICH  Therapy recommendations:CIR  Disposition:Not medically ready for CIR at this time  Hydrocephalous  Off3% Normal Saline  Neurosurgery is following  IVC placed on 2/25  IVC obstructed on 2/27,2/28, and 3/1  EVD was removed 3/4  Seizure  3/2 reported seizure in the setting of hyponatremia  3/2 EEG severe diffuse encephalopathy  3/3 LTM right thumb twitching and gaze deviation to right without concomitant EEG change  3/5 LTM moderate diffuse  encephalopathy  D/c LTM EEG  On keppra 1000->500mg  bid  Hypertension  Home meds: None  Off claviprex  BP goal systolic <160 mmHg  Long-term BP goal normotensive  Labetalol PRN  Amlodipine 10 mg daily  Carvedilolto 25->37.5mg  BID  Start  Losartan 50 mg daily  HCTZstopped  Hyperlipidemia  Home meds: None.  LDL 79, goal < 70  Consider Atorvastatin 20 mg on Discharge  Smoker  Uses 3-40 cigarettes /day. Cigar use.  May offer Nicotine patch when stabalize.  Smoking cessation education will be provided.   Alcohol Use  Heavy daily alcohol/liquor use.  Continue Thiamine/MVI/ Folic acid  CIWA protocol.  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   Hospital day # 9   ATTENDING NOTE: I reviewed above note and agree with the assessment and plan. Pt was seen and examined.   Pt mom and Dr. Jacqulyn Bath are at the bedside. Pt much more awake alert today, eyes open, orientated to place, people but not to time or age. Follows simple commands. Paucity of speech but able to repeat 3 word sentences. Still has right gaze preference, left gaze incomplete. Blinking to visual threat on the right, inconsistent on the left. Left facial mild droop. However, moving all extremities symmetrically. Sensation, coordination and gait not tested. BUE tremor much improved, now only minimal.   Appreciate hospitalist take over the case as primary. Pt neuro stable, EVD removed yesterday. Will d/c LTM EEG. OK to transfer out of ICU from neuro standpoint. PT/OT recommend CIR. Continue BP management, goal < 160 but long term goal normotensive. Hyponatremia treatment per hospitalist service. Seizure precautions.    For detailed assessment and plan, please refer to above as I have made changes wherever  appropriate.   Marvel Plan, MD PhD Stroke Neurology 07/15/2020 10:02 PM  I had long discussion with mom at bedside, updated pt current condition, treatment plan and potential prognosis, and answered all the questions. She expressed understanding and appreciation. I also discussed with Dr. Jacqulyn Bath. I spent  35 minutes in total face-to-face time with the patient, more than 50% of which was spent in counseling and coordination of care, reviewing test results, images and medication, and discussing the diagnosis, treatment plan and potential prognosis. This patient's care requiresreview of multiple databases, neurological assessment, discussion with family, other specialists and medical decision making of high complexity.    To contact Stroke Continuity provider, please refer to [email or web address]. After hours, contact General Neurology

## 2020-07-15 NOTE — Progress Notes (Signed)
eLink Physician-Brief Progress Note Patient Name: Charles Aguilar DOB: 02-15-1976 MRN: [id number]   Date of Service  07/15/2020  HPI/Events of Note  Patient had a dose of iv Keppra 500 mg earlier this evening, and oral Keppra 500 mg bid is scheduled by the pharmacist to start this evening, bedside RN is asking if it's okay to start oral Keppra tonight.  eICU Interventions  Bedside RN informed that it's okay to begin oral Keppra tonight.        Thomasene Lot Ogan 07/15/2020, 12:19 AM

## 2020-07-15 NOTE — Progress Notes (Signed)
Overall stable.  Ventriculostomy removed.  No new recommendations from our standpoint.

## 2020-07-15 NOTE — Procedures (Addendum)
Patient Name:Charles Aguilar ZOX:[id number] Epilepsy Attending:Kevion Fatheree Annabelle Harman Referring Physician/Provider:Dr Lynnell Catalan Duration:07/14/2020 8119 to 07/15/2020  1478  Patient history:44yo M with right ICH. EEG to evaluate for seizure  Level of alertness:awake, asleep  AEDs during EEG study:LEV  Technical aspects: This EEG study was done with scalp electrodes positioned according to the 10-20 International system of electrode placement. Electrical activity was acquired at a sampling rate of 500Hz  and reviewed with a high frequency filter of 70Hz  and a low frequency filter of 1Hz . EEG data were recorded continuously and digitally stored.   Description:No clear posterior dominant rhythm was seen. Sleep was characterized by sleep spindles 912-14hz ), maximal frontocentral region. EEG showed continuous generalized3-5Hz  theta-delta slowing admixed with 15-18hz  overriding beta activity.   ABNORMALITY -Continuousslow, generalized  IMPRESSION: This study is suggestive ofmoderate diffuse encephalopathy, nonspecific etiology.No definiteepileptiform discharges were seen throughout the recording.  [name] 

## 2020-07-15 NOTE — Progress Notes (Signed)
EEG being unhooked.

## 2020-07-16 LAB — LACTIC ACID, PLASMA
Lactic Acid, Venous: 1.1 mmol/L (ref 0.5–1.9)
Lactic Acid, Venous: 2.2 mmol/L (ref 0.5–1.9)
Lactic Acid, Venous: 1.6 mmol/L (ref 0.5–1.9)
Lactic Acid, Venous: 1.3 mmol/L (ref 0.5–1.9)
Lactic Acid, Venous: 1 mmol/L (ref 0.5–1.9)

## 2020-07-16 LAB — SODIUM: Sodium: 131 mmol/L — ABNORMAL LOW (ref 135–145)

## 2020-07-16 LAB — CBC
HCT: 33.3 % — ABNORMAL LOW (ref 39.0–52.0)
Hemoglobin: 12.2 g/dL — ABNORMAL LOW (ref 13.0–17.0)
MCH: 35.4 pg — ABNORMAL HIGH (ref 26.0–34.0)
MCHC: 36.6 g/dL — ABNORMAL HIGH (ref 30.0–36.0)
MCV: 96.5 fL (ref 80.0–100.0)
Platelets: 359 10*3/uL (ref 150–400)
RBC: 3.45 MIL/uL — ABNORMAL LOW (ref 4.22–5.81)
RDW: 11.9 % (ref 11.5–15.5)
WBC: 10.3 10*3/uL (ref 4.0–10.5)
nRBC: 0 % (ref 0.0–0.2)

## 2020-07-16 LAB — MAGNESIUM: Magnesium: 1.8 mg/dL (ref 1.7–2.4)

## 2020-07-16 LAB — COMPREHENSIVE METABOLIC PANEL
ALT: 43 U/L (ref 0–44)
AST: 34 U/L (ref 15–41)
Albumin: 3 g/dL — ABNORMAL LOW (ref 3.5–5.0)
Alkaline Phosphatase: 113 U/L (ref 38–126)
Anion gap: 10 (ref 5–15)
BUN: 14 mg/dL (ref 6–20)
CO2: 22 mmol/L (ref 22–32)
Calcium: 9.4 mg/dL (ref 8.9–10.3)
Chloride: 98 mmol/L (ref 98–111)
Creatinine, Ser: 0.52 mg/dL — ABNORMAL LOW (ref 0.61–1.24)
GFR, Estimated: >60 mL/min (ref >60)
Glucose, Bld: 123 mg/dL — ABNORMAL HIGH (ref 70–99)
Potassium: 3.5 mmol/L (ref 3.5–5.1)
Sodium: 130 mmol/L — ABNORMAL LOW (ref 135–145)
Total Bilirubin: 0.7 mg/dL (ref 0.3–1.2)
Total Protein: 8 g/dL (ref 6.5–8.1)

## 2020-07-16 LAB — AMMONIA: Ammonia: 40 umol/L — ABNORMAL HIGH (ref 9–35)

## 2020-07-16 LAB — PROCALCITONIN: Procalcitonin: 0.14 ng/mL

## 2020-07-16 MED ORDER — LIDOCAINE HCL URETHRAL/MUCOSAL 2 % EX GEL
1.0000 "application " | Freq: Once | CUTANEOUS | Status: DC
  Filled 2020-07-16: qty 11

## 2020-07-16 MED ORDER — BISACODYL 10 MG RE SUPP
10.0000 mg | Freq: Every day | RECTAL | Status: DC | PRN
  Administered 2020-07-16: 10 mg via RECTAL
  Filled 2020-07-16: qty 1

## 2020-07-16 NOTE — H&P (Signed)
Please see consult note from date of admission for H&P

## 2020-07-16 NOTE — Progress Notes (Signed)
STROKE TEAM PROGRESS NOTE   INTERVAL HISTORY No acute events overnight.  His mother is at the bedside.  Today he reports that he has a little pain in his shoulders but otherwise is doing good. When asked his age he reports 24 (he is 45) and he continued to perseverate on stating his age. He recognized his mother and was able to follow commands. Once he is medically stable the plan continues to be discharge to CIR.  Hospitalists are taking over Primary.  Vitals:   07/16/20 0500 07/16/20 0600 07/16/20 0700 07/16/20 0800  BP: (!) 159/112 (!) 150/99 (!) 154/107 (!) 165/110  Pulse: 78 73  77  Resp: 13 15 13 15   Temp:    (!) 97.5 F (36.4 C)  TempSrc:    Oral  SpO2: 100% 100%  100%  Weight:      Height:       CBC:  Recent Labs  Lab 07/15/20 0025 07/16/20 0215  WBC 10.5 10.3  HGB 11.9* 12.2*  HCT 34.0* 33.3*  MCV 98.3 96.5  PLT 292 359   Basic Metabolic Panel:  Recent Labs  Lab 07/13/20 0639 07/13/20 1637 07/14/20 1616 07/14/20 1751 07/15/20 0025 07/15/20 1412 07/15/20 1903 07/16/20 0215  NA  --    < > 129*   < > 127*   < > 128* 130*  131*  K  --    < >  --   --  3.6  --   --  3.5  CL  --    < >  --   --  98  --   --  98  CO2  --    < >  --   --  20*  --   --  22  GLUCOSE  --    < >  --   --  126*  --   --  123*  BUN  --    < >  --   --  14  --   --  14  CREATININE  --    < >  --   --  0.53*  --   --  0.52*  CALCIUM  --    < >  --   --  9.2  --   --  9.4  MG 1.6*  --  2.0  --   --   --   --  1.8  PHOS 4.4  --   --   --   --   --   --   --    < > = values in this interval not displayed.   Lipid Panel: No results for input(s): CHOL, TRIG, HDL, CHOLHDL, VLDL, LDLCALC in the last 168 hours. HgbA1c: No results for input(s): HGBA1C in the last 168 hours. Urine Drug Screen: No results for input(s): LABOPIA, COCAINSCRNUR, LABBENZ, AMPHETMU, THCU, LABBARB in the last 168 hours.  Alcohol Level No results for input(s): ETH in the last 168 hours.  IMAGING past 24 hours No  results found.  PHYSICAL EXAM Blood pressure (!) 165/110, pulse 77, temperature (!) 97.5 F (36.4 C), temperature source Oral, resp. rate 15, height 6\' 2"  (1.88 m), weight 70.1 kg, SpO2 100 %.  General: More awake and interactive today, African American male, no apparent distress  Lungs: Symmetrical Chest Rise, no labored breathing  Cardio: Regular Rate and Rhythm  Abdomen: soft, non-tender  Neuro: awake alert, still mildly lethargic, eyes open, orientated to place, people but not to time  or age (said 30 instead of 45). Follows simple commands. Paucity of speech but able to repeat 3 word sentences. Still has right gaze preference, however able to have complete left gaze on request. Blinking to visual threat on the right, inconsistent on the left. Left facial mild droop. However, moving all extremities symmetrically. Sensation, coordination and gait not tested. BUE tremor much improved, now only minimal.    ASSESSMENT/PLAN [name] is a 45 y.o. male with history of heavy alcohol, abuse, tobacco abuse (3 to 40 cigarettes daily), TIA presented with Headache, dizziness, vomiting and progressive decreased level of consciousness and a fall.   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 There is new mild to moderate panventriculardilatation compared to prior exam.  CTA head &neck - No large or medium vessel occlusion or stenosis. No increase in size of the right basal ganglia hemorrhage. Slight enlargement in ventricular size when compared topreviousstudy.  MRI stable hematoma and IVH, stable size of ventricular system  2D EchoEF 55-60%  Serial CTs stable hematoma and hydro  LDL79  HgbA1c5.7  VTE prophylaxis - heparin subq  No antithromboticprior to admission, now on No antithromboticdue to ICH  Therapy  recommendations:CIR  Disposition:pending  Hydrocephalous  Off3% Normal Saline  Neurosurgery on board  IVC placed on 2/25  IVC obstructed on 2/27,2/28, and 3/1  EVD was removed 3/4  Neuro stable   Seizure  3/2 reported seizure in the setting of hyponatremia  3/2 EEG severe diffuse encephalopathy  3/3 LTM right thumb twitching and gaze deviation to right without concomitant EEG change  3/5 LTM moderate diffuse encephalopathy  D/c LTM EEG 07/15/20  On keppra 1000 (drowsy) ->500mg  bid  Fever   Tamx 100.6 yesterday  CXR neg  UA WBC 11-20  Urine culture pending  Dr. Jacqulyn Bath is on board - appreciate help  Hypertension  Home meds: None  Off claviprex  BP goal systolic <160 mmHg  Long-term BP goal normotensive  Labetalol PRN  Amlodipine 10 mg daily  Carvedilolto 25->37.5mg  BID  Start Losartan 50 mg daily  HCTZstopped  Hyperlipidemia  Home meds: None.  LDL 79, goal < 70  May consider Atorvastatin 20 mg on Discharge  Smoker  Uses 3-40 cigarettes /day. Cigar use.  May offer Nicotine patch when stabalize.  Smoking cessation education will be provided.   Alcohol Use  Heavy daily alcohol/liquor use.  Continue Thiamine/MVI/ Folic acid  CIWA protocol.  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   Hospital day # 10  Neurology will sign off. Please call with questions. Pt will follow up with stroke clinic NP at [facility] in about 4 weeks. Thanks for the consult.   Marvel Plan, MD PhD Stroke Neurology 07/16/2020 10:20 AM     To contact Stroke Continuity provider, please refer to [email or web address]. After hours, contact General Neurology

## 2020-07-16 NOTE — Progress Notes (Signed)
PROGRESS NOTE    Charles Aguilar  GHW:[id number] DOB: 1976-03-10 DOA: 07/05/2020 PCP: Patient, No Pcp Per   Brief Narrative:   Charles Aguilar is a 45 y.o. male with history of of heavy alcohol, abuse, tobacco abuse (3 to 40 cigarettes daily), TIA presented with Headache, dizziness, vomiting and progressive decreased level of consciousness and a fall.  Ct Head showed 2.0 cm right basal ganglia/thalamic hemorrhage with intraventricular extension concerning for hydrocephalus - s/p EVD placement.CTA Head and Neck showed no large medium vessel occlusion or stenosis. No increase in size of the right basal ganglia hemorrhage. Slightly more intraventricular blood particularly in Rt lateral Ventricle than was seen previously. BP wascontrolled by Claviprex drip and labetalol PRN. HbA1c 5.7, LDL 79, Echo showedEF 55-60%. PTrecommends CIR. Etiology of ICH and IVH is likely due to hypertension with risk factors of alcohol and smoking. MRI showed stable hematoma, blood pressure goal will be less than 160. CIWA protocol, avoid DT. On B1/FA/multivitamin. Mentation continues to wax and wane.   Assessment & Plan:  Intracranial hemorrhage with IVH/hydrocephalus: Acute metabolic encephalopathy: -Suspect in the setting of hypertension, ongoing tobacco abuse and alcohol abuse -CT head shows 2.0 cm right basal ganglia/thalamic hemorrhage with IV extension. -CTA head and neck shows no large or medium vessel occlusion.  -MRI brain shows stable hematoma and IVH, stable size of ventricular system. -2D echo showed ejection fraction of 55 to 60%, LDL: 79, A1c: 5.7 -Status post IVC placement on 2/25-which was obstructed-neurosurgery infused 2 mg of TPA on 2/27. EVD removed on 3/4. -PT/OT recommended CIR -Continue neurochecks, check ammonia level -Neurology on board-appreciate help.  Neurosurgery has signed off.  Hypertension:  -Goal BP systolic less than 160 -Continue Coreg and losartan and  amlodipine.  Continue as needed hydralazine and labetalol. HCTZ discontinued due to hyponatremia. Monitor blood pressure closely  Acute hypotonic hyponatremia: -SIADH? -Received hypertonic saline in the ICU. -Sodium improved from one 27-1 30 this morning.  Seizures: -Alcohol related versus electrolyte abnormalities? -Continue Keppra 500 mg twice daily -EEG shows moderate diffuse encephalopathy.  Nonspecific etiology, no definite epileptiform discharges seen throughout the recording. -No further seizure episodes -Continue with seizure precautions  Low-grade fever: - suspect in the setting of intracranial hemorrhage.  He is not hypoxic not tachycardic. -Work-up for infections including-Chest x-ray negative for acute findings.  UA shows small leukocytes, rare bacteria-likely contamination?Marland Kitchen  No leukocytosis.  Blood culture, urine culture: Pending.  Lactic acid trended up from 1.1-2.2. -Check procalcitonin level.  We will hold off antibiotics at this time.  Acute hypoxemic respiratory failure: -In the setting of poor mentation due to stroke.  Required intubation on 2/24.  Is post extubation. -Resolved.  Currently on room air.  Alcohol abuse: With tremors -Continue with CIWA protocol. -Continue B12, thiamine and multivitamins -On seizure protocol  Tobacco abuse: Counseled about cessation  Hypokalemia: Resolved  Hypomagnesemia: Resolved  Chronic constipation: -Continue Senokot.  He had a large bowel movement today after about a week.  Physical deconditioning:-PT OT recommended CIR  DVT prophylaxis: SCD Code Status: Full code Family Communication: Patient's mom present at bedside.  Plan of care discussed with patient in length and he verbalized understanding and agreed with it. Disposition Plan: CIR  Consultants:   Neurology  Neurosurgery  PCCM  Procedures:   EEG  Intubation  Status post EVD placement  Antimicrobials:   None  Status is: Inpatient  Dispo: The  patient is from: Home  Anticipated d/c is to: CIR              Patient currently is not medically stable to d/c.   Difficult to place patient No    Subjective: Patient seen and examined.  Mom at the bedside.  He is alert and awake however confused-he was not able to recognize his mom and tells me the year is [id number] & thinks that [place] president is Pepco HoldingsBarack Obama.  Had low-grade fever yesterday.  No further seizure-like episodes.  No shortness of breath, diarrhea, vomiting.  Objective: Vitals:   07/16/20 0800 07/16/20 1000 07/16/20 1100 07/16/20 1200  BP: (!) 165/110 106/79 123/88 (!) 150/106  Pulse: 77 77 81 79  Resp: 15 16 16 15   Temp: (!) 97.5 F (36.4 C)   (!) 97.4 F (36.3 C)  TempSrc: Oral   Oral  SpO2: 100% 99% 93% 98%  Weight:      Height:        Intake/Output Summary (Last 24 hours) at 07/16/2020 1353 Last data filed at 07/16/2020 1200 Gross per 24 hour  Intake 480 ml  Output 1675 ml  Net -1195 ml   Filed Weights   07/07/20 0731 07/08/20 0400  Weight: 69.9 kg 70.1 kg    Examination:  General exam: Appears calm and comfortable, appears chronically ill, weak Respiratory system: Clear to auscultation. Respiratory effort normal. Cardiovascular system: S1 & S2 heard, RRR. No JVD, murmurs, rubs, gallops or clicks. No pedal edema. Gastrointestinal system: Abdomen is nondistended, soft and nontender. No organomegaly or masses felt. Normal bowel sounds heard. Central nervous system: Alert, not oriented to time place or person, following commands but appears  confused.   Extremities: Symmetric 5 x 5 power. Skin: No rashes, lesions or ulcers Psychiatry: Judgement and insight appear normal. Mood & affect appropriate.    Data Reviewed: I have personally reviewed following labs and imaging studies  CBC: Recent Labs  Lab 07/11/20 0251 07/13/20 1806 07/14/20 0035 07/15/20 0025 07/16/20 0215  WBC 8.4 10.4 11.8* 10.5 10.3  HGB 12.1* 13.6 13.3 11.9* 12.2*  HCT 33.6*  38.1* 36.2* 34.0* 33.3*  MCV 97.4 97.2 95.5 98.3 96.5  PLT 217 264 279 292 359   Basic Metabolic Panel: Recent Labs  Lab 07/10/20 0242 07/11/20 0251 07/13/20 0639 07/13/20 1637 07/13/20 1806 07/14/20 0035 07/14/20 1045 07/14/20 1616 07/14/20 1751 07/14/20 2154 07/15/20 0025 07/15/20 1412 07/15/20 1903 07/16/20 0215  NA 134*   < >  --  121*   < > 128*  125* 129* 129*   < > 127* 127* 129* 128* 130*  131*  K 3.4*   < >  --  3.7  --  3.9  3.6 3.3*  --   --   --  3.6  --   --  3.5  CL 103   < >  --  89*  --  97*  94* 98  --   --   --  98  --   --  98  CO2 20*   < >  --  18*  --  17*  18* 18*  --   --   --  20*  --   --  22  GLUCOSE 148*   < >  --  107*  --  103*  104* 127*  --   --   --  126*  --   --  123*  BUN 7   < >  --  16  --  14  14 14  --   --   --  14  --   --  14  CREATININE 0.50*   < >  --  0.64  --  0.58*  0.60* 0.57*  --   --   --  0.53*  --   --  0.52*  CALCIUM 8.8*   < >  --  9.6  --  9.3  9.2 9.0  --   --   --  9.2  --   --  9.4  MG 2.1  --  1.6*  --   --   --   --  2.0  --   --   --   --   --  1.8  PHOS  --   --  4.4  --   --   --   --   --   --   --   --   --   --   --    < > = values in this interval not displayed.   GFR: Estimated Creatinine Clearance: 116.8 mL/min (A) (by C-G formula based on SCr of 0.52 mg/dL (L)). Liver Function Tests: Recent Labs  Lab 07/16/20 0215  AST 34  ALT 43  ALKPHOS 113  BILITOT 0.7  PROT 8.0  ALBUMIN 3.0*   No results for input(s): LIPASE, AMYLASE in the last 168 hours. No results for input(s): AMMONIA in the last 168 hours. Coagulation Profile: No results for input(s): INR, PROTIME in the last 168 hours. Cardiac Enzymes: No results for input(s): CKTOTAL, CKMB, CKMBINDEX, TROPONINI in the last 168 hours. BNP (last 3 results) No results for input(s): PROBNP in the last 8760 hours. HbA1C: No results for input(s): HGBA1C in the last 72 hours. CBG: No results for input(s): GLUCAP in the last 168 hours. Lipid  Profile: No results for input(s): CHOL, HDL, LDLCALC, TRIG, CHOLHDL, LDLDIRECT in the last 72 hours. Thyroid Function Tests: No results for input(s): TSH, T4TOTAL, FREET4, T3FREE, THYROIDAB in the last 72 hours. Anemia Panel: No results for input(s): VITAMINB12, FOLATE, FERRITIN, TIBC, IRON, RETICCTPCT in the last 72 hours. Sepsis Labs: Recent Labs  Lab 07/15/20 1856 07/15/20 2114 07/16/20 0748 07/16/20 0947  LATICACIDVEN 1.7 2.1* 1.1 2.2*    Recent Results (from the past 240 hour(s))  Resp Panel by RT-PCR (Flu A&B, Covid) Nasopharyngeal Swab     Status: None   Collection Time: 07-12-2020  9:05 PM   Specimen: Nasopharyngeal Swab; Nasopharyngeal(NP) swabs in vial transport medium  Result Value Ref Range Status   SARS Coronavirus 2 by RT PCR NEGATIVE NEGATIVE Final    Comment: (NOTE) SARS-CoV-2 target nucleic acids are NOT DETECTED.  The SARS-CoV-2 RNA is generally detectable in upper respiratory specimens during the acute phase of infection. The lowest concentration of SARS-CoV-2 viral copies this assay can detect is 138 copies/mL. A negative result does not preclude SARS-Cov-2 infection and should not be used as the sole basis for treatment or other patient management decisions. A negative result may occur with  improper specimen collection/handling, submission of specimen other than nasopharyngeal swab, presence of viral mutation(s) within the areas targeted by this assay, and inadequate number of viral copies(<138 copies/mL). A negative result must be combined with clinical observations, patient history, and epidemiological information. The expected result is Negative.  Fact Sheet for Patients:  BloggerCourse.com  Fact Sheet for Healthcare Providers:  SeriousBroker.it  This test is no t yet approved or cleared by the [place] FDA and  has been  authorized for detection and/or diagnosis of SARS-CoV-2 by FDA under an  Emergency Use Authorization (EUA). This EUA will remain  in effect (meaning this test can be used) for the duration of the COVID-19 declaration under Section 564(b)(1) of the Act, 21 U.S.C.section 360bbb-3(b)(1), unless the authorization is terminated  or revoked sooner.       Influenza A by PCR NEGATIVE NEGATIVE Final   Influenza B by PCR NEGATIVE NEGATIVE Final    Comment: (NOTE) The Xpert Xpress SARS-CoV-2/FLU/RSV plus assay is intended as an aid in the diagnosis of influenza from Nasopharyngeal swab specimens and should not be used as a sole basis for treatment. Nasal washings and aspirates are unacceptable for Xpert Xpress SARS-CoV-2/FLU/RSV testing.  Fact Sheet for Patients: BloggerCourse.com  Fact Sheet for Healthcare Providers: SeriousBroker.it  This test is not yet approved or cleared by the [place] FDA and has been authorized for detection and/or diagnosis of SARS-CoV-2 by FDA under an Emergency Use Authorization (EUA). This EUA will remain in effect (meaning this test can be used) for the duration of the COVID-19 declaration under Section 564(b)(1) of the Act, 21 U.S.C. section 360bbb-3(b)(1), unless the authorization is terminated or revoked.  Performed at [facility], [address] [place], [place] [address]   MRSA PCR Screening     Status: None   Collection Time: 07/10/20  1:22 PM   Specimen: Nasal Mucosa; Nasopharyngeal  Result Value Ref Range Status   MRSA by PCR NEGATIVE NEGATIVE Final    Comment:        The GeneXpert MRSA Assay (FDA approved for NASAL specimens only), is one component of a comprehensive MRSA colonization surveillance program. It is not intended to diagnose MRSA infection nor to guide or monitor treatment for MRSA infections. Performed at [facility], [address], [place], [place] [address]   Culture, blood (routine x 2)     Status: None (Preliminary result)    Collection Time: 07/15/20  6:50 PM   Specimen: BLOOD LEFT FOREARM  Result Value Ref Range Status   Specimen Description BLOOD LEFT FOREARM  Final   Special Requests   Final    BOTTLES DRAWN AEROBIC AND ANAEROBIC Blood Culture results may not be optimal due to an inadequate volume of blood received in culture bottles   Culture   Final    NO GROWTH < 24 HOURS Performed at [facility], [address], [place], [place] [address]    Report Status PENDING  Incomplete  Culture, blood (routine x 2)     Status: None (Preliminary result)   Collection Time: 07/15/20  7:05 PM   Specimen: BLOOD LEFT HAND  Result Value Ref Range Status   Specimen Description BLOOD LEFT HAND  Final   Special Requests   Final    BOTTLES DRAWN AEROBIC ONLY Blood Culture results may not be optimal due to an inadequate volume of blood received in culture bottles   Culture   Final    NO GROWTH < 24 HOURS Performed at [facility], [address] [place], [place] [address]    Report Status PENDING  Incomplete      Radiology Studies: DG CHEST PORT 1 VIEW  Result Date: 07/15/2020 CLINICAL DATA:  45 year old male with fever and altered mental status. EXAM: PORTABLE CHEST 1 VIEW COMPARISON:  None. FINDINGS: The patient is rotated to the left. The heart size and mediastinal contours are within normal limits. Both lungs are clear. The visualized  skeletal structures are unremarkable. IMPRESSION: No acute cardiopulmonary process. Electronically Signed   By: Marliss Coots MD   On: 07/15/2020 09:33    Scheduled Meds: . amLODipine  10 mg Oral Daily  . bethanechol  10 mg Oral TID  . carvedilol  37.5 mg Oral BID  . Chlorhexidine Gluconate Cloth  6 each Topical Daily  . feeding supplement  237 mL Oral TID BM  . folic acid  1 mg Oral Daily  . heparin injection (subcutaneous)  5,000 Units Subcutaneous Q8H  . levETIRAcetam  500 mg Oral BID  . lidocaine  1 application Urethral Once  . losartan  50 mg Per Tube  Daily  . mouth rinse  15 mL Mouth Rinse BID  . multivitamin with minerals  1 tablet Oral Daily  . senna-docusate  1 tablet Oral BID   Continuous Infusions: . sodium chloride    . dextrose       LOS: 10 days   Time spent: 40 minutes   Kert Shackett Estill Cotta, MD [facility]  If 7PM-7AM, please contact night-coverage [email or web address] 07/16/2020, 1:53 PM

## 2020-07-16 NOTE — Progress Notes (Signed)
Attempted to intermittent cath pt. Catheter would not pass through. Pt in a lot of pain. Called MD. Coude catheter ordered.

## 2020-07-17 LAB — CBC
HCT: 32.1 % — ABNORMAL LOW (ref 39.0–52.0)
Hemoglobin: 11.7 g/dL — ABNORMAL LOW (ref 13.0–17.0)
MCH: 35.1 pg — ABNORMAL HIGH (ref 26.0–34.0)
MCHC: 36.4 g/dL — ABNORMAL HIGH (ref 30.0–36.0)
MCV: 96.4 fL (ref 80.0–100.0)
Platelets: 368 10*3/uL (ref 150–400)
RBC: 3.33 MIL/uL — ABNORMAL LOW (ref 4.22–5.81)
RDW: 11.6 % (ref 11.5–15.5)
WBC: 12.2 10*3/uL — ABNORMAL HIGH (ref 4.0–10.5)
nRBC: 0 % (ref 0.0–0.2)

## 2020-07-17 LAB — BASIC METABOLIC PANEL
Anion gap: 11 (ref 5–15)
BUN: 11 mg/dL (ref 6–20)
CO2: 22 mmol/L (ref 22–32)
Calcium: 9.4 mg/dL (ref 8.9–10.3)
Chloride: 94 mmol/L — ABNORMAL LOW (ref 98–111)
Creatinine, Ser: 0.58 mg/dL — ABNORMAL LOW (ref 0.61–1.24)
GFR, Estimated: >60 mL/min (ref >60)
Glucose, Bld: 114 mg/dL — ABNORMAL HIGH (ref 70–99)
Potassium: 3.3 mmol/L — ABNORMAL LOW (ref 3.5–5.1)
Sodium: 127 mmol/L — ABNORMAL LOW (ref 135–145)

## 2020-07-17 LAB — URINE CULTURE: Culture: NO GROWTH

## 2020-07-17 LAB — MAGNESIUM: Magnesium: 1.8 mg/dL (ref 1.7–2.4)

## 2020-07-17 LAB — PROCALCITONIN: Procalcitonin: 0.15 ng/mL

## 2020-07-17 MED ORDER — LOSARTAN POTASSIUM 50 MG PO TABS
100.0000 mg | ORAL_TABLET | Freq: Every day | ORAL | Status: DC
  Administered 2020-07-17 – 2020-07-19 (×3): 100 mg via ORAL
  Filled 2020-07-17 (×3): qty 2

## 2020-07-17 MED ORDER — LOSARTAN POTASSIUM 50 MG PO TABS: 100.0000 mg | ORAL_TABLET | Freq: Every day | ORAL | Status: DC

## 2020-07-17 MED ORDER — MAGNESIUM SULFATE 2 GM/50ML IV SOLN
2.0000 g | Freq: Once | INTRAVENOUS | Status: AC
  Administered 2020-07-17: 2 g via INTRAVENOUS
  Filled 2020-07-17: qty 50

## 2020-07-17 MED ORDER — SODIUM CHLORIDE 1 G PO TABS
1.0000 g | ORAL_TABLET | Freq: Two times a day (BID) | ORAL | Status: DC
  Administered 2020-07-17 – 2020-07-19 (×5): 1 g via ORAL
  Filled 2020-07-17 (×6): qty 1

## 2020-07-17 MED ORDER — POTASSIUM CHLORIDE 20 MEQ PO PACK
40.0000 meq | PACK | Freq: Two times a day (BID) | ORAL | Status: AC
  Administered 2020-07-17 (×2): 40 meq via ORAL
  Filled 2020-07-17 (×2): qty 2

## 2020-07-17 NOTE — Progress Notes (Signed)
Physical Therapy Treatment Patient Details Name: Charles Aguilar MRN: [id number] DOB: 09/24/1975 Today's Date: 07/17/2020    History of Present Illness 45 yo male presenting from home with headache, dizziness, and progressive altered mentation after multiple syncopal episodes. 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 Pt intubated after arrival (2/24) and extubated 2/26 with placement of L frontal IVC on 2/25. Seizure 3/2. Severe diffuse encephalopathy suggested with EEG 3/4.    PT Comments    The pt was seen today by PT/OT to safely progress functional mobility and OOB transfers. The pt was able to complete bed mobility with minA of 1, but requires increased time and repeated multimodal cues with increased time to process. The pt was then able to complete multiple sit-stand transfers from EOB, but requires modA of 2 with BUE support and bilateral blocking of knees as well as cues and facilitation to extend at knees and hips. The pt is able to achieve full upright stand each time, but requires significant cues and increased time to achieve, as well as modA of 2 to steady. The pt was challenged by small pivoting steps at this time, and was able to complete with facilitations at hips and BLE in addition to support to steady. The pt will continue to benefit from skilled PT to improve functional strength, motor planning, coordination, and stability.     Follow Up Recommendations  CIR     Equipment Recommendations  Other (comment) (defer to post acute)    Recommendations for Other Services       Precautions / Restrictions Precautions Precautions: Fall Precaution Comments: seizure Restrictions Weight Bearing Restrictions: No    Mobility  Bed Mobility Overal bed mobility: Needs Assistance Bed Mobility: Supine to Sit;Sit to Supine     Supine to sit: HOB elevated;Min assist     General bed mobility comments: Min A to bring LLE over EOB and then  bring hips towards EOB with bed pad. Pt hold therapist's hand to pull into sitting    Transfers Overall transfer level: Needs assistance Equipment used: 2 person hand held assist Transfers: Stand Pivot Transfers;Sit to/from Stand Sit to Stand: +2 physical assistance;Mod assist Stand pivot transfers: Max assist;+2 physical assistance       General transfer comment: Mod A +2 to power up into standing and gain balance. Bilateral knees blocked. Max A +2 for pivot to recliner movign L  Ambulation/Gait Ambulation/Gait assistance: Max assist;+2 physical assistance;+2 safety/equipment Gait Distance (Feet): 3 Feet Assistive device: 2 person hand held assist Gait Pattern/deviations: Step-to pattern;Decreased stride length;Trunk flexed Gait velocity: decreased   General Gait Details: pt with small pivoting steps but requiring BUE support and maxA of 2 to maintain upright and block bilateral knees      Modified Rankin (Stroke Patients Only) Modified Rankin (Stroke Patients Only) Pre-Morbid Rankin Score: No symptoms Modified Rankin: Severe disability     Balance Overall balance assessment: Needs assistance Sitting-balance support: Feet supported;Bilateral upper extremity supported Sitting balance-Leahy Scale: Poor Sitting balance - Comments: Able to sustain sitting with Min Guard A but poor body awareness and consistently leaning L. Postural control: Posterior lean;Left lateral lean Standing balance support: During functional activity;Bilateral upper extremity supported Standing balance-Leahy Scale: Poor Standing balance comment: Reliant on Max A+2 and presenting with lateral lean L and posterior lean                            Cognition Arousal/Alertness: Awake/alert Behavior During Therapy:  Flat affect Overall Cognitive Status: Impaired/Different from baseline Area of Impairment: Attention;Memory;Following commands;Safety/judgement;Awareness;Problem solving                  Orientation Level: Disoriented to;Time Current Attention Level: Sustained;Selective Memory: Decreased recall of precautions;Decreased short-term memory Following Commands: Follows one step commands with increased time;Follows one step commands inconsistently Safety/Judgement: Decreased awareness of safety;Decreased awareness of deficits Awareness: Intellectual Problem Solving: Slow processing;Decreased initiation;Requires verbal cues;Requires tactile cues;Difficulty sequencing General Comments: Pt awake and agreeable to therapy. At times cursing due to pain with movement. Pt following simple commands with increased cues and time. Easily distracted by environement and reaching to grab heart monitor lines, foley cath, and IV.      Exercises      General Comments General comments (skin integrity, edema, etc.): mother present and supportive      Pertinent Vitals/Pain Pain Assessment: Faces Faces Pain Scale: Hurts little more Pain Location: R hip and buttocks Pain Descriptors / Indicators: Grimacing;Discomfort Pain Intervention(s): Limited activity within patient's tolerance;Monitored during session;Repositioned           PT Goals (current goals can now be found in the care plan section) Acute Rehab PT Goals Patient Stated Goal: none stated by pt, mother present and desires pt to improve PT Goal Formulation: With patient/family Time For Goal Achievement: 07/22/20 Potential to Achieve Goals: Good Progress towards PT goals: Progressing toward goals    Frequency    Min 4X/week      PT Plan Current plan remains appropriate    Co-evaluation PT/OT/SLP Co-Evaluation/Treatment: Yes Reason for Co-Treatment: Necessary to address cognition/behavior during functional activity;For patient/therapist safety;To address functional/ADL transfers PT goals addressed during session: Mobility/safety with mobility;Balance;Strengthening/ROM OT goals addressed during session: ADL's  and self-care      AM-PAC PT "6 Clicks" Mobility   Outcome Measure  Help needed turning from your back to your side while in a flat bed without using bedrails?: A Lot Help needed moving from lying on your back to sitting on the side of a flat bed without using bedrails?: A Lot Help needed moving to and from a bed to a chair (including a wheelchair)?: A Lot Help needed standing up from a chair using your arms (e.g., wheelchair or bedside chair)?: A Lot Help needed to walk in hospital room?: Total Help needed climbing 3-5 steps with a railing? : Total 6 Click Score: 10    End of Session Equipment Utilized During Treatment: Gait belt Activity Tolerance: Patient tolerated treatment well Patient left: in chair;with call bell/phone within reach;with chair alarm set;with family/visitor present Nurse Communication: Mobility status PT Visit Diagnosis: Unsteadiness on feet (R26.81);Other abnormalities of gait and mobility (R26.89);Muscle weakness (generalized) (M62.81);Difficulty in walking, not elsewhere classified (R26.2);Other symptoms and signs involving the nervous system (X41.287)     Time: 8676-7209 PT Time Calculation (min) (ACUTE ONLY): 28 min  Charges:  $Therapeutic Activity: 8-22 mins                     Charles Aguilar, PT, DPT   Acute Rehabilitation Department Pager #: [phone]   Charles Aguilar 07/17/2020, 5:55 PM

## 2020-07-17 NOTE — Progress Notes (Signed)
  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 Pt pulling at catheter, requiring cue to leave it in place. Fed himself water from a straw and crackers with mild oral residue and confusion (putting crackers in soft mitt restraints lying on bed). There were no overt s/s of aspiration, and he appeared to be attentive to POs. Recommend advancing to dysphagia 2, thin liquids; continue to crush meds and assist with self-feeding.  Soft mitt restraints re-applied (they were removed when clinician entered room) given pt's frequent fidgeting with catheter.  SLP will follow    HPI HPI: Patient is a 45 y.o. male with PMH: alcoholism (reportedly drinks vodka continuously, 1-2 pints per day) who presented to ED on 2/24 after c/o headache and dizziness while picking up brother from work. His brother then drove patient home and when patient exited car he began to vomit and fell out of car striking right side of his face. His brother then called EMS. 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 Pt intubated after arrival (2/24) and extubated 2/26 with placement of L frontal IVC on 2/25. Pt with seizure 3/2.      SLP Plan  Continue with current plan of care       Recommendations  Diet recommendations: Dysphagia 2 (fine chop);Thin liquid Liquids provided via: Cup;Straw Medication Administration: Crushed with puree Supervision: Staff to assist with self feeding;Trained caregiver to feed  patient Compensations: Slow rate;Small sips/bites                Oral Care Recommendations: Oral care BID Follow up Recommendations: Inpatient Rehab SLP Visit Diagnosis: Dysphagia, oropharyngeal phase (R13.12) Plan: Continue with current plan of care       GO               Charles L. Samson Frederic, MA CCC/SLP Acute Rehabilitation Services Office number [phone] Pager [phone]  Charles Aguilar 07/17/2020, 4:29 PM

## 2020-07-17 NOTE — Progress Notes (Signed)
Occupational Therapy Treatment Patient Details Name: Charles Aguilar MRN: [id number] DOB: [birth year] Today's Date: 07/17/2020    History of present illness 45 yo male presenting from home with headache, dizziness, and progressive altered mentation after multiple syncopal episodes. 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 Pt intubated after arrival (2/24) and extubated 2/26 with placement of L frontal IVC on 2/25. Seizure 3/2. Severe diffuse encephalopathy suggested with EEG 3/4.   OT comments  Pt progressing towards established OT goals and presenting this session with increased arousal. Pt donning socks at bed level with Max A for gaining figure four position and then Min A to initiate task. Pt performing sit<>stand from EOB with Mod A +2 for power up; x4. Pt requiring bilateral knees blocked and presenting with poor coordination and strength at LUE/LLE. Pt requiring Max A +2 for pivot to recliner. Pt continues to present with decreased balance, strength, and cognition. Continue to recommend dc to CIR and will continue to follow acutely as admitted.    Follow Up Recommendations  CIR    Equipment Recommendations  3 in 1 bedside commode    Recommendations for Other Services Rehab consult    Precautions / Restrictions Precautions Precautions: Fall Precaution Comments: seizure       Mobility Bed Mobility Overal bed mobility: Needs Assistance Bed Mobility: Supine to Sit;Sit to Supine     Supine to sit: HOB elevated;Min assist     General bed mobility comments: Min A to bring LLE over EOB and then bring hips towards EOB with bed pad. Pt hold therapist's hand to pull into sitting    Transfers Overall transfer level: Needs assistance Equipment used: 2 person hand held assist Transfers: Stand Pivot Transfers;Sit to/from Stand Sit to Stand: +2 physical assistance;Mod assist Stand pivot transfers: Max assist;+2 physical assistance        General transfer comment: Mod A +2 to power up into standing and gain balance. Bilateral knees blocked. Max A +2 for pivot to recliner movign L    Balance Overall balance assessment: Needs assistance Sitting-balance support: Feet supported;Bilateral upper extremity supported Sitting balance-Leahy Scale: Poor Sitting balance - Comments: Able to sustain sitting with Min Guard A but poor body awareness and consistently leaning L. Postural control: Posterior lean;Left lateral lean Standing balance support: During functional activity;Bilateral upper extremity supported Standing balance-Leahy Scale: Poor Standing balance comment: Reliant on Max A+2 and presenting with lateral lean L and posterior lean                           ADL either performed or assessed with clinical judgement   ADL Overall ADL's : Needs assistance/impaired     Grooming: Wash/dry face;Set up;Supervision/safety;Cueing for sequencing;Sitting Grooming Details (indicate cue type and reason): Supervision for safety as pt reaching for different lines. Able to sustain attention to task and wash his face with Min cues for sequencing and washing different part (eye, ears, nose)             Lower Body Dressing: Maximal assistance;Bed level Lower Body Dressing Details (indicate cue type and reason): Use of figure four method in bed. Pt requiring Max A to place ankles at knees. Initating by donning socks over toes and then pt able to pull sock of heel with RUE             Functional mobility during ADLs: Maximal assistance;+2 for physical assistance;+2 for safety/equipment General ADL Comments: Pt continues to present with  poor balance, strength, coorindation, safety, cognition, and awareness.     Vision   Vision Assessment?: Yes Alignment/Gaze Preference: Gaze right Additional Comments: Will continue to assess   Perception     Praxis      Cognition Arousal/Alertness: Awake/alert Behavior During  Therapy: Flat affect Overall Cognitive Status: Impaired/Different from baseline Area of Impairment: Attention;Memory;Following commands;Safety/judgement;Awareness;Problem solving                 Orientation Level: Disoriented to;Time ("1993") Current Attention Level: Sustained;Selective Memory: Decreased recall of precautions;Decreased short-term memory Following Commands: Follows one step commands with increased time;Follows one step commands inconsistently Safety/Judgement: Decreased awareness of safety;Decreased awareness of deficits Awareness: Intellectual Problem Solving: Slow processing;Decreased initiation;Requires verbal cues;Requires tactile cues;Difficulty sequencing General Comments: Pt awake and agreeable to therapy. At times cursing due to pain with movement. Pt following simple commands with increased cues and time. Easily distracted by environement and reaching to grab heart monitor lines, foley cath, and IV.        Exercises     Shoulder Instructions       General Comments Mother presenting .HR 108.    Pertinent Vitals/ Pain       Pain Assessment: Faces Faces Pain Scale: Hurts little more Pain Location: R hip and buttocks Pain Descriptors / Indicators: Grimacing;Discomfort Pain Intervention(s): Monitored during session;Limited activity within patient's tolerance;Repositioned  Home Living                                          Prior Functioning/Environment              Frequency  Min 2X/week        Progress Toward Goals  OT Goals(current goals can now be found in the care plan section)  Progress towards OT goals: Progressing toward goals  Acute Rehab OT Goals Patient Stated Goal: none stated by pt, mother present and desires pt to improve OT Goal Formulation: Patient unable to participate in goal setting Time For Goal Achievement: 07/18/20 Potential to Achieve Goals: Good ADL Goals Pt Will Perform Grooming: with min  assist;sitting Pt Will Transfer to Toilet: with mod assist;with transfer board;bedside commode Additional ADL Goal #1: Pt will complete bed mobility min (A) as precursor to adls. Additional ADL Goal #2: Pt will follow 2 step commands 50% of session  Plan Discharge plan remains appropriate    Co-evaluation    PT/OT/SLP Co-Evaluation/Treatment: Yes Reason for Co-Treatment: Complexity of the patient's impairments (multi-system involvement);For patient/therapist safety;To address functional/ADL transfers   OT goals addressed during session: ADL's and self-care      AM-PAC OT "6 Clicks" Daily Activity     Outcome Measure   Help from another person eating meals?: A Lot Help from another person taking care of personal grooming?: A Lot Help from another person toileting, which includes using toliet, bedpan, or urinal?: A Lot Help from another person bathing (including washing, rinsing, drying)?: A Lot Help from another person to put on and taking off regular upper body clothing?: A Lot Help from another person to put on and taking off regular lower body clothing?: A Lot 6 Click Score: 12    End of Session Equipment Utilized During Treatment: Gait belt  OT Visit Diagnosis: Unsteadiness on feet (R26.81);Muscle weakness (generalized) (M62.81)   Activity Tolerance Patient tolerated treatment well   Patient Left in chair;with call bell/phone within reach;with chair alarm set;with  family/visitor present;with restraints reapplied   Nurse Communication Mobility status;Precautions        Time: [id number] OT Time Calculation (min): 28 min  Charges: OT General Charges $OT Visit: 1 Visit OT Treatments $Self Care/Home Management : 8-22 mins  Scarleth Brame MSOT, OTR/L Acute Rehab Pager: [phone] Office: [phone]   Theodoro Grist Sanye Ledesma 07/17/2020, 5:40 PM

## 2020-07-17 NOTE — Progress Notes (Signed)
Inpatient Rehab Admissions Coordinator:   Work up pending s/p seizure like activity. Pt. Is not medically ready for CIR at this time but will continue to follow for potential admit pending bed availability and medical readiness.  Megan Salon, MS, CCC-SLP Rehab Admissions Coordinator  [phone] (celll) [phone] (office)

## 2020-07-17 NOTE — Progress Notes (Signed)
PROGRESS NOTE    Charles Aguilar  RUE:[id number] DOB: 1975/06/06 DOA: 06/24/2020 PCP: Patient, No Pcp Per   Brief Narrative:   [name] is a 45 y.o. male with history of of heavy alcohol, abuse, tobacco abuse (3 to 40 cigarettes daily), TIA presented with Headache, dizziness, vomiting and progressive decreased level of consciousness and a fall.  Ct Head showed 2.0 cm right basal ganglia/thalamic hemorrhage with intraventricular extension concerning for hydrocephalus - s/p EVD placement.CTA Head and Neck showed no large medium vessel occlusion or stenosis. No increase in size of the right basal ganglia hemorrhage. Slightly more intraventricular blood particularly in Rt lateral Ventricle than was seen previously. BP wascontrolled by Claviprex drip and labetalol PRN. HbA1c 5.7, LDL 79, Echo showedEF 55-60%. PTrecommends CIR. Etiology of ICH and IVH is likely due to hypertension with risk factors of alcohol and smoking. MRI showed stable hematoma, blood pressure goal will be less than 160. CIWA protocol, avoid DT. On B1/FA/multivitamin. Mentation continues to wax and wane.   Assessment & Plan:  Intracranial hemorrhage with IVH/hydrocephalus: Acute metabolic encephalopathy: -Suspect in the setting of hypertension, ongoing tobacco abuse and alcohol abuse -CT head shows 2.0 cm right basal ganglia/thalamic hemorrhage with IV extension. -CTA head and neck shows no large or medium vessel occlusion.  -MRI brain shows stable hematoma and IVH, stable size of ventricular system. -2D echo showed ejection fraction of 55 to 60%, LDL: 79, A1c: 5.7 -Status post IVC placement on 2/25-which was obstructed-neurosurgery infused 2 mg of TPA on 2/27. EVD removed on 3/4. -PT/OT recommended CIR -Continue neurochecks, -Neurology and neurosurgery has signed off.  Neurology recommended follow-up with the stroke clinic in about 4 weeks.  Hypertension:  -Goal BP systolic less than 160 -Continue  Coreg and  amlodipine.  Increase losartan dose to 100 mg daily.   -Continue as needed hydralazine and labetalol. HCTZ discontinued due to hyponatremia. Monitor blood pressure closely  Acute hypotonic hyponatremia: -SIADH? -Received hypertonic saline in the ICU. -Sodium-127-130-127 this morning. -We will start patient on sodium tablets 1 g p.o. twice daily and continue to monitor his sodium level closely.  Seizures: -Alcohol related versus electrolyte abnormalities? -Continue Keppra 500 mg twice daily -EEG shows moderate diffuse encephalopathy.  Nonspecific etiology, no definite epileptiform discharges seen throughout the recording. -No further seizure episodes -Continue with seizure precautions  Low-grade fever: -suspect in the setting of intracranial hemorrhage.  He is not hypoxic not tachycardic. -Work-up for infections including-Chest x-ray, UA, UC: Negative.  Blood culture: No growth so far.  Lactic acid: Trended down to 1.0.  No fever overnight.  WBC slightly trended up this morning to 12,000. -PCT: 0.14-to 0.15.  We will hold off antibiotics at this time.  Acute hypoxemic respiratory failure: -In the setting of poor mentation due to stroke.  Required intubation on 2/24.  Is post extubation. -Resolved.  Currently on room air.  Alcohol abuse: With tremors -Continue with CIWA protocol. -Continue B12, thiamine and multivitamins -On seizure protocol  Tobacco abuse: Counseled about cessation  Hypokalemia: Potassium 3.3.  Replenished.  Repeat BMP tomorrow a.m.  Hypomagnesemia: Magnesium 1.8.  Will replenish and repeat magnesium level tomorrow a.m.  Chronic constipation: -Continue Senokot.  Had regular bowel movement yesterday and this morning.  Physical deconditioning:-PT OT recommended CIR  DVT prophylaxis: SCD Code Status: Full code Family Communication: Patient's mom present at bedside.  Plan of care discussed with patient in length and he verbalized understanding and  agreed with it. Disposition Plan: CIR  Consultants:  Neurology  Neurosurgery  PCCM  Procedures:   EEG  Intubation  Status post EVD placement  Antimicrobials:   None  Status is: Inpatient  Dispo: The patient is from: Home              Anticipated d/c is to: CIR              Patient currently is not medically stable to d/c.   Difficult to place patient No    Subjective: Patient seen and examined.  Resting comfortably on the bed.  Mom at the bedside.  Alert, awake and following commands however still appears to be confused.  No fever overnight.  He denies any symptoms including headache, chest pain, nausea, vomiting, abdominal pain.  He had bowel movement yesterday and this morning.  Objective: Vitals:   07/17/20 1011 07/17/20 1100 07/17/20 1158 07/17/20 1200  BP: 116/84 (!) 126/93  (!) 140/96  Pulse:  73  90  Resp:  20  17  Temp:   98.7 F (37.1 C)   TempSrc:   Oral   SpO2:  99%  100%  Weight:      Height:        Intake/Output Summary (Last 24 hours) at 07/17/2020 1354 Last data filed at 07/17/2020 1200 Gross per 24 hour  Intake 1080 ml  Output 1535 ml  Net -455 ml   Filed Weights   07/07/20 0731 07/08/20 0400  Weight: 69.9 kg 70.1 kg    Examination:  General exam: Appears calm and comfortable, appears chronically ill, weak Respiratory system: Clear to auscultation. Respiratory effort normal. Cardiovascular system: S1 & S2 heard, RRR. No JVD, murmurs, rubs, gallops or clicks. No pedal edema. Gastrointestinal system: Abdomen is nondistended, soft and nontender. No organomegaly or masses felt. Normal bowel sounds heard. Central nervous system: Alert, awake, following commands but confused.  Extremities: Symmetric 5 x 5 power. Skin: No rashes, lesions or ulcers Psychiatry: Judgement and insight appear normal. Mood & affect appropriate.    Data Reviewed: I have personally reviewed following labs and imaging studies  CBC: Recent Labs  Lab  07/13/20 1806 07/14/20 0035 07/15/20 0025 07/16/20 0215 07/17/20 0106  WBC 10.4 11.8* 10.5 10.3 12.2*  HGB 13.6 13.3 11.9* 12.2* 11.7*  HCT 38.1* 36.2* 34.0* 33.3* 32.1*  MCV 97.2 95.5 98.3 96.5 96.4  PLT 264 279 292 359 368   Basic Metabolic Panel: Recent Labs  Lab 07/13/20 0639 07/13/20 1637 07/14/20 0035 07/14/20 1045 07/14/20 1616 07/14/20 1751 07/15/20 0025 07/15/20 1412 07/15/20 1903 07/16/20 0215 07/17/20 0106  NA  --    < > 128*  125* 129* 129*   < > 127* 129* 128* 130*  131* 127*  K  --    < > 3.9  3.6 3.3*  --   --  3.6  --   --  3.5 3.3*  CL  --    < > 97*  94* 98  --   --  98  --   --  98 94*  CO2  --    < > 17*  18* 18*  --   --  20*  --   --  22 22  GLUCOSE  --    < > 103*  104* 127*  --   --  126*  --   --  123* 114*  BUN  --    < > 14  14 14   --   --  14  --   --  14 11  CREATININE  --    < > 0.58*  0.60* 0.57*  --   --  0.53*  --   --  0.52* 0.58*  CALCIUM  --    < > 9.3  9.2 9.0  --   --  9.2  --   --  9.4 9.4  MG 1.6*  --   --   --  2.0  --   --   --   --  1.8 1.8  PHOS 4.4  --   --   --   --   --   --   --   --   --   --    < > = values in this interval not displayed.   GFR: Estimated Creatinine Clearance: 116.8 mL/min (A) (by C-G formula based on SCr of 0.58 mg/dL (L)). Liver Function Tests: Recent Labs  Lab 07/16/20 0215  AST 34  ALT 43  ALKPHOS 113  BILITOT 0.7  PROT 8.0  ALBUMIN 3.0*   No results for input(s): LIPASE, AMYLASE in the last 168 hours. Recent Labs  Lab 07/16/20 1637  AMMONIA 40*   Coagulation Profile: No results for input(s): INR, PROTIME in the last 168 hours. Cardiac Enzymes: No results for input(s): CKTOTAL, CKMB, CKMBINDEX, TROPONINI in the last 168 hours. BNP (last 3 results) No results for input(s): PROBNP in the last 8760 hours. HbA1C: No results for input(s): HGBA1C in the last 72 hours. CBG: No results for input(s): GLUCAP in the last 168 hours. Lipid Profile: No results for input(s): CHOL, HDL,  LDLCALC, TRIG, CHOLHDL, LDLDIRECT in the last 72 hours. Thyroid Function Tests: No results for input(s): TSH, T4TOTAL, FREET4, T3FREE, THYROIDAB in the last 72 hours. Anemia Panel: No results for input(s): VITAMINB12, FOLATE, FERRITIN, TIBC, IRON, RETICCTPCT in the last 72 hours. Sepsis Labs: Recent Labs  Lab 07/16/20 0947 07/16/20 1413 07/16/20 1637 07/16/20 1947 07/17/20 0106  PROCALCITON  --   --  0.14  --  0.15  LATICACIDVEN 2.2* 1.6 1.3 1.0  --     Recent Results (from the past 240 hour(s))  MRSA PCR Screening     Status: None   Collection Time: 07/10/20  1:22 PM   Specimen: Nasal Mucosa; Nasopharyngeal  Result Value Ref Range Status   MRSA by PCR NEGATIVE NEGATIVE Final    Comment:        The GeneXpert MRSA Assay (FDA approved for NASAL specimens only), is one component of a comprehensive MRSA colonization surveillance program. It is not intended to diagnose MRSA infection nor to guide or monitor treatment for MRSA infections. Performed at [facility], [address], [place], [place] [address]   Culture, blood (routine x 2)     Status: None (Preliminary result)   Collection Time: 07/15/20  6:50 PM   Specimen: BLOOD LEFT FOREARM  Result Value Ref Range Status   Specimen Description BLOOD LEFT FOREARM  Final   Special Requests   Final    BOTTLES DRAWN AEROBIC AND ANAEROBIC Blood Culture results may not be optimal due to an inadequate volume of blood received in culture bottles   Culture   Final    NO GROWTH 2 DAYS Performed at [facility], [address], [place], [place] [address]    Report Status PENDING  Incomplete  Culture, blood (routine x 2)     Status: None (Preliminary result)   Collection Time: 07/15/20  7:05 PM   Specimen: BLOOD LEFT  HAND  Result Value Ref Range Status   Specimen Description BLOOD LEFT HAND  Final   Special Requests   Final    BOTTLES DRAWN AEROBIC ONLY Blood Culture results may not be optimal due to an inadequate  volume of blood received in culture bottles   Culture   Final    NO GROWTH 2 DAYS Performed at [facility], [address] [place], [place] [address]    Report Status PENDING  Incomplete  Culture, Urine     Status: None   Collection Time: 07/15/20  9:22 PM   Specimen: Urine, Catheterized  Result Value Ref Range Status   Specimen Description URINE, CATHETERIZED  Final   Special Requests NONE  Final   Culture   Final    NO GROWTH Performed at [facility], [address] [place], [place] [address]    Report Status 07/17/2020 FINAL  Final      Radiology Studies: No results found.  Scheduled Meds: . amLODipine  10 mg Oral Daily  . bethanechol  10 mg Oral TID  . carvedilol  37.5 mg Oral BID  . Chlorhexidine Gluconate Cloth  6 each Topical Daily  . feeding supplement  237 mL Oral TID BM  . folic acid  1 mg Oral Daily  . heparin injection (subcutaneous)  5,000 Units Subcutaneous Q8H  . levETIRAcetam  500 mg Oral BID  . lidocaine  1 application Urethral Once  . losartan  100 mg Oral Daily  . mouth rinse  15 mL Mouth Rinse BID  . multivitamin with minerals  1 tablet Oral Daily  . potassium chloride  40 mEq Oral BID  . senna-docusate  1 tablet Oral BID  . sodium chloride  1 g Oral BID   Continuous Infusions: . sodium chloride       LOS: 11 days   Time spent: 40 minutes   Charles Aguilar Charles Cotta Jenette Rayson, MD [facility]  If 7PM-7AM, please contact night-coverage [email or web address] 07/17/2020, 1:54 PM

## 2020-07-18 ENCOUNTER — Inpatient Hospital Stay (HOSPITAL_COMMUNITY): Payer: Medicaid Other

## 2020-07-18 LAB — BASIC METABOLIC PANEL
Anion gap: 12 (ref 5–15)
BUN: 11 mg/dL (ref 6–20)
CO2: 20 mmol/L — ABNORMAL LOW (ref 22–32)
Calcium: 9.4 mg/dL (ref 8.9–10.3)
Chloride: 90 mmol/L — ABNORMAL LOW (ref 98–111)
Creatinine, Ser: 0.56 mg/dL — ABNORMAL LOW (ref 0.61–1.24)
GFR, Estimated: >60 mL/min (ref >60)
Glucose, Bld: 108 mg/dL — ABNORMAL HIGH (ref 70–99)
Potassium: 4.2 mmol/L (ref 3.5–5.1)
Sodium: 122 mmol/L — ABNORMAL LOW (ref 135–145)

## 2020-07-18 LAB — PROCALCITONIN: Procalcitonin: 0.73 ng/mL

## 2020-07-18 LAB — CBC
HCT: 34.3 % — ABNORMAL LOW (ref 39.0–52.0)
Hemoglobin: 12.5 g/dL — ABNORMAL LOW (ref 13.0–17.0)
MCH: 35.3 pg — ABNORMAL HIGH (ref 26.0–34.0)
MCHC: 36.4 g/dL — ABNORMAL HIGH (ref 30.0–36.0)
MCV: 96.9 fL (ref 80.0–100.0)
Platelets: 376 10*3/uL (ref 150–400)
RBC: 3.54 MIL/uL — ABNORMAL LOW (ref 4.22–5.81)
RDW: 11.4 % — ABNORMAL LOW (ref 11.5–15.5)
WBC: 19.4 10*3/uL — ABNORMAL HIGH (ref 4.0–10.5)
nRBC: 0 % (ref 0.0–0.2)

## 2020-07-18 LAB — MAGNESIUM: Magnesium: 1.5 mg/dL — ABNORMAL LOW (ref 1.7–2.4)

## 2020-07-18 LAB — SODIUM
Sodium: 120 mmol/L — ABNORMAL LOW (ref 135–145)
Sodium: 123 mmol/L — ABNORMAL LOW (ref 135–145)

## 2020-07-18 MED ORDER — MAGNESIUM SULFATE 2 GM/50ML IV SOLN
2.0000 g | Freq: Once | INTRAVENOUS | Status: AC
  Administered 2020-07-18: 2 g via INTRAVENOUS
  Filled 2020-07-18: qty 50

## 2020-07-18 NOTE — Progress Notes (Signed)
Inpatient Rehab Admissions Coordinator:   Pt. Is not medically ready for CIR at this time. I will follow for potential admission pending medical readiness and bed availability.   Megan Salon, MS, CCC-SLP Rehab Admissions Coordinator  [phone] (celll) [phone] (office)

## 2020-07-18 NOTE — Progress Notes (Signed)
PT Cancellation Note  Patient Details Name: Montana Fassnacht MRN: [id number] DOB: 1975/06/07   Cancelled Treatment:    Reason Eval/Treat Not Completed: Medical issues which prohibited therapy per chart, patient with sudden neuro changes and plan for stat CT. Will hold for now and return on next date of service if medically ready.    Madelaine Etienne, DPT, PN1   Supplemental Physical Therapist [facility]    Pager [phone] Acute Rehab Office [phone]

## 2020-07-18 NOTE — Progress Notes (Addendum)
PROGRESS NOTE    Charles Aguilar  RAQ:[id number] DOB: 06-Apr-1976 DOA: 07/07/2020 PCP: Patient, No Pcp Per   Brief Narrative:   Charles Aguilar is a 45 y.o. male with history of of heavy alcohol, abuse, tobacco abuse (3 to 40 cigarettes daily), TIA presented with Headache, dizziness, vomiting and progressive decreased level of consciousness and a fall.  Ct Head showed 2.0 cm right basal ganglia/thalamic hemorrhage with intraventricular extension concerning for hydrocephalus - s/p EVD placement.CTA Head and Neck showed no large medium vessel occlusion or stenosis. No increase in size of the right basal ganglia hemorrhage. Slightly more intraventricular blood particularly in Rt lateral Ventricle than was seen previously. BP wascontrolled by Claviprex drip and labetalol PRN. HbA1c 5.7, LDL 79, Echo showedEF 55-60%. PTrecommends CIR. Etiology of ICH and IVH is likely due to hypertension with risk factors of alcohol and smoking. MRI showed stable hematoma, blood pressure goal will be less than 160. CIWA protocol, avoid DT. On B1/FA/multivitamin. Mentation continues to wax and wane.  PT/OT recommend CIR-pending bed availability   Assessment & Plan:  Intracranial hemorrhage with IVH/hydrocephalus: Acute metabolic encephalopathy: -Suspect in the setting of hypertension, ongoing tobacco abuse and alcohol abuse -Status post IVC placement on 2/25-which was obstructed-neurosurgery infused 2 mg of TPA on 2/27. EVD removed on 3/4. -PT/OT recommended CIR -Neurology and neurosurgery has signed off.  Neurology recommended follow-up with the stroke clinic in about 4 weeks.  Hypertension: Stable this morning -Goal BP systolic less than 160 -Continue Coreg, losartan and  amlodipine.   -Continue as needed hydralazine and labetalol. HCTZ discontinued due to hyponatremia. Monitor blood pressure closely  Acute hypotonic hyponatremia: -SIADH? -Received hypertonic saline in the  ICU. -Sodium-127-130-127-122 this morning. -Continue sodium tablets 1 g p.o. twice daily and continue to monitor his sodium level closely.  Seizures: -Alcohol related versus electrolyte abnormalities? -Continue Keppra 500 mg twice daily -EEG shows moderate diffuse encephalopathy.  Nonspecific etiology, no definite epileptiform discharges seen throughout the recording. -No further seizure episodes -Continue with seizure precautions  Low-grade fever: -suspect in the setting of intracranial hemorrhage.  He is not hypoxic not tachycardic. -Work-up for infections including-Chest x-ray, UA, UC: Negative.  Blood culture: No growth so far.  Lactic acid: Trended down to 1.0.  No fever overnight.  WBC  trended up this morning to 12,000-19,000 -PCT: 0.14-to 0.15-0.73.   -Discussed with ID Dr. Vu-recommend to repeat chest x-ray and he will see patient tomorrow-recommend to hold off antibiotics at this time-appreciate help.  Acute hypoxemic respiratory failure: -In the setting of poor mentation due to stroke.  Required intubation on 2/24.  Is post extubation. -Resolved.  Currently on room air.  Alcohol abuse: With tremors -Continue with CIWA protocol. -Continue B12, thiamine and multivitamins -On seizure protocol  Tobacco abuse: Counseled about cessation  Hypokalemia: Resolved  Hypomagnesemia: Magnesium 1.5.  Will replenish and repeat magnesium level tomorrow a.m.  Chronic constipation: -Continue Senokot.   Physical deconditioning:-PT OT recommended CIR  Addendum: RN notified patient's poor mentation.  Vitals remained stable, maintaining oxygen saturation on room air, drowsy-responsive to painful stimuli.  Will get stat CT head without contrast  for further evaluation.  Mom at the bedside-notified her and she agreed with the plan.  DVT prophylaxis: SCD Code Status: Full code Family Communication: Mom present at bedside.  Plan of care discussed with patient in length and he verbalized  understanding and agreed with it. Disposition Plan: CIR  Consultants:   Neurology  Neurosurgery  PCCM  ID  Procedures:  EEG  Intubation  Status post EVD placement  Antimicrobials:   None  Status is: Inpatient  Dispo: The patient is from: Home              Anticipated d/c is to: CIR              Patient currently is not medically stable to d/c.   Difficult to place patient No    Subjective: Patient seen and examined.  Alert and awake, denies any complaints.  Appears confused.  No fever overnight.  No seizures reported.  Objective: Vitals:   07/17/20 1939 07/17/20 2327 07/18/20 0409 07/18/20 0731  BP: (!) 132/93 (!) 151/100 (!) 148/96 (!) 138/101  Pulse: 94 98 93 88  Resp: 20 17 17 20   Temp: 98.2 F (36.8 C) 99.9 F (37.7 C) 98.8 F (37.1 C) 99.6 F (37.6 C)  TempSrc: Oral Oral Oral Oral  SpO2: 100% 100% 99% 100%  Weight:      Height:        Intake/Output Summary (Last 24 hours) at 07/18/2020 1056 Last data filed at 07/18/2020 [id number] Gross per 24 hour  Intake 840 ml  Output 2950 ml  Net -2110 ml   Filed Weights   07/07/20 0731 07/08/20 0400  Weight: 69.9 kg 70.1 kg    Examination:  General exam: Appears calm and comfortable, appears chronically ill, weak Respiratory system: Clear to auscultation. Respiratory effort normal. Cardiovascular system: S1 & S2 heard, RRR. No JVD, murmurs, rubs, gallops or clicks. No pedal edema. Gastrointestinal system: Abdomen is nondistended, soft and nontender. No organomegaly or masses felt. Normal bowel sounds heard. Central nervous system: Alert, awake, following commands but confused.  Extremities: Symmetric 5 x 5 power. Skin: No rashes, lesions or ulcers Psychiatry: Judgement and insight appear normal. Mood & affect appropriate.    Data Reviewed: I have personally reviewed following labs and imaging studies  CBC: Recent Labs  Lab 07/14/20 0035 07/15/20 0025 07/16/20 0215 07/17/20 0106 07/18/20 0348  WBC  11.8* 10.5 10.3 12.2* 19.4*  HGB 13.3 11.9* 12.2* 11.7* 12.5*  HCT 36.2* 34.0* 33.3* 32.1* 34.3*  MCV 95.5 98.3 96.5 96.4 96.9  PLT 279 292 359 368 376   Basic Metabolic Panel: Recent Labs  Lab 07/13/20 0639 07/13/20 1637 07/14/20 1045 07/14/20 1616 07/14/20 1751 07/15/20 0025 07/15/20 1412 07/15/20 1903 07/16/20 0215 07/17/20 0106 07/18/20 0348  NA  --    < > 129* 129*   < > 127* 129* 128* 130*  131* 127* 122*  K  --    < > 3.3*  --   --  3.6  --   --  3.5 3.3* 4.2  CL  --    < > 98  --   --  98  --   --  98 94* 90*  CO2  --    < > 18*  --   --  20*  --   --  22 22 20*  GLUCOSE  --    < > 127*  --   --  126*  --   --  123* 114* 108*  BUN  --    < > 14  --   --  14  --   --  14 11 11   CREATININE  --    < > 0.57*  --   --  0.53*  --   --  0.52* 0.58* 0.56*  CALCIUM  --    < > 9.0  --   --  9.2  --   --  9.4 9.4 9.4  MG 1.6*  --   --  2.0  --   --   --   --  1.8 1.8 1.5*  PHOS 4.4  --   --   --   --   --   --   --   --   --   --    < > = values in this interval not displayed.   GFR: Estimated Creatinine Clearance: 116.8 mL/min (A) (by C-G formula based on SCr of 0.56 mg/dL (L)). Liver Function Tests: Recent Labs  Lab 07/16/20 0215  AST 34  ALT 43  ALKPHOS 113  BILITOT 0.7  PROT 8.0  ALBUMIN 3.0*   No results for input(s): LIPASE, AMYLASE in the last 168 hours. Recent Labs  Lab 07/16/20 1637  AMMONIA 40*   Coagulation Profile: No results for input(s): INR, PROTIME in the last 168 hours. Cardiac Enzymes: No results for input(s): CKTOTAL, CKMB, CKMBINDEX, TROPONINI in the last 168 hours. BNP (last 3 results) No results for input(s): PROBNP in the last 8760 hours. HbA1C: No results for input(s): HGBA1C in the last 72 hours. CBG: No results for input(s): GLUCAP in the last 168 hours. Lipid Profile: No results for input(s): CHOL, HDL, LDLCALC, TRIG, CHOLHDL, LDLDIRECT in the last 72 hours. Thyroid Function Tests: No results for input(s): TSH, T4TOTAL, FREET4,  T3FREE, THYROIDAB in the last 72 hours. Anemia Panel: No results for input(s): VITAMINB12, FOLATE, FERRITIN, TIBC, IRON, RETICCTPCT in the last 72 hours. Sepsis Labs: Recent Labs  Lab 07/16/20 0947 07/16/20 1413 07/16/20 1637 07/16/20 1947 07/17/20 0106 07/18/20 0348  PROCALCITON  --   --  0.14  --  0.15 0.73  LATICACIDVEN 2.2* 1.6 1.3 1.0  --   --     Recent Results (from the past 240 hour(s))  MRSA PCR Screening     Status: None   Collection Time: 07/10/20  1:22 PM   Specimen: Nasal Mucosa; Nasopharyngeal  Result Value Ref Range Status   MRSA by PCR NEGATIVE NEGATIVE Final    Comment:        The GeneXpert MRSA Assay (FDA approved for NASAL specimens only), is one component of a comprehensive MRSA colonization surveillance program. It is not intended to diagnose MRSA infection nor to guide or monitor treatment for MRSA infections. Performed at [facility], [address], [place], [place] [address]   Culture, blood (routine x 2)     Status: None (Preliminary result)   Collection Time: 07/15/20  6:50 PM   Specimen: BLOOD LEFT FOREARM  Result Value Ref Range Status   Specimen Description BLOOD LEFT FOREARM  Final   Special Requests   Final    BOTTLES DRAWN AEROBIC AND ANAEROBIC Blood Culture results may not be optimal due to an inadequate volume of blood received in culture bottles   Culture   Final    NO GROWTH 2 DAYS Performed at [facility], [address] [place], [place] [address]    Report Status PENDING  Incomplete  Culture, blood (routine x 2)     Status: None (Preliminary result)   Collection Time: 07/15/20  7:05 PM   Specimen: BLOOD LEFT HAND  Result Value Ref Range Status   Specimen Description BLOOD LEFT HAND  Final   Special Requests   Final    BOTTLES DRAWN AEROBIC ONLY Blood Culture results may not be optimal due to an inadequate volume of blood  received in culture bottles   Culture   Final    NO GROWTH 2 DAYS Performed at [facility], [address] [place], [place] [address]    Report Status PENDING  Incomplete  Culture, Urine     Status: None   Collection Time: 07/15/20  9:22 PM   Specimen: Urine, Catheterized  Result Value Ref Range Status   Specimen Description URINE, CATHETERIZED  Final   Special Requests NONE  Final   Culture   Final    NO GROWTH Performed at [facility], [address], [place], [place] [address]    Report Status 07/17/2020 FINAL  Final      Radiology Studies: No results found.  Scheduled Meds: . amLODipine  10 mg Oral Daily  . bethanechol  10 mg Oral TID  . carvedilol  37.5 mg Oral BID  . Chlorhexidine Gluconate Cloth  6 each Topical Daily  . feeding supplement  237 mL Oral TID BM  . folic acid  1 mg Oral Daily  . heparin injection (subcutaneous)  5,000 Units Subcutaneous Q8H  . levETIRAcetam  500 mg Oral BID  . lidocaine  1 application Urethral Once  . losartan  100 mg Oral Daily  . mouth rinse  15 mL Mouth Rinse BID  . multivitamin with minerals  1 tablet Oral Daily  . senna-docusate  1 tablet Oral BID  . sodium chloride  1 g Oral BID   Continuous Infusions: . sodium chloride       LOS: 12 days   Time spent: 40 minutes   Noralee Dutko Estill Cotta, MD [facility]  If 7PM-7AM, please contact night-coverage [email or web address] 07/18/2020, 10:56 AM

## 2020-07-18 NOTE — Progress Notes (Signed)
Patient had sudden neuro change, now very drowsy, only responsd to pain, won't open eyes or follow commands. Vitals normal. Dr. Jacqulyn Bath paged. [phone] Dr. Jacqulyn Bath at bedside. STAT head CT ordered.

## 2020-07-19 ENCOUNTER — Inpatient Hospital Stay (HOSPITAL_COMMUNITY): Payer: Medicaid Other

## 2020-07-19 ENCOUNTER — Inpatient Hospital Stay: Payer: Self-pay

## 2020-07-19 DIAGNOSIS — D72829 Elevated white blood cell count, unspecified: Secondary | ICD-10-CM

## 2020-07-19 DIAGNOSIS — G9340 Encephalopathy, unspecified: Secondary | ICD-10-CM

## 2020-07-19 DIAGNOSIS — E871 Hypo-osmolality and hyponatremia: Secondary | ICD-10-CM

## 2020-07-19 DIAGNOSIS — R509 Fever, unspecified: Secondary | ICD-10-CM

## 2020-07-19 LAB — BASIC METABOLIC PANEL
Anion gap: 13 (ref 5–15)
Anion gap: 11 (ref 5–15)
BUN: 13 mg/dL (ref 6–20)
BUN: 16 mg/dL (ref 6–20)
CO2: 19 mmol/L — ABNORMAL LOW (ref 22–32)
CO2: 21 mmol/L — ABNORMAL LOW (ref 22–32)
Calcium: 9.5 mg/dL (ref 8.9–10.3)
Calcium: 9.5 mg/dL (ref 8.9–10.3)
Chloride: 91 mmol/L — ABNORMAL LOW (ref 98–111)
Chloride: 92 mmol/L — ABNORMAL LOW (ref 98–111)
Creatinine, Ser: 0.54 mg/dL — ABNORMAL LOW (ref 0.61–1.24)
Creatinine, Ser: 0.62 mg/dL (ref 0.61–1.24)
GFR, Estimated: >60 mL/min (ref >60)
GFR, Estimated: >60 mL/min (ref >60)
Glucose, Bld: 124 mg/dL — ABNORMAL HIGH (ref 70–99)
Glucose, Bld: 117 mg/dL — ABNORMAL HIGH (ref 70–99)
Potassium: 3.8 mmol/L (ref 3.5–5.1)
Potassium: 4 mmol/L (ref 3.5–5.1)
Sodium: 123 mmol/L — ABNORMAL LOW (ref 135–145)
Sodium: 124 mmol/L — ABNORMAL LOW (ref 135–145)

## 2020-07-19 LAB — CBC WITH DIFFERENTIAL/PLATELET
Abs Immature Granulocytes: 0.14 10*3/uL — ABNORMAL HIGH (ref 0.00–0.07)
Basophils Absolute: 0.1 10*3/uL (ref 0.0–0.1)
Basophils Relative: 0 %
Eosinophils Absolute: 0.1 10*3/uL (ref 0.0–0.5)
Eosinophils Relative: 0 %
HCT: 32.5 % — ABNORMAL LOW (ref 39.0–52.0)
Hemoglobin: 11.9 g/dL — ABNORMAL LOW (ref 13.0–17.0)
Immature Granulocytes: 1 %
Lymphocytes Relative: 8 %
Lymphs Abs: 1.7 10*3/uL (ref 0.7–4.0)
MCH: 35.4 pg — ABNORMAL HIGH (ref 26.0–34.0)
MCHC: 36.6 g/dL — ABNORMAL HIGH (ref 30.0–36.0)
MCV: 96.7 fL (ref 80.0–100.0)
Monocytes Absolute: 1.4 10*3/uL — ABNORMAL HIGH (ref 0.1–1.0)
Monocytes Relative: 6 %
Neutro Abs: 18.6 10*3/uL — ABNORMAL HIGH (ref 1.7–7.7)
Neutrophils Relative %: 85 %
Platelets: 426 10*3/uL — ABNORMAL HIGH (ref 150–400)
RBC: 3.36 MIL/uL — ABNORMAL LOW (ref 4.22–5.81)
RDW: 11.6 % (ref 11.5–15.5)
WBC: 22 10*3/uL — ABNORMAL HIGH (ref 4.0–10.5)
nRBC: 0 % (ref 0.0–0.2)

## 2020-07-19 LAB — CBC
HCT: 32.5 % — ABNORMAL LOW (ref 39.0–52.0)
Hemoglobin: 11.6 g/dL — ABNORMAL LOW (ref 13.0–17.0)
MCH: 34.4 pg — ABNORMAL HIGH (ref 26.0–34.0)
MCHC: 35.7 g/dL (ref 30.0–36.0)
MCV: 96.4 fL (ref 80.0–100.0)
Platelets: 387 10*3/uL (ref 150–400)
RBC: 3.37 MIL/uL — ABNORMAL LOW (ref 4.22–5.81)
RDW: 11.3 % — ABNORMAL LOW (ref 11.5–15.5)
WBC: 22.6 10*3/uL — ABNORMAL HIGH (ref 4.0–10.5)
nRBC: 0 % (ref 0.0–0.2)

## 2020-07-19 LAB — HIV ANTIBODY (ROUTINE TESTING W REFLEX): HIV Screen 4th Generation wRfx: NONREACTIVE

## 2020-07-19 LAB — MAGNESIUM: Magnesium: 1.8 mg/dL (ref 1.7–2.4)

## 2020-07-19 LAB — SODIUM
Sodium: 123 mmol/L — ABNORMAL LOW (ref 135–145)
Sodium: 126 mmol/L — ABNORMAL LOW (ref 135–145)
Sodium: 128 mmol/L — ABNORMAL LOW (ref 135–145)
Sodium: 128 mmol/L — ABNORMAL LOW (ref 135–145)

## 2020-07-19 LAB — GLUCOSE, CAPILLARY
Glucose-Capillary: 112 mg/dL — ABNORMAL HIGH (ref 70–99)
Glucose-Capillary: 123 mg/dL — ABNORMAL HIGH (ref 70–99)
Glucose-Capillary: 124 mg/dL — ABNORMAL HIGH (ref 70–99)
Glucose-Capillary: 176 mg/dL — ABNORMAL HIGH (ref 70–99)

## 2020-07-19 LAB — OSMOLALITY: Osmolality: 270 mOsm/kg — ABNORMAL LOW (ref 275–295)

## 2020-07-19 LAB — AMMONIA: Ammonia: 29 umol/L (ref 9–35)

## 2020-07-19 MED ORDER — SODIUM CHLORIDE 0.9 % IV BOLUS: 1000.0000 mL | Freq: Once | INTRAVENOUS | Status: DC

## 2020-07-19 MED ORDER — SODIUM CHLORIDE 3 % IV SOLN
INTRAVENOUS | Status: DC
  Filled 2020-07-19: qty 500

## 2020-07-19 MED ORDER — LEVETIRACETAM 750 MG PO TABS
750.0000 mg | ORAL_TABLET | Freq: Two times a day (BID) | ORAL | Status: DC
  Administered 2020-07-19: 750 mg via ORAL
  Filled 2020-07-19 (×2): qty 1

## 2020-07-19 MED ORDER — LEVETIRACETAM IN NACL 1500 MG/100ML IV SOLN
1500.0000 mg | INTRAVENOUS | Status: AC
  Administered 2020-07-19: 1500 mg via INTRAVENOUS
  Filled 2020-07-19: qty 100

## 2020-07-19 MED ORDER — SODIUM CHLORIDE 3 % IV SOLN
INTRAVENOUS | Status: AC
  Filled 2020-07-19 (×2): qty 500

## 2020-07-19 MED ORDER — IOHEXOL 9 MG/ML PO SOLN
ORAL | Status: AC
  Filled 2020-07-19: qty 1000

## 2020-07-19 MED ORDER — THIAMINE HCL 100 MG/ML IJ SOLN
100.0000 mg | Freq: Every day | INTRAMUSCULAR | Status: DC
  Administered 2020-07-19: 100 mg via INTRAVENOUS
  Filled 2020-07-19: qty 2

## 2020-07-19 MED ORDER — LEVETIRACETAM IN NACL 1000 MG/100ML IV SOLN: 1000.0000 mg | INTRAVENOUS | Status: DC

## 2020-07-19 NOTE — Progress Notes (Signed)
NAME:  Charles Aguilar, MRN:  [id number], DOB:  12/28/1975, LOS: 13 ADMISSION DATE:  06/21/2020, CONSULTATION DATE:  2/24 REFERRING MD:  Dr. Iver Nestle, CHIEF COMPLAINT:  ICH   Brief History:  45 year old male presenting with headache and AMS found to have right basal ganglia/ thalamic ICH with IVH extension and hypertensive emergency. Transferred to [facility] on 3/5.  Called back 3/9 with seizure and hyponatremia.    Past Medical History:   has a past medical history of Epilepsy (HCC). Heavy alcohol abuse, tobacco abuse  Significant Hospital Events:  2/24 admit for ICH/ intubated  2/25 EVD placed 2/27, 2/28, 3/1 EVD obstructed; remains on vanc with EVD 3/2 reported seizure, EEG with diffuse encephalopathy  3/4 EVD removed 3/5 LTM EEG removed; transferred out ICU to [facility] 3/7 rising leukocytosis, but clinically asymptomatic  3/8 reported brief seizure; CTH unchanged  Consults:  Neurology Neurosurgery ID   Procedures:  EVD 2/25- 3/4  Significant Diagnostic Tests:  CT head 2/24 > 2.0 cm right basal ganglia/thalamic hemorrhage with intraventricular Extension. Upon further review there is new mild to moderate panventricular dilatation compared to prior exam. MRI Brain 2/25 >>> 1. Motion degraded study. 2. Right basal ganglia/thalamic intraparenchymal hemorrhage decompressing into the ventricular system with large amount of blood within the supratentorial and infratentorial ventricular system, stable. 3. Left frontal approach ventricular drain with the tip within the frontal horn of the left lateral ventricle with stable size of the ventricular system.  Micro Data:  2/24 SARS2/ flu >> neg 2/28 MRSA >> neg 3/5 UCx>> neg 3/5 BCx >>   Antimicrobials:  vanc 2/25 >> 3/3  (for EVD)  Interim History / Subjective:  Reported he brief seizure yesterday as well.  Repeat CTH with expected changes, no acute new process.   Called back for new seizure today lasting 15 sec with stiffness on  left which happened when sitting on side of bed with PT. Mother at bedside, reports patient has not been that interactive today, told her this morning he did not feel well.    On my arrival into room, patient had just had second brief seizure, similar in nature, and remains postictal  Glucose 178 Second PIV started  Vitals checked, saturations 99% on room air, SBP 78, NS bolus started.  RRT called for assistance   Objective   Blood pressure 104/61, pulse 99, temperature (!) 101.1 F (38.4 C), temperature source Oral, resp. rate 19, height 6\' 2"  (1.88 m), weight 70.1 kg, SpO2 99 %.        Intake/Output Summary (Last 24 hours) at 07/19/2020 1346 Last data filed at 07/19/2020 1100 Gross per 24 hour  Intake 360 ml  Output 1000 ml  Net -640 ml   Filed Weights   07/07/20 0731 07/08/20 0400  Weight: 69.9 kg 70.1 kg   General:  Ill appearing adult male lying in bed minimally responsive HEENT: MM pink/moist, pupils 3/reactive, anicteric, no nystagmus or gaze Neuro: responds to mouth care, otherwise mostly unresponsive, slight grimace to noxious stimuli- no stiffness or rhythmic activity CV: rr, no murmur PULM:  Non labored, CTA GI: soft, NT, +bs Extremities: warm/diaphoretic, no edema  Skin: no rashes or lesions  Net - 6.9L  Resolved Hospital Problem list   Acute hypoxemic respiratory failure Hypertensive crisis  Hypokalemia  Assessment & Plan:   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   - neurology, neurosurgery signed off  - [facility] 3/8 showed expected evolution of multifocal hemorrhage, otherwise stable - ongoing neuro  checks  - supportive care  Seizure, rule out SCSE - transferred to ICU for close airway monitoring (high risk for intubation), LTM EEG, and hypertonic saline - NPO - Neurology following - CBG 178, likely due to hyponatremia, given leukocytosis, can not rule infectious etiology, consider LP, ID following; out of window for DTs, no  recent benzo's to suspect benzo withdrawal - Keppra 1.5g load now, increase keppra to 500 mg BID  - Respiratory support as below/ above - Maintain neuro protective measures; goal for eurothermia, euglycemia, eunatermia, normoxia, and PCO2 goal of 35-40 Nutrition and bowel regiment  - Seizure precautions  - Aspirations precautions   Leukocytosis - ID following, appreciate input - CT abd/ pelvis ordered - RPR/ tppa (sendout), and HIV repeated  - PCT 0.14- 0.15- 0.73 (3/8) - consider BCx  - tylenol prn fever - trend CBC w/diff and fever curve  Acute respiratory insufficiency - at risk for intubation given encephalopathy  - supplemental O2 prn   Hypotension Hypertensive crisis- resolved - NS 1L bolus now for hypotenson - trend for MAP goal > 65 - may need to hold norvasc, coreg, hydralazine - losartan stopped as this can cause hyponatremia   Hyponatremia  - Na 127-122- 120- 123- 124- 123 - UOP 850 ml/ 24hr - likely secondary to ICH/ ?SIADH - pending serum and urine osmolality and urine Na - hypertonic saline 50 ml/hr - trend Na q 4hr - do not raise > 6-8 meq in 24hr to avoid ODS  Alcohol abuse: Alcohol level 242 on admission - continue MIVF, thiamine, folate  Transaminitis: likely secondary to alcohol - check LFTs/ INR  Hyperammonemia - recheck ammonia  - no prior history of hepatic encephaloapthy - consider lactulose   Tobacco abuse - cessation counseling when appropiate  Best practice (evaluated daily)  Diet: NPO Pain/Anxiety/Delirium protocol (if indicated): NA VAP protocol (if indicated): NA DVT prophylaxis: SCD GI prophylaxis: PPI Glucose control: NA Mobility: BR Disposition: tx to ICU  Goals of Care:  Last date of multidisciplinary goals of care discussion: 3/9 Family and staff present:  Summary of discussion: mother wishes for full care.  Follow up goals of care discussion due: 3/16 Code Status: FULL  Mother at bedside, updated on plan of care.     Labs   CBC: Recent Labs  Lab 07/15/20 0025 07/16/20 0215 07/17/20 0106 07/18/20 0348 07/19/20 0337  WBC 10.5 10.3 12.2* 19.4* 22.0*  22.6*  NEUTROABS  --   --   --   --  18.6*  HGB 11.9* 12.2* 11.7* 12.5* 11.9*  11.6*  HCT 34.0* 33.3* 32.1* 34.3* 32.5*  32.5*  MCV 98.3 96.5 96.4 96.9 96.7  96.4  PLT 292 359 368 376 426*  387    Basic Metabolic Panel: Recent Labs  Lab 07/13/20 0639 07/13/20 1637 07/14/20 1616 07/14/20 1751 07/15/20 0025 07/15/20 1412 07/16/20 0215 07/17/20 0106 07/18/20 0348 07/18/20 1148 07/18/20 1819 07/19/20 0337  NA  --    < > 129*   < > 127*   < > 130*  131* 127* 122* 120* 123* 123*  K  --    < >  --   --  3.6  --  3.5 3.3* 4.2  --   --  3.8  CL  --    < >  --   --  98  --  98 94* 90*  --   --  91*  CO2  --    < >  --   --  20*  --  22 22 20*  --   --  19*  GLUCOSE  --    < >  --   --  126*  --  123* 114* 108*  --   --  124*  BUN  --    < >  --   --  14  --  14 11 11   --   --  13  CREATININE  --    < >  --   --  0.53*  --  0.52* 0.58* 0.56*  --   --  0.54*  CALCIUM  --    < >  --   --  9.2  --  9.4 9.4 9.4  --   --  9.5  MG 1.6*  --  2.0  --   --   --  1.8 1.8 1.5*  --   --  1.8  PHOS 4.4  --   --   --   --   --   --   --   --   --   --   --    < > = values in this interval not displayed.   GFR: Estimated Creatinine Clearance: 116.8 mL/min (A) (by C-G formula based on SCr of 0.54 mg/dL (L)). Recent Labs  Lab 07/16/20 0215 07/16/20 0748 07/16/20 0947 07/16/20 1413 07/16/20 1637 07/16/20 1947 07/17/20 0106 07/18/20 0348 07/19/20 0337  PROCALCITON  --   --   --   --  0.14  --  0.15 0.73  --   WBC 10.3  --   --   --   --   --  12.2* 19.4* 22.0*  22.6*  LATICACIDVEN  --    < > 2.2* 1.6 1.3 1.0  --   --   --    < > = values in this interval not displayed.    Liver Function Tests: Recent Labs  Lab 07/16/20 0215  AST 34  ALT 43  ALKPHOS 113  BILITOT 0.7  PROT 8.0  ALBUMIN 3.0*   No results for input(s): LIPASE, AMYLASE  in the last 168 hours. Recent Labs  Lab 07/16/20 1637  AMMONIA 40*    ABG    Component Value Date/Time   PHART 7.522 (H) 07/07/2020 0406   PCO2ART 25.2 (L) 07/07/2020 0406   PO2ART 98 07/07/2020 0406   HCO3 20.7 07/07/2020 0406   TCO2 21 (L) 07/07/2020 0406   ACIDBASEDEF 1.0 07/07/2020 0406   O2SAT 98.0 07/07/2020 0406     Coagulation Profile: No results for input(s): INR, PROTIME in the last 168 hours.  Cardiac Enzymes: No results for input(s): CKTOTAL, CKMB, CKMBINDEX, TROPONINI in the last 168 hours.  HbA1C: Hgb A1c MFr Bld  Date/Time Value Ref Range Status  07/07/2020 06:16 AM 5.7 (H) 4.8 - 5.6 % Final    Comment:    (NOTE) Pre diabetes:          5.7%-6.4%  Diabetes:              >6.4%  Glycemic control for   <7.0% adults with diabetes      CRITICAL CARE Performed by: 07/09/2020   Total critical care time: 65 minutes  Critical care time was exclusive of separately billable procedures and treating other patients.  Critical care was necessary to treat or prevent imminent or life-threatening deterioration.  Critical care was time spent personally by me on the following activities: development of treatment plan with patient and/or surrogate  as well as nursing, discussions with consultants, evaluation of patient's response to treatment, examination of patient, obtaining history from patient or surrogate, ordering and performing treatments and interventions, ordering and review of laboratory studies, ordering and review of radiographic studies, pulse oximetry and re-evaluation of patient's condition.   Posey BoyerBrooke Truly Stankiewicz, ACNP [facility] 07/19/2020, 3:07 PM  See Amion for pager If no response to pager, please call [phone] until 7pm After 7:00 pm call Elink  [phone]

## 2020-07-19 NOTE — Significant Event (Signed)
Rapid Response Event Note   Reason for Call :  Patient with seizures and hyponatremia  Initial Focused Assessment:  Patient is somnolent He is breathing easily BP 89/69  SR 93  RR 22  O2 sat 100% on RA Occasionally snoring respirations, Occasionally open eyes.  He withdraws to pain.  CCM at bedside:  Charles Aguilar and Dr Gaynell Face Dr Wilford Corner at bedside  Interventions:  1L NS bolus given 1500 mg Keppra given IV 3% saline started at 50cc/hr  Transferred to 3M07 via bed ICU staff at bedside to receive patient   Plan of Care:     Event Summary:   MD Notified: MD at bedside upon my arrival Call Time: 1358 Arrival Time: 1400 End Time: 1500  Marcellina Millin, RN

## 2020-07-19 NOTE — Progress Notes (Signed)
Physical Therapy Treatment Patient Details Name: Charles Aguilar MRN: [id number] DOB: 06-28-1975 Today's Date: 07/19/2020    History of Present Illness 45 yo male presenting from home with headache, dizziness, and progressive altered mentation after multiple syncopal episodes. 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 Pt intubated after arrival (2/24) and extubated 2/26 with placement of L frontal IVC on 2/25. Seizure 3/2. 3/8, 3/9. Severe diffuse encephalopathy suggested with EEG 3/4.    PT Comments    Upon arrival, patient somnolent and following ~50% of commands. Patient required maxA+2 for supine to sit. Upon sitting, patient with seizure like activity with drawing up of B hands, stiffening, blank stare, L side of face drawn down with drooling, and inability to follow commands. Notified RN immediately. Returned to sidelying with totalA+2. Left in care of RN. Continue to recommend comprehensive inpatient rehab (CIR) for post-acute therapy needs.    Follow Up Recommendations  CIR     Equipment Recommendations  Other (comment) (defer to post acute rehab)    Recommendations for Other Services       Precautions / Restrictions Precautions Precautions: Fall Precaution Comments: seizure Restrictions Weight Bearing Restrictions: No    Mobility  Bed Mobility Overal bed mobility: Needs Assistance Bed Mobility: Supine to Sit;Sit to Sidelying     Supine to sit: Max assist;+2 for safety/equipment;+2 for physical assistance;HOB elevated   Sit to sidelying: Total assist;+2 for physical assistance;+2 for safety/equipment General bed mobility comments: maxA+2 for bringing B LEs to EOB and trunk elevation. Upon sitting, patient with seizure like activity with drawing up of B hands, stiffening, blank stare, L side of face drawn down with drooling, and inability to follow commands. TotalA+2 to return to sidelying, notified RN as she had just walking out  of room    Transfers                 General transfer comment: Deferred due to seizure like activity  Ambulation/Gait                 Stairs             Wheelchair Mobility    Modified Rankin (Stroke Patients Only) Modified Rankin (Stroke Patients Only) Pre-Morbid Rankin Score: No symptoms Modified Rankin: Severe disability     Balance Overall balance assessment: Needs assistance Sitting-balance support: Feet supported;No upper extremity supported Sitting balance-Leahy Scale: Poor Sitting balance - Comments: maxA to maintain sitting EOB Postural control: Posterior lean;Left lateral lean                                  Cognition Arousal/Alertness: Lethargic Behavior During Therapy: Flat affect Overall Cognitive Status: Impaired/Different from baseline Area of Impairment: Attention;Memory;Following commands;Safety/judgement;Awareness;Problem solving                   Current Attention Level: Sustained;Selective Memory: Decreased recall of precautions;Decreased short-term memory Following Commands: Follows one step commands with increased time;Follows one step commands inconsistently Safety/Judgement: Decreased awareness of safety;Decreased awareness of deficits Awareness: Intellectual Problem Solving: Slow processing;Decreased initiation;Requires verbal cues;Requires tactile cues;Difficulty sequencing General Comments: Patient lethargic and difficulty answering questions. Patient follows one step commands ~50% of time      Exercises      General Comments        Pertinent Vitals/Pain Pain Assessment: Faces Faces Pain Scale: Hurts little more Pain Location: R hip and buttocks Pain Descriptors / Indicators: Grimacing;Discomfort Pain  Intervention(s): Repositioned;Monitored during session    Home Living                      Prior Function            PT Goals (current goals can now be found in the care plan  section) Acute Rehab PT Goals Patient Stated Goal: none stated by pt, mother present and desires pt to improve PT Goal Formulation: With patient/family Time For Goal Achievement: 07/22/20 Potential to Achieve Goals: Fair Progress towards PT goals: Not progressing toward goals - comment    Frequency    Min 4X/week      PT Plan Current plan remains appropriate    Co-evaluation              AM-PAC PT "6 Clicks" Mobility   Outcome Measure  Help needed turning from your back to your side while in a flat bed without using bedrails?: A Lot Help needed moving from lying on your back to sitting on the side of a flat bed without using bedrails?: A Lot Help needed moving to and from a bed to a chair (including a wheelchair)?: A Lot Help needed standing up from a chair using your arms (e.g., wheelchair or bedside chair)?: A Lot Help needed to walk in hospital room?: Total Help needed climbing 3-5 steps with a railing? : Total 6 Click Score: 10    End of Session   Activity Tolerance: Treatment limited secondary to medical complications (Comment) (seizure like activity) Patient left: in bed;with call bell/phone within reach;with nursing/sitter in room;with family/visitor present Nurse Communication: Other (comment) (Seizure like activity) PT Visit Diagnosis: Unsteadiness on feet (R26.81);Other abnormalities of gait and mobility (R26.89);Muscle weakness (generalized) (M62.81);Difficulty in walking, not elsewhere classified (R26.2);Other symptoms and signs involving the nervous system (R29.898)     Time: 1300-1315 PT Time Calculation (min) (ACUTE ONLY): 15 min  Charges:  $Therapeutic Activity: 8-22 mins                     Charles Aguilar PT, DPT Acute Rehabilitation Services Pager [phone] Office [phone]    Viviann Spare 07/19/2020, 3:28 PM

## 2020-07-19 NOTE — Progress Notes (Addendum)
STROKE TEAM PROGRESS NOTE   INTERVAL HISTORY Stroke neurology was recalled to evaluate the patient. He had an episode yesterday where his mother says that he drifted away in space with his eyes open and became stiff with the left side of the face drawn down and drooling from the left corner of the mouth. It lasted a few minutes before resolution. Hospitalist was informed.  Stat CT head was done which was unremarkable for acute process.  Sodium today 123. White count in the low 20s.  ID consulted.  Getting abdomen/pelvis CT.  Also checking RPR/TP PA.  Repeating HIV serology.  Consider repeat blood cultures.  Recommend  Patient had another 15-second episode of blank stare followed by drawing of the arms and becoming stiff.  Resolved without Ativan.    Vitals:   07/19/20 0020 07/19/20 0420 07/19/20 0754 07/19/20 1211  BP: (!) 130/94 (!) 135/95 (!) 155/106 104/61  Pulse: 80 79 89 99  Resp: 17 19 20 19   Temp: 98.2 F (36.8 C) 97.6 F (36.4 C) 97.8 F (36.6 C) (!) 101.1 F (38.4 C)  TempSrc: Axillary Oral Oral Oral  SpO2: 100% 100% 100% 99%  Weight:      Height:       CBC:  Recent Labs  Lab 07/18/20 0348 07/19/20 0337  WBC 19.4* 22.0*  22.6*  NEUTROABS  --  18.6*  HGB 12.5* 11.9*  11.6*  HCT 34.3* 32.5*  32.5*  MCV 96.9 96.7  96.4  PLT 376 426*  387   Basic Metabolic Panel:  Recent Labs  Lab 07/13/20 0639 07/13/20 1637 07/18/20 0348 07/18/20 1148 07/18/20 1819 07/19/20 0337  NA  --    < > 122*   < > 123* 123*  K  --    < > 4.2  --   --  3.8  CL  --    < > 90*  --   --  91*  CO2  --    < > 20*  --   --  19*  GLUCOSE  --    < > 108*  --   --  124*  BUN  --    < > 11  --   --  13  CREATININE  --    < > 0.56*  --   --  0.54*  CALCIUM  --    < > 9.4  --   --  9.5  MG 1.6*   < > 1.5*  --   --  1.8  PHOS 4.4  --   --   --   --   --    < > = values in this interval not displayed.   Lipid Panel: No results for input(s): CHOL, TRIG, HDL, CHOLHDL, VLDL, LDLCALC in  the last 09/18/20 hours. HgbA1c: No results for input(s): HGBA1C in the last 168 hours. Urine Drug Screen: No results for input(s): LABOPIA, COCAINSCRNUR, LABBENZ, AMPHETMU, THCU, LABBARB in the last 168 hours.  Alcohol Level No results for input(s): ETH in the last 168 hours.  IMAGING past 24 hours CT HEAD WO CONTRAST  Result Date: 07/18/2020 CLINICAL DATA:  Altered mental status, intracranial hemorrhage EXAM: CT HEAD WITHOUT CONTRAST TECHNIQUE: Contiguous axial images were obtained from the base of the skull through the vertex without intravenous contrast. COMPARISON:  07/12/2020 FINDINGS: Brain: Significant decrease in hyperdense hemorrhage at the right caudothalamic groove with decreased edema. Persistent layering hyperdense hemorrhage in the left greater than right occipital horns. Persistent hyperdense hemorrhage along  the left frontal approach catheter tract with similar edema. EVD has been removed.  Caliber of the ventricles is similar. No new loss of gray-white differentiation. Stable findings of chronic microvascular ischemic changes. Vascular: No new findings. Skull: Unremarkable apart from small left burr hole. Sinuses/Orbits: No acute finding. Other: None. IMPRESSION: Expected evolution of multifocal hemorrhage. Interval removal of ventricular catheter with similar caliber of ventricles. Electronically Signed   By: Guadlupe Spanish M.D.   On: 07/18/2020 13:56   [place] EKG SITE RITE  Result Date: 07/19/2020 If Site Rite image not attached, placement could not be confirmed due to current cardiac rhythm.   PHYSICAL EXAM Blood pressure 104/61, pulse 99, temperature (!) 101.1 F (38.4 C), temperature source Oral, resp. rate 19, height 6\' 2"  (1.88 m), weight 70.1 kg, SpO2 99 %.  General: Appears mildly drowsy but is awake and following commands.  Lungs: Symmetrical Chest Rise, no labored breathing  Cardio: Regular Rate and Rhythm  Abdomen: soft, non-tender  Neurological exam Mildly drowsy but  follows commands. Unable to follow complex commands with simple commands or consistently being followed. Speech is dysarthric Extremely poor attention concentration-cannot check for aphasia in all detail. Extreme paucity of speech. Cranial nerves: Pupils equal round reactive to light, extraocular movements reveal slight right gaze preference but is able to look to the left after multiple tries.  He is able to consistently count fingers in the right visual field but on the left, difficult to assess if he is actually seen the fingers or just randomly saying yes to seeing fingers or randomly generating numbers on the number of fingers seen.  Subtle left lower facial weakness observed. Motor: He is extremely atrophied thigh and calf muscles bilaterally.  On both lower extremity he can barely go antigravity-some of the exam is also limited by pain in his thighs.  Both upper extremities are antigravity at least 4/5. Sensory exam: Grimaces to touch equally on both sides. Coordination: Does not follow/poor attention concentration precludes a good assessment.  Last neurological exam had mention of bilateral upper extremity tremors-I did not see significant upper extremity tremors today.    ASSESSMENT/PLAN  Mr. Asif Muchow is a 45 y.o. male with history of heavy alcohol, abuse, tobacco abuse , TIA presented with Headache, dizziness, vomiting and progressive decreased level of consciousness and a fall.   Stroke service recalled for concern for seizures. 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 There is new mild to moderate panventriculardilatation compared to prior exam  .  CTA head &neck - No large or medium vessel occlusion  or stenosis. No increase in size of the right basal ganglia hemorrhage. Slight enlargement in ventricular size when compared topreviousstudy.  Required EVD which was removed 07/14/2020.  There was a hematoma around the track with the left frontal approach.  Personally reviewed-last CT head from yesterday shows expected evolution with no new changes.  2D EchoEF 55-60%  Serial CTs stable hematoma and hydro  LDL79  HgbA1c5.7  VTE prophylaxis - heparin subq  No antithromboticprior to admission, now on No antithromboticdue to ICH  Therapy recommendations:CIR  Disposition:pending  Hydrocephalous  Off3% Normal Saline  Neurosurgery on board  IVC placed on 2/25  IVC obstructed on 2/27,2/28, and 3/1  EVD was removed 3/4  Neuro stable  Seizure  3/2 reported seizure in the setting of hyponatremia  3/2 EEG severe diffuse encephalopathy  3/3 LTM right thumb twitching and gaze deviation to right without concomitant EEG change  3/5 LTM moderate diffuse encephalopathy  D/c LTM EEG 07/15/20  On keppra 1000 (drowsy) ->500mg  bid  07/18/2020-seizure activity reported.  Stat CT head obtained.  No changes.  Continued on his antiepileptic  07/19/2020-recalled for assessment.  Had another 15 seconds episode concerning for seizure.  Remains hyponatremic  Stat EEG followed by LTM EEG  Load Keppra 1.5 g IV now  Increase Keppra dose to 500 twice daily  Correction of hyponatremia per primary team  Seizure precautions  Ativan as needed for seizure lasting greater than 5 minutes.  Also call neurology at that time.  Continuous EEG will be started shortly-press the red button in case of any clinical seizure  Fever   Continues to spike fevers  ID on board.  See recommendations and ID consult  Hypertension  Home meds: None  Off Cleviprex  BP goal systolic <160 mmHg -appreciate internal medicine management  Hyperlipidemia  Home meds: None.  LDL 79, goal <  70  May consider Atorvastatin 20 mg on Discharge  Smoker  Uses 3-40 cigarettes /day. Cigar use.  May offer Nicotine patch when stabalize.  Smoking cessation education will be provided.   Alcohol Use  Heavy daily alcohol/liquor use.  Continue Thiamine/MVI/ Folic acid  CIWA protocol.  BUE tremor much improved  Hyponatremia   Likely secondary to the ICH/SIADH  Will need correction-hypertonic saline  Might need transfer to ICU since neuro floors are unable to accommodate patients who require hypertonic saline.  We will appreciate CCM assistance.  Other Stroke Risk Factors  Hx of TIA  Hx of seizure - likely alcohol related??   Hospital day # 17  -- Milon Dikes, MD Stroke Neurology Pager: [phone]   CRITICAL CARE ATTESTATION Performed by: Milon Dikes, MD Total critical care time: 33 minutes Critical care time was exclusive of separately billable procedures and treating other patients and/or supervising APPs/Residents/Students Critical care was necessary to treat or prevent imminent or life-threatening deterioration due to history of ICH, concern for seizures versus status epilepticus. This patient is critically ill and at significant risk for neurological worsening and/or death and care requires constant monitoring. Critical care was time spent personally by me on the following activities: development of treatment plan with patient and/or surrogate as well as nursing, discussions with consultants, evaluation of patient's response to treatment, examination of patient, obtaining history from patient or surrogate, ordering and performing treatments and interventions, ordering and review of laboratory studies, ordering and review of radiographic studies, pulse oximetry, re-evaluation of patient's condition, participation in multidisciplinary rounds and medical decision making of high complexity in the care of this patient.

## 2020-07-19 NOTE — Progress Notes (Signed)
EEG complete - results pending 

## 2020-07-19 NOTE — Consult Note (Signed)
[facility]    Date of Admission:  Jul 08, 2020     Reason for Consult: leukocytosis    Referring Provider: Lowell Guitar     Lines:  Foley catheter  Abx: 2/25-3/04 vanc (?surgical prophy with evd)        Assessment: Leukocytosis ICH with hydrocephalus s/p evd (removed 3/04) seizure   45 y.o. male with history of heavy alcohol, abuse, tobacco abuse (3 to 40 cigarettes daily), TIA presented 2/24 with Headache, dizziness, vomiting and progressive decreased level of consciousness and a fall found to have right basal ganglia/thalamic hemorrhage with intraventricular extension requiring evd placement, course complicated by leukocytosis  She had had a couple episodes of nosocomial low grade fever with negative sepsis workup. evd was removed 1 day prior to the last episode of fever on 3/05. She received vancomycin while the evd remains in place by 3/04.   Since 3/07 has a rising leukocytosis but assymptomatic. lft is normal. No differential on cbc. There is not indwelling lines. There appears to be no steroid given. At least her seizure medication was started here.   I have low suspicion for ID cause. The fever likely have been due to the ICH. I suspect leukocytosis is reactive due to that? Other consideration drugs related vs occult abscess   For underlying cause of stroke and health care maintenance, will get syphilis testing and repeat hiv screen   Plan: 1. Get abd/pelv ct with contrast 2. Send cbc with differential for the next couple days 3. Ordered RPR, tppa (sentout), and repeat hiv serology 4. If disturbed hemodynamics with fever repeat blood culture 5. I would not send urine culture as he has no urinary sx; would remove the indwelling foley if no indication, or exchange it if he still needs 6. Monitor off antibiotics   Active Problems:   ICH (intracerebral hemorrhage) (HCC)   Encounter for intubation   Thalamic hemorrhage  (HCC)   Scheduled Meds: . amLODipine  10 mg Oral Daily  . bethanechol  10 mg Oral TID  . carvedilol  37.5 mg Oral BID  . Chlorhexidine Gluconate Cloth  6 each Topical Daily  . feeding supplement  237 mL Oral TID BM  . folic acid  1 mg Oral Daily  . heparin injection (subcutaneous)  5,000 Units Subcutaneous Q8H  . levETIRAcetam  500 mg Oral BID  . lidocaine  1 application Urethral Once  . losartan  100 mg Oral Daily  . mouth rinse  15 mL Mouth Rinse BID  . multivitamin with minerals  1 tablet Oral Daily  . senna-docusate  1 tablet Oral BID  . sodium chloride  1 g Oral BID   Continuous Infusions: . sodium chloride     PRN Meds:.Place/Maintain arterial line **AND** sodium chloride, acetaminophen **OR** acetaminophen (TYLENOL) oral liquid 160 mg/5 mL **OR** acetaminophen, bisacodyl, hydrALAZINE, labetalol, ondansetron (ZOFRAN) IV  HPI: Charles Aguilar is a 45 y.o. male etoh abuse, tobacco abuse but otherwise healthy admitted 2/24 for ICU, course complicated by leukocytosis which id consulted   Patient sleeping. Mother at bedside. Hx via mother/chart  He developed acute onset headache the day of admission, then decreased mentation, with subsequent n/v/syncope. A few days prior to admission his family did noted patient sleeping more  Patient brought in on 2/24 Head imagign showed ich He requires intubation/evd placement Course complicated by seizure although no epileptiform discharge on serial eeg  The evd removed on 3/04. Patient has overall  improved but mentation still not baseline and is not able to ambulate, requiring 2 person assist, doing pt/ot.   He is eating well He has intermittent headache and increased sedation still but otherwise is not complaining of cough, chest pain, n/v/diarrhea, flank pain, joint pain, rash otherwise  He has no other indwelling device except the foley catheter  He has had intermittent low grade fever (last episode 3/05). Sepsis w/u  negative evd removed on 3/04 Starting 3/07 has a rising leukocytosis. But again as mentioned no complaint otherwise    Review of Systems: ROS Other ros negative  Past Medical History:  Diagnosis Date  . Epilepsy [facility])     Social History   Tobacco Use  . Smoking status: Current Every Day Smoker    Types: Cigarettes, Cigars  . Smokeless tobacco: Never Used  Vaping Use  . Vaping Use: Never used  Substance Use Topics  . Alcohol use: Yes    Comment: Occasionally  . Drug use: Never    Family History  Problem Relation Age of Onset  . Diabetes Mother   . Diabetes Father   . Colon cancer Neg Hx    Allergies  Allergen Reactions  . Penicillins Shortness Of Breath and Swelling    Has patient had a PCN reaction causing immediate rash, facial/tongue/throat swelling, SOB or lightheadedness with hypotension: yes Has patient had a PCN reaction causing severe rash involving mucus membranes or skin necrosis: no Has patient had a PCN reaction that required hospitalization: unknown Has patient had a PCN reaction occurring within the last 10 years: no If all of the above answers are "NO", then may proceed with Cephalosporin use.     OBJECTIVE: Blood pressure (!) 155/106, pulse 89, temperature 97.8 F (36.6 C), temperature source Oral, resp. rate 20, height 6\' 2"  (1.88 m), weight 70.1 kg, SpO2 100 %.  Physical Exam Patient sleeping, opened eyes briefly to voice but drifted back to sleep; ate all of his breakfast per mother who is at bedside Atraumatic; no swelling/tenderness around previous evd site Neck supple cv rrr no mrg Lungs normal respiratory effort; clear abd soft/nt Ext no edema Skin no rash msk no peripheral joint swelling/increased warmth Neuro -- deferred as patient sleeping Psych -- deferred  Lab Results Lab Results  Component Value Date   WBC 22.6 (H) 07/19/2020   HGB 11.6 (L) 07/19/2020   HCT 32.5 (L) 07/19/2020   MCV 96.4 07/19/2020   PLT 387 07/19/2020     Lab Results  Component Value Date   CREATININE 0.54 (L) 07/19/2020   BUN 13 07/19/2020   NA 123 (L) 07/19/2020   K 3.8 07/19/2020   CL 91 (L) 07/19/2020   CO2 19 (L) 07/19/2020    Lab Results  Component Value Date   ALT 43 07/16/2020   AST 34 07/16/2020   ALKPHOS 113 07/16/2020   BILITOT 0.7 07/16/2020     Microbiology: Recent Results (from the past 240 hour(s))  MRSA PCR Screening     Status: None   Collection Time: 07/10/20  1:22 PM   Specimen: Nasal Mucosa; Nasopharyngeal  Result Value Ref Range Status   MRSA by PCR NEGATIVE NEGATIVE Final    Comment:        The GeneXpert MRSA Assay (FDA approved for NASAL specimens only), is one component of a comprehensive MRSA colonization surveillance program. It is not intended to diagnose MRSA infection nor to guide or monitor treatment for MRSA infections. Performed at [facility], [address] [place], [place] [address]   Culture, blood (routine x 2)     Status: None (Preliminary result)   Collection Time: 07/15/20  6:50 PM   Specimen: BLOOD LEFT FOREARM  Result Value Ref Range Status   Specimen Description BLOOD LEFT FOREARM  Final   Special Requests   Final    BOTTLES DRAWN AEROBIC AND ANAEROBIC Blood Culture results may not be optimal due to an inadequate volume of blood received in culture bottles   Culture   Final    NO GROWTH 3 DAYS Performed at [facility], [address], [place], [place] [address]    Report Status PENDING  Incomplete  Culture, blood (routine x 2)     Status: None (Preliminary result)   Collection Time: 07/15/20  7:05 PM   Specimen: BLOOD LEFT HAND  Result Value Ref Range Status   Specimen Description BLOOD LEFT HAND  Final   Special Requests   Final    BOTTLES DRAWN AEROBIC ONLY Blood Culture results may not be optimal due to an inadequate volume of blood received in culture bottles   Culture   Final    NO GROWTH 3 DAYS Performed at [facility], [address] [place], [place] [address]    Report Status PENDING  Incomplete  Culture, Urine     Status: None   Collection Time: 07/15/20  9:22 PM   Specimen: Urine, Catheterized  Result Value Ref Range Status   Specimen Description URINE, CATHETERIZED  Final   Special Requests NONE  Final   Culture   Final    NO GROWTH Performed at [facility], [address], [place], [place] [address]    Report Status 07/17/2020 FINAL  Final     Serology:   Imaging: If present, new imagings (plain films, ct scans, and mri) have been personally visualized and interpreted; radiology reports have been reviewed. Decision making incorporated into the Impression / Recommendations.  3/08 head ct Expected evolution of multifocal hemorrhage. Interval removal of ventricular catheter with similar caliber of Ventricles.  3/08 cxr Linear airspace disease in the lingula likely reflecting atelectasis. Patchy left lower lobe airspace disease similar in appearance to the prior exams likely reflecting atelectasis versus pneumonia.  2/24 head ct 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  No midline shift or ventriculomegaly.   Raymondo Band, MD [facility] [phone] pager    07/19/2020, 9:08 AM

## 2020-07-19 NOTE — Progress Notes (Addendum)
PROGRESS NOTE    Charles Aguilar  VQX:[id number] DOB: Jan 03, 1976 DOA: July 23, 2020 PCP: Patient, No Pcp Per   Chief Complaint  Patient presents with  . Altered Mental Status  . Loss of Consciousness   Brief Narrative:  [name] 45 y.o.malewith history of of heavy alcohol, abuse, tobacco abuse (3 to 40 cigarettes daily), TIA presented with Headache, dizziness, vomiting and progressive decreased level of consciousness and Sundiata Ferrick fall.  His Ct Head showed 2.0 cm right basal ganglia/thalamic hemorrhage with intraventricular extension concerning for hydrocephalus - s/p EVD placement.CTA Head and Neck showed no large medium vessel occlusion or stenosis. No increase in size of the right basal ganglia hemorrhage. Slightly more intraventricular blood particularly in Rt lateral Ventricle than was seen previously. BP wascontrolled by Claviprex drip and labetalol PRN. HbA1c 5.7, LDL 79, Echo showedEF 55-60%. PTrecommends CIR. Etiology of ICH and IVH is likely due to hypertension with risk factors of alcohol and smoking. MRI showed stable hematoma, blood pressure goal will be less than 160. CIWA protocol, avoid DT. On B1/FA/multivitamin. Mentation continues to wax and wane.  Had altered mental status on 3/8 in setting of hyponatremia c/b seizure like activity.  Suspect seizures in setting of hyponatremia.  Keppra load, hypertonic saline.  Neurology reconsulted.  Will discuss with PCCM regarding transfer to ICU.  Transferred to ICU, will sign off.  Please let us know when we can be of further assistance.  Assessment & Plan:   Active Problems:   ICH (intracerebral hemorrhage) (HCC)   Encounter for intubation   Thalamic hemorrhage (HCC)   Fever   Leukocytosis  Acute Metabolic Encephalopathy  Seizure Like Activity: in setting of hyponatremia and history of seizure on 3/2 in setting of hyponatremia.   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 EVD removed on 3/4. - CT head 2/24 with 2 cm R basal ganglia/thalamic hemorrhage with intraventricular extension - MRI 2/25 motion degraded, but with R basal ganglia/thalamic intraparenchymal hemorrhage decompressing into the ventricular system with large amount of blood within the supratentorial and infratentorial ventricular system - L frontal approach ventricular drain with tip within frontal horn of L lateral ventricle  - CT head 3/8 with expected evolution of multifocal hemorrhage -PT/OT recommended CIR - neurology reconsulted due to above - staples/sutures to scalp  Acute hypotonic hyponatremia: -suspect this is related to his intraparenchymal hemorrhage -follow urine sodium/urine osm -restart hypertonic saline today in setting of above -Continue sodium tablets 1 g p.o. twice daily and continue to monitor his sodium level closely.  Fever  Leukocytosis -unclear at this time, worsening leukocytosis in setting of fevers -possible central fever? -ID c/s, appreciate recs - CT abd/pelvis with contrast, RPR/tpppa, HIV serology - monitor off abx -CXR with linear airspace disease in lingula likely reflecting atelectasis, patchy L lower lobe airpsace disease similar in appearance to prior exams (atelectasis vs pneumonia)  -low threshold for additional w/u in setting of AMS   Hypertension: Stable this morning -Goal BP systolic less than 160 -Continue Coreg, losartan and  amlodipine.   -Continue as needed hydralazine and labetalol.  HCTZ  discontinued due to hyponatremia. Monitor blood pressure closely  Seizures: -Alcohol related versus electrolyte abnormalities? -Continue Keppra 500 mg twice daily -> load and increase dose - repeat EEG today - EEG 3/3 suggestive of cortical dysfunction in L hemisphere 2/2 underlying structural abnormality as well as severe diffuse encephalopathy (no definite epileptiform discharges) - EEG 3/2 with severe diffuse encephalopathy, nonspecific  -Continue with seizure precautions  Acute hypoxemic respiratory failure: -In the setting of poor mentation due to stroke.  Required intubation on 2/24.  Is post extubation. -Resolved.  Currently on room air.  Alcohol abuse: With tremors -Continue with CIWA protocol. -Continue B12, thiamine and multivitamins -On seizure protocol  Tobacco abuse: Counseled about cessation  Hypokalemia: Resolved  Hypomagnesemia: Magnesium 1.5.  Will replenish and repeat magnesium level tomorrow Ricci Paff.m.  Chronic constipation: -Continue Senokot.   Physical deconditioning:-PT OT recommended CIR  DVT prophylaxis: heparin Code Status: full  Family Communication: mother at bedside Disposition:   Status is: Inpatient  Remains inpatient appropriate because:Inpatient level of care appropriate due to severity of illness   Dispo: The patient is from: Home              Anticipated d/c is to: CIR              Patient currently is not medically stable to d/c.   Difficult to place patient No       Consultants:   PCCM  neurology  Procedures: Echo IMPRESSIONS    1. Left ventricular ejection fraction, by estimation, is 55 to 60%. The  left ventricle has normal function. The left ventricle has no regional  wall motion abnormalities. Left ventricular diastolic parameters were  normal.  2. Right ventricular systolic function is normal. The right ventricular  size is normal.  3. The mitral valve is normal in structure. No evidence of mitral valve   regurgitation. No evidence of mitral stenosis.  4. The aortic valve is normal in structure. Aortic valve regurgitation is  not visualized. No aortic stenosis is present.  5. The inferior vena cava is normal in size with greater than 50%  respiratory variability, suggesting right atrial pressure of 3 mmHg.   Conclusion(s)/Recommendation(s): No intracardiac source of embolism  detected on this transthoracic study. Byanca Kasper transesophageal echocardiogram is  recommended to exclude cardiac source of embolism if clinically indicated.  Antimicrobials:  Anti-infectives (From admission, onward)   Start     Dose/Rate Route Frequency Ordered Stop   07/07/20 1530  vancomycin (VANCOREADY) IVPB 1250 mg/250 mL  Status:  Discontinued        1,250 mg 166.7 mL/hr over 90 Minutes Intravenous Every 12 hours 07/07/20 1436 07/14/20 1027         Subjective: Confused, does not interact like yesterday Knew mother and where he was yesterday - per discussion with mom Today only minimally interactive  Objective: Vitals:   07/19/20 0020 07/19/20 0420 07/19/20 0754 07/19/20 1211  BP: (!) 130/94 (!) 135/95 (!) 155/106 104/61  Pulse: 80 79 89 99  Resp: 17 19 20 19   Temp: 98.2 F (36.8 C) 97.6 F (36.4 C) 97.8 F (36.6 C) (!) 101.1 F (38.4 C)  TempSrc: Axillary Oral Oral Oral  SpO2: 100% 100% 100% 99%  Weight:      Height:        Intake/Output Summary (Last 24 hours) at 07/19/2020 1313 Last data filed at 07/19/2020 1100 Gross per 24 hour  Intake 360 ml  Output 1000 ml  Net -640 ml  Filed Weights   07/07/20 0731 07/08/20 0400  Weight: 69.9 kg 70.1 kg    Examination:  General exam: Appears calm and comfortable  Respiratory system: Clear to auscultation. Respiratory effort normal. Cardiovascular system: S1 & S2 heard, RRR.  Gastrointestinal system: Abdomen is nondistended, soft and nontender. Central nervous system: Confused, does not consistently follow commands. Moving all extremities, does  better job following commands to upper.  Extremities: no LEE Skin: No rashes, lesions or ulcers     Data Reviewed: I have personally reviewed following labs and imaging studies  CBC: Recent Labs  Lab 07/15/20 0025 07/16/20 0215 07/17/20 0106 07/18/20 0348 07/19/20 0337  WBC 10.5 10.3 12.2* 19.4* 22.0*  22.6*  NEUTROABS  --   --   --   --  18.6*  HGB 11.9* 12.2* 11.7* 12.5* 11.9*  11.6*  HCT 34.0* 33.3* 32.1* 34.3* 32.5*  32.5*  MCV 98.3 96.5 96.4 96.9 96.7  96.4  PLT 292 359 368 376 426*  387    Basic Metabolic Panel: Recent Labs  Lab 07/13/20 0639 07/13/20 1637 07/14/20 1616 07/14/20 1751 07/15/20 0025 07/15/20 1412 07/16/20 0215 07/17/20 0106 07/18/20 0348 07/18/20 1148 07/18/20 1819 07/19/20 0337  NA  --    < > 129*   < > 127*   < > 130*  131* 127* 122* 120* 123* 123*  K  --    < >  --   --  3.6  --  3.5 3.3* 4.2  --   --  3.8  CL  --    < >  --   --  98  --  98 94* 90*  --   --  91*  CO2  --    < >  --   --  20*  --  22 22 20*  --   --  19*  GLUCOSE  --    < >  --   --  126*  --  123* 114* 108*  --   --  124*  BUN  --    < >  --   --  14  --  --   --  13  CREATININE  --    < >  --   --  0.53*  --  0.52* 0.58* 0.56*  --   --  0.54*  CALCIUM  --    < >  --   --  9.2  --  9.4 9.4 9.4  --   --  9.5  MG 1.6*  --  2.0  --   --   --  1.8 1.8 1.5*  --   --  1.8  PHOS 4.4  --   --   --   --   --   --   --   --   --   --   --    < > = values in this interval not displayed.    GFR: Estimated Creatinine Clearance: 116.8 mL/min (Marliyah Reid) (by C-G formula based on SCr of 0.54 mg/dL (L)).  Liver Function Tests: Recent Labs  Lab 07/16/20 0215  AST 34  ALT 43  ALKPHOS 113  BILITOT 0.7  PROT 8.0  ALBUMIN 3.0*    CBG: No results for input(s): GLUCAP in the last 168 hours.   Recent Results (from the past 240 hour(s))  MRSA PCR Screening     Status: None   Collection Time: 07/10/20  1:22 PM   Specimen:  Nasal Mucosa; Nasopharyngeal  Result Value Ref  Range Status   MRSA by PCR NEGATIVE NEGATIVE Final    Comment:        The GeneXpert MRSA Assay (FDA approved for NASAL specimens only), is one component of Keyuna Cuthrell comprehensive MRSA colonization surveillance program. It is not intended to diagnose MRSA infection nor to guide or monitor treatment for MRSA infections. Performed at [facility], [address] [place], [place] [address]   Culture, blood (routine x 2)     Status: None (Preliminary result)   Collection Time: 07/15/20  6:50 PM   Specimen: BLOOD LEFT FOREARM  Result Value Ref Range Status   Specimen Description BLOOD LEFT FOREARM  Final   Special Requests   Final    BOTTLES DRAWN AEROBIC AND ANAEROBIC Blood Culture results may not be optimal due to an inadequate volume of blood received in culture bottles   Culture   Final    NO GROWTH 4 DAYS Performed at [facility], [address] [place], [place] [address]    Report Status PENDING  Incomplete  Culture, blood (routine x 2)     Status: None (Preliminary result)   Collection Time: 07/15/20  7:05 PM   Specimen: BLOOD LEFT HAND  Result Value Ref Range Status   Specimen Description BLOOD LEFT HAND  Final   Special Requests   Final    BOTTLES DRAWN AEROBIC ONLY Blood Culture results may not be optimal due to an inadequate volume of blood received in culture bottles   Culture   Final    NO GROWTH 4 DAYS Performed at [facility], [address] [place], [place] [address]    Report Status PENDING  Incomplete  Culture, Urine     Status: None   Collection Time: 07/15/20  9:22 PM   Specimen: Urine, Catheterized  Result Value Ref Range Status   Specimen Description URINE, CATHETERIZED  Final   Special Requests NONE  Final   Culture   Final    NO GROWTH Performed at [facility], [address] [place], [place] [address]    Report Status 07/17/2020 FINAL  Final         Radiology Studies: CT HEAD WO CONTRAST  Result Date:  07/18/2020 CLINICAL DATA:  Altered mental status, intracranial hemorrhage EXAM: CT HEAD WITHOUT CONTRAST TECHNIQUE: Contiguous axial images were obtained from the base of the skull through the vertex without intravenous contrast. COMPARISON:  07/12/2020 FINDINGS: Brain: Significant decrease in hyperdense hemorrhage at the right caudothalamic groove with decreased edema. Persistent layering hyperdense hemorrhage in the left greater than right occipital horns. Persistent hyperdense hemorrhage along the left frontal approach catheter tract with similar edema. EVD has been removed.  Caliber of the ventricles is similar. No new loss of gray-white differentiation. Stable findings of chronic microvascular ischemic changes. Vascular: No new findings. Skull: Unremarkable apart from small left burr hole. Sinuses/Orbits: No acute finding. Other: None. IMPRESSION: Expected evolution of multifocal hemorrhage. Interval removal of ventricular catheter with similar caliber of ventricles. Electronically Signed   By: Guadlupe SpanishPraneil  Patel M.D.   On: 07/18/2020 13:56   DG CHEST PORT 1 VIEW  Result Date: 07/18/2020 CLINICAL DATA:  Congestion EXAM: PORTABLE CHEST 1 VIEW COMPARISON:  07/15/2020 FINDINGS: Linear airspace disease in the lingula likely reflecting atelectasis. Patchy left lower lobe airspace disease similar in appearance to the prior exams likely reflecting atelectasis versus pneumonia. No pleural effusion or pneumothorax. Heart and mediastinal contours  are unremarkable. No acute osseous abnormality. IMPRESSION: 1. Linear airspace disease in the lingula likely reflecting atelectasis. Patchy left lower lobe airspace disease similar in appearance to the prior exams likely reflecting atelectasis versus pneumonia. Electronically Signed   By: Elige Ko   On: 07/18/2020 13:01   [place] EKG SITE RITE  Result Date: 07/19/2020 If Site Rite image not attached, placement could not be confirmed due to current cardiac  rhythm.       Scheduled Meds: . iohexol      . amLODipine  10 mg Oral Daily  . bethanechol  10 mg Oral TID  . carvedilol  37.5 mg Oral BID  . Chlorhexidine Gluconate Cloth  6 each Topical Daily  . feeding supplement  237 mL Oral TID BM  . folic acid  1 mg Oral Daily  . heparin injection (subcutaneous)  5,000 Units Subcutaneous Q8H  . levETIRAcetam  750 mg Oral BID  . lidocaine  1 application Urethral Once  . losartan  100 mg Oral Daily  . mouth rinse  15 mL Mouth Rinse BID  . multivitamin with minerals  1 tablet Oral Daily  . senna-docusate  1 tablet Oral BID  . sodium chloride  1 g Oral BID   Continuous Infusions: . sodium chloride    . levETIRAcetam    . sodium chloride (hypertonic)       LOS: 13 days    Time spent: over 30 min    Lacretia Nicks, MD [facility]   To contact the attending provider between 7A-7P or the covering provider during after hours 7P-7A, please log into the web site [email or web address] and access using universal [place] password for that web site. If you do not have the password, please call the hospital operator.  07/19/2020, 1:13 PM

## 2020-07-19 NOTE — Progress Notes (Signed)
LTM EEG hooked up and running - no initial skin breakdown - push button tested - neuro notified.  

## 2020-07-19 NOTE — Progress Notes (Signed)
Nutrition Follow-up  DOCUMENTATION CODES:   Not applicable  INTERVENTION:   Continue Ensure Enlive po TID, each supplement provides 350 kcal and 20 grams of protein  Continue encourage PO intake at meals    NUTRITION DIAGNOSIS:   Inadequate oral intake related to lethargy/confusion as evidenced by meal completion < 50%. Ongoing.   GOAL:   Patient will meet greater than or equal to 90% of their needs Progressing.   MONITOR:   Diet advancement,Supplement acceptance,PO intake  REASON FOR ASSESSMENT:   Consult,Ventilator Enteral/tube feeding initiation and management  ASSESSMENT:   Pt with PMH of ETOH abuse admitted with ICH.    2/25 s/p EVD 2/26 extubated  2/28 diet advanced to full liquids 3/4 diet advanced to dysphagia 1 with thin liquids; EVD removed 3/7 diet advanced to dysphagia 2 with thin liquids  Pt continues to have poor mentation; CT head pending. Plan for CIR admission once pt medically stable/bed available.  PO Intake: 0-100% x last 8 recorded meals (71% average meal intake). Pt has been receiving Ensure TID and consuming well per RN. Continue with current nutrition plan of care.   UOP: x24 hours  Medications reviewed and include: folic acid, MVI, senokot-s, sodium chloride  Labs reviewed: Na 123 (L)    Diet Order:   Diet Order            DIET DYS 2 Room service appropriate? No; Fluid consistency: Thin  Diet effective now                 EDUCATION NEEDS:   No education needs have been identified at this time  Skin:  Skin Assessment: Reviewed RN Assessment  Last BM:  3/6 type 4  Height:   Ht Readings from Last 1 Encounters:  07/07/20 6\' 2"  (1.88 m)    Weight:   Wt Readings from Last 1 Encounters:  07/08/20 70.1 kg   BMI:  Body mass index is 19.84 kg/m.  Estimated Nutritional Needs:   Kcal:  2000-2200  Protein:  105-115 grams  Fluid:  >2 L/day  07/10/20, MS, RD, LDN RD pager number and weekend/on-call  pager number located in Amion.

## 2020-07-20 ENCOUNTER — Inpatient Hospital Stay (HOSPITAL_COMMUNITY): Payer: Medicaid Other

## 2020-07-20 DIAGNOSIS — R092 Respiratory arrest: Secondary | ICD-10-CM | POA: Diagnosis present

## 2020-07-20 DIAGNOSIS — J9601 Acute respiratory failure with hypoxia: Secondary | ICD-10-CM | POA: Diagnosis not present

## 2020-07-20 DIAGNOSIS — I469 Cardiac arrest, cause unspecified: Secondary | ICD-10-CM

## 2020-07-20 LAB — POCT I-STAT 7, (LYTES, BLD GAS, ICA,H+H)
Acid-Base Excess: 0 mmol/L (ref 0.0–2.0)
Acid-base deficit: 1 mmol/L (ref 0.0–2.0)
Bicarbonate: 26 mmol/L (ref 20.0–28.0)
Bicarbonate: 24.3 mmol/L (ref 20.0–28.0)
Calcium, Ion: 1.19 mmol/L (ref 1.15–1.40)
Calcium, Ion: 1.18 mmol/L (ref 1.15–1.40)
HCT: 23 % — ABNORMAL LOW (ref 39.0–52.0)
HCT: 22 % — ABNORMAL LOW (ref 39.0–52.0)
Hemoglobin: 7.8 g/dL — ABNORMAL LOW (ref 13.0–17.0)
Hemoglobin: 7.5 g/dL — ABNORMAL LOW (ref 13.0–17.0)
O2 Saturation: 99 %
O2 Saturation: 100 %
Patient temperature: 97.8
Patient temperature: 94
Potassium: 3.6 mmol/L (ref 3.5–5.1)
Potassium: 3.4 mmol/L — ABNORMAL LOW (ref 3.5–5.1)
Sodium: 133 mmol/L — ABNORMAL LOW (ref 135–145)
Sodium: 133 mmol/L — ABNORMAL LOW (ref 135–145)
TCO2: 28 mmol/L (ref 22–32)
TCO2: 26 mmol/L (ref 22–32)
pCO2 arterial: 48.9 mmHg — ABNORMAL HIGH (ref 32.0–48.0)
pCO2 arterial: 39.3 mmHg (ref 32.0–48.0)
pH, Arterial: 7.332 — ABNORMAL LOW (ref 7.350–7.450)
pH, Arterial: 7.388 (ref 7.350–7.450)
pO2, Arterial: 153 mmHg — ABNORMAL HIGH (ref 83.0–108.0)
pO2, Arterial: 513 mmHg — ABNORMAL HIGH (ref 83.0–108.0)

## 2020-07-20 LAB — CULTURE, BLOOD (ROUTINE X 2)
Culture: NO GROWTH
Culture: NO GROWTH

## 2020-07-20 LAB — COMPREHENSIVE METABOLIC PANEL
ALT: 53 U/L — ABNORMAL HIGH (ref 0–44)
AST: 52 U/L — ABNORMAL HIGH (ref 15–41)
Albumin: 2.3 g/dL — ABNORMAL LOW (ref 3.5–5.0)
Alkaline Phosphatase: 167 U/L — ABNORMAL HIGH (ref 38–126)
Anion gap: 14 (ref 5–15)
BUN: 20 mg/dL (ref 6–20)
CO2: 13 mmol/L — ABNORMAL LOW (ref 22–32)
Calcium: 8.5 mg/dL — ABNORMAL LOW (ref 8.9–10.3)
Chloride: 101 mmol/L (ref 98–111)
Creatinine, Ser: 1.11 mg/dL (ref 0.61–1.24)
GFR, Estimated: >60 mL/min (ref >60)
Glucose, Bld: 154 mg/dL — ABNORMAL HIGH (ref 70–99)
Potassium: 4.5 mmol/L (ref 3.5–5.1)
Sodium: 128 mmol/L — ABNORMAL LOW (ref 135–145)
Total Bilirubin: 1.1 mg/dL (ref 0.3–1.2)
Total Protein: 6.5 g/dL (ref 6.5–8.1)

## 2020-07-20 LAB — CBC WITH DIFFERENTIAL/PLATELET
Abs Immature Granulocytes: 0.06 10*3/uL (ref 0.00–0.07)
Basophils Absolute: 0 10*3/uL (ref 0.0–0.1)
Basophils Relative: 0 %
Eosinophils Absolute: 0 10*3/uL (ref 0.0–0.5)
Eosinophils Relative: 0 %
HCT: 27.2 % — ABNORMAL LOW (ref 39.0–52.0)
Hemoglobin: 9.4 g/dL — ABNORMAL LOW (ref 13.0–17.0)
Immature Granulocytes: 1 %
Lymphocytes Relative: 7 %
Lymphs Abs: 0.5 10*3/uL — ABNORMAL LOW (ref 0.7–4.0)
MCH: 35.2 pg — ABNORMAL HIGH (ref 26.0–34.0)
MCHC: 34.6 g/dL (ref 30.0–36.0)
MCV: 101.9 fL — ABNORMAL HIGH (ref 80.0–100.0)
Monocytes Absolute: 0.1 10*3/uL (ref 0.1–1.0)
Monocytes Relative: 1 %
Neutro Abs: 6.6 10*3/uL (ref 1.7–7.7)
Neutrophils Relative %: 91 %
Platelets: 238 10*3/uL (ref 150–400)
RBC: 2.67 MIL/uL — ABNORMAL LOW (ref 4.22–5.81)
RDW: 11.7 % (ref 11.5–15.5)
WBC: 7.3 10*3/uL (ref 4.0–10.5)
nRBC: 0 % (ref 0.0–0.2)

## 2020-07-20 LAB — GLUCOSE, CAPILLARY
Glucose-Capillary: 120 mg/dL — ABNORMAL HIGH (ref 70–99)
Glucose-Capillary: 130 mg/dL — ABNORMAL HIGH (ref 70–99)

## 2020-07-20 LAB — RPR: RPR Ser Ql: NONREACTIVE

## 2020-07-20 MED ORDER — EPINEPHRINE HCL 5 MG/250ML IV SOLN IN NS
0.5000 ug/min | INTRAVENOUS | Status: DC
  Administered 2020-07-20: 20 ug/min via INTRAVENOUS
  Filled 2020-07-20: qty 250

## 2020-07-20 MED ORDER — SODIUM BICARBONATE 8.4 % IV SOLN
INTRAVENOUS | Status: AC
  Administered 2020-07-20: 50 meq
  Filled 2020-07-20: qty 150

## 2020-07-20 MED ORDER — SENNOSIDES-DOCUSATE SODIUM 8.6-50 MG PO TABS: 1.0000 | ORAL_TABLET | Freq: Two times a day (BID) | ORAL | Status: DC

## 2020-07-20 MED ORDER — SODIUM CHLORIDE 0.9% FLUSH: 10.0000 mL | INTRAVENOUS | Status: DC | PRN

## 2020-07-20 MED ORDER — MIDAZOLAM HCL 2 MG/2ML IJ SOLN: 2.0000 mg | Freq: Once | INTRAMUSCULAR | Status: AC

## 2020-07-20 MED ORDER — SODIUM CHLORIDE 0.9 % IV SOLN
750.0000 mg | Freq: Two times a day (BID) | INTRAVENOUS | Status: DC
  Administered 2020-07-20 (×2): 750 mg via INTRAVENOUS
  Filled 2020-07-20 (×4): qty 7.5

## 2020-07-20 MED ORDER — EPINEPHRINE PF 1 MG/ML IJ SOLN: 0.5000 ug/min | INTRAVENOUS | Status: DC

## 2020-07-20 MED ORDER — GLUCAGON HCL RDNA (DIAGNOSTIC) 1 MG IJ SOLR
INTRAMUSCULAR | Status: AC
  Filled 2020-07-20: qty 1

## 2020-07-20 MED ORDER — PHENYLEPHRINE HCL-NACL 10-0.9 MG/250ML-% IV SOLN
INTRAVENOUS | Status: AC
  Administered 2020-07-20: 400 ug/min
  Filled 2020-07-20: qty 250

## 2020-07-20 MED ORDER — PHENYLEPHRINE HCL-NACL 10-0.9 MG/250ML-% IV SOLN
0.0000 ug/min | INTRAVENOUS | Status: DC
  Administered 2020-07-20: 400 ug/min via INTRAVENOUS
  Filled 2020-07-20 (×2): qty 250

## 2020-07-20 MED ORDER — CHLORHEXIDINE GLUCONATE 0.12% ORAL RINSE (MEDLINE KIT)
15.0000 mL | Freq: Two times a day (BID) | OROMUCOSAL | Status: DC
  Administered 2020-07-20 – 2020-07-21 (×3): 15 mL via OROMUCOSAL

## 2020-07-20 MED ORDER — PHENYLEPHRINE CONCENTRATED 100MG/250ML (0.4 MG/ML) INFUSION SIMPLE
0.0000 ug/min | INTRAVENOUS | Status: DC
  Administered 2020-07-20 – 2020-07-21 (×9): 400 ug/min via INTRAVENOUS
  Filled 2020-07-20 (×9): qty 250

## 2020-07-20 MED ORDER — EPINEPHRINE PF 1 MG/ML IJ SOLN
0.5000 ug/min | INTRAVENOUS | Status: DC
  Administered 2020-07-20 – 2020-07-21 (×5): 20 ug/min via INTRAVENOUS
  Filled 2020-07-20 (×7): qty 8

## 2020-07-20 MED ORDER — FOLIC ACID 1 MG PO TABS: 1.0000 mg | ORAL_TABLET | Freq: Every day | ORAL | Status: DC

## 2020-07-20 MED ORDER — VASOPRESSIN 20 UNITS/100 ML INFUSION FOR SHOCK
0.0000 [IU]/min | INTRAVENOUS | Status: DC
  Administered 2020-07-20 – 2020-07-21 (×5): 0.04 [IU]/min via INTRAVENOUS
  Filled 2020-07-20: qty 100
  Filled 2020-07-20: qty 200
  Filled 2020-07-20: qty 100

## 2020-07-20 MED ORDER — ORAL CARE MOUTH RINSE
15.0000 mL | OROMUCOSAL | Status: DC
  Administered 2020-07-20 – 2020-07-21 (×9): 15 mL via OROMUCOSAL

## 2020-07-20 MED ORDER — NOREPINEPHRINE 4 MG/250ML-% IV SOLN
0.0000 ug/min | INTRAVENOUS | Status: DC
Start: 2020-07-20 — End: 2020-07-20
  Administered 2020-07-20: 60 ug/min via INTRAVENOUS
  Filled 2020-07-20 (×2): qty 250

## 2020-07-20 MED ORDER — VASOPRESSIN 20 UNITS/100 ML INFUSION FOR SHOCK
INTRAVENOUS | Status: AC
  Filled 2020-07-20: qty 100

## 2020-07-20 MED ORDER — SODIUM CHLORIDE 0.9% FLUSH: 10.0000 mL | Freq: Two times a day (BID) | INTRAVENOUS | Status: DC

## 2020-07-20 MED ORDER — MIDAZOLAM HCL 2 MG/2ML IJ SOLN
INTRAMUSCULAR | Status: AC
  Administered 2020-07-20: 2 mg
  Filled 2020-07-20: qty 2

## 2020-07-20 MED ORDER — SODIUM CHLORIDE 0.9 % IV SOLN: INTRAVENOUS | Status: DC

## 2020-07-20 MED ORDER — NOREPINEPHRINE 16 MG/250ML-% IV SOLN
0.0000 ug/min | INTRAVENOUS | Status: DC
  Administered 2020-07-20 – 2020-07-21 (×8): 60 ug/min via INTRAVENOUS
  Filled 2020-07-20 (×6): qty 250
  Filled 2020-07-20: qty 500

## 2020-07-20 MED ORDER — SODIUM BICARBONATE 8.4 % IV SOLN: 50.0000 meq | Freq: Once | INTRAVENOUS | Status: AC

## 2020-07-20 MED ORDER — GLUCAGON HCL RDNA (DIAGNOSTIC) 1 MG IJ SOLR
5.0000 mg | Freq: Once | INTRAMUSCULAR | Status: AC
  Administered 2020-07-20: 5 mg via INTRAVENOUS

## 2020-07-20 MED ORDER — PROPOFOL 1000 MG/100ML IV EMUL
5.0000 ug/kg/min | INTRAVENOUS | Status: DC
  Administered 2020-07-20: 5 ug/kg/min via INTRAVENOUS
  Administered 2020-07-20: 10 ug/kg/min via INTRAVENOUS
  Administered 2020-07-21: 20 ug/kg/min via INTRAVENOUS
  Filled 2020-07-20 (×2): qty 100

## 2020-07-20 NOTE — Progress Notes (Signed)
SLP Cancellation Note  Patient Details Name: Bartolo Montanye MRN: [id number] DOB: 12-10-1975   Cancelled treatment/Discharge:        Events of last night noted. Pt intubated. Our service has been discontinued.    [name] 07/20/2020, 9:36 AM

## 2020-07-20 NOTE — Progress Notes (Signed)
NAME:  Charles Aguilar, MRN:  [id number], DOB:  [birth year], LOS: 14 ADMISSION DATE:  06/28/2020, CONSULTATION DATE:  2/24 REFERRING MD:  Dr. Iver Nestle, CHIEF COMPLAINT:  ICH   Brief History:  45 year old male smoker presenting with headache and AMS found to have right basal ganglia/ thalamic ICH with IVH extension and hypertensive emergency. Transferred to [facility] on 3/5.  Called back 3/9 with seizure and hyponatremia.    Past Medical History:  ETOH, Seizures  Significant Hospital Events:  2/24 admit for ICH/ intubated  2/25 EVD placed 2/27, 2/28, 3/1 EVD obstructed; remains on vanc with EVD 3/2 reported seizure, EEG with diffuse encephalopathy  3/4 EVD removed 3/5 LTM EEG removed; transferred out ICU to [facility] 3/7 rising leukocytosis, but clinically asymptomatic  3/8 reported brief seizure, worsening hyyponatremia; CTH unchanged 3/9 PEA arrest with ROSC after 25 minutes 3/10 family discussion >> no CPR/defibrillation  Consults:  Neurology Neurosurgery ID   Procedures:  EVD 2/25- 3/4 ETT 3/10 >  Rt IJ CVL 3/10 > Rt radial a line 3/10 >   Significant Diagnostic Tests:   CT head 2/24 >> 2 cm Rt BG/thalamic ICH with intraventricular extension  MRI brain 2/25 >> Rt BG/thalamic ICH, Lt frontal drain into ventricul  Echo 2/25 >> EF 55 to 60%  CT head 3/08 >> expected evolution of ICH  Micro Data:  2/24 SARS2/ flu >> neg 2/28 MRSA >> neg 3/5 UCx>> neg 3/5 BCx >>   Antimicrobials:  vanc 2/25 >> 3/3  (for EVD)  Interim History / Subjective:  Events of last night noted.  On multiple pressors.  Hasn't received any sedation yet.  Objective   Blood pressure (!) 89/60, pulse 84, temperature (!) 96 F (35.6 C), temperature source Axillary, resp. rate (!) 21, height 6\' 2"  (1.88 m), weight 66.5 kg, SpO2 100 %.    Vent Mode: PRVC FiO2 (%):  [40 %-100 %] 40 % Set Rate:  [12 bmp] 12 bmp Vt Set:  [650 mL] 650 mL PEEP:  [5 cmH20-10 cmH20] 5 cmH20 Plateau Pressure:  [12 cmH20] 12  cmH20   Intake/Output Summary (Last 24 hours) at 07/20/2020 09/19/2020 Last data filed at 07/20/2020 0700 Gross per 24 hour  Intake 2776.38 ml  Output 170 ml  Net 2606.38 ml   Filed Weights   07/07/20 0731 07/08/20 0400 07/19/20 1530  Weight: 69.9 kg 70.1 kg 66.5 kg    General - unresponsive Eyes - pupils reactive ENT - ETT in place Cardiac - regular rate/rhythm, no murmur Chest - scattered rhonchi Abdomen - no bowel sounds Extremities - decreased muscle bulk Skin - no rashes Neuro - breaths over set rate on vent   Resolved Hospital Problem list   Hypertensive crisis  Assessment & Plan:   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 Rt BG/thalamic ICH complicated by hydrocephalus in setting of HTN. Recurrent seizure 3/09 in setting of hyponatremia. Hx of ETOH abuse. - concern at this point is significant anoxic injury after cardiac arrest - too unstable at present to go for additional neuro imaging - neurology following, and will review EEG  Acute hypoxic respiratory failure with compromised airway after PEA arrest. - full vent support  Cardiogenic shock after PEA arrest 3/10. - continue pressors to keep MAP > 65 - further assessment will be deferred until we have further neuro prognosticaton  Hyponatremia. - improved - NS IV fluid at 50 ml/hr  Anemia of critical illness and chronic disease. - f/u CBC  Best practice (  evaluated daily)  Diet: NPO DVT prophylaxis: SCD GI prophylaxis: protonix Mobility: BR Disposition: ICU Code Status: no CPR, no defibrillation  Updated family at bedside  D/w Dr. Wilford Corner   Labs    CMP Latest Ref Rng & Units 07/20/2020 07/20/2020 07/20/2020  Glucose 70 - 99 mg/dL - - 329(J)  BUN 6 - 20 mg/dL - - 20  Creatinine 1.88 - 1.24 mg/dL - - 4.16  Sodium 606 - 145 mmol/L 133(L) 133(L) 128(L)  Potassium 3.5 - 5.1 mmol/L 3.4(L) 3.6 4.5  Chloride 98 - 111 mmol/L - - 101  CO2 22 - 32 mmol/L - - 13(L)  Calcium 8.9 - 10.3 mg/dL -  - 8.5(L)  Total Protein 6.5 - 8.1 g/dL - - 6.5  Total Bilirubin 0.3 - 1.2 mg/dL - - 1.1  Alkaline Phos 38 - 126 U/L - - 167(H)  AST 15 - 41 U/L - - 52(H)  ALT 0 - 44 U/L - - 53(H)    CBC Latest Ref Rng & Units 07/20/2020 07/20/2020 07/20/2020  WBC 4.0 - 10.5 K/uL - - 7.3  Hemoglobin 13.0 - 17.0 g/dL 7.5(L) 7.8(L) 9.4(L)  Hematocrit 39.0 - 52.0 % 22.0(L) 23.0(L) 27.2(L)  Platelets 150 - 400 K/uL - - 238    ABG    Component Value Date/Time   PHART 7.388 07/20/2020 0223   PCO2ART 39.3 07/20/2020 0223   PO2ART 513 (H) 07/20/2020 0223   HCO3 24.3 07/20/2020 0223   TCO2 26 07/20/2020 0223   ACIDBASEDEF 1.0 07/20/2020 0223   O2SAT 100.0 07/20/2020 0223    CBG (last 3)  Recent Labs    07/19/20 2334 07/20/20 0219 07/20/20 0319  GLUCAP 123* 120* 130*    Critical care time: 36 minutes  Coralyn Helling, MD [place] [facility] Pager - [phone] 07/20/2020, 8:41 AM

## 2020-07-20 NOTE — Procedures (Signed)
Intubation Procedure Note  Charles Aguilar  [id number]  [birth year]  Date:07/20/20  Time:2:10 AM   Provider Performing:Katalina Judie Petit Janyth Contes    Procedure: Intubation (31500)  Indication(s) Respiratory Failure  Consent Unable to obtain consent due to emergent nature of procedure.   Anesthesia No anesthesia   Time Out Verified patient identification, verified procedure, site/side was marked, verified correct patient position, special equipment/implants available, medications/allergies/relevant history reviewed, required imaging and test results available.   Sterile Technique Usual hand hygeine, masks, and gloves were used   Procedure Description Patient positioned in bed supine.  Sedation given as noted above.  Patient was intubated with endotracheal tube using Glidescope.  View was Grade 1 full glottis .  Number of attempts was 1.  Colorimetric CO2 detector was consistent with tracheal placement.   Complications/Tolerance None; patient tolerated the procedure well. Chest X-ray is ordered to verify placement.   EBL None.   Specimen(s) None

## 2020-07-20 NOTE — Procedures (Signed)
Patient Name: Charles Aguilar  MRN: [id number]  Epilepsy Attending: Charlsie Quest  Referring Physician/Provider: Dr Lacretia Nicks Date: 07/19/2020  Duration: 24.20 mins  Patient history: 45yo M with right ICH. EEG to evaluate for seizure  Level of alertness: Asleep  AEDs during EEG study: LEV  Technical aspects: This EEG study was done with scalp electrodes positioned according to the 10-20 International system of electrode placement. Electrical activity was acquired at a sampling rate of 500Hz  and reviewed with a high frequency filter of 70Hz  and a low frequency filter of 1Hz . EEG data were recorded continuously and digitally stored.   Description: Sleep was characterized by sleep spindles (12-14Hz ), maximal frontocentral region. EEG showed continuous generalized 2-3 Hz delta slowing. Hyperventilation and photic stimulation were not performed.    ABNORMALITY -Continuous slow, generalized  IMPRESSION: This study is suggestive of severe diffuse encephalopathy, nonspecific etiology. No seizures or definite  epileptiform discharges were seen throughout the recording.  Cyara Devoto 

## 2020-07-20 NOTE — Procedures (Signed)
Arterial Catheter Insertion Procedure Note  [name]  [id number]  31-May-1975  Date:07/20/20  Time:2:12 AM    Provider Performing: Tobey Grim    Procedure: Insertion of Arterial Line (20802) with US guidance (23361)   Indication(s) Blood pressure monitoring and/or need for frequent ABGs  Consent Unable to obtain consent due to emergent nature of procedure.  Anesthesia None   Time Out Verified patient identification, verified procedure, site/side was marked, verified correct patient position, special equipment/implants available, medications/allergies/relevant history reviewed, required imaging and test results available.   Sterile Technique Maximal sterile technique including full sterile barrier drape, hand hygiene, sterile gown, sterile gloves, mask, hair covering, sterile ultrasound probe cover (if used).   Procedure Description Area of catheter insertion was cleaned with chlorhexidine and draped in sterile fashion. With real-time ultrasound guidance an arterial catheter was placed into the right radial artery.  Appropriate arterial tracings confirmed on monitor.     Complications/Tolerance None; patient tolerated the procedure well.   EBL Minimal   Specimen(s) None

## 2020-07-20 NOTE — Progress Notes (Signed)
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 evd was removed 1 day prior to the last episode of fever on 3/05. She received vancomycin while the evd remains in place by 3/04.   Since 3/07 has a rising leukocytosis but assymptomatic. lft is normal. No differential on cbc. There is not indwelling lines. There appears to be no steroid given. At least her seizure medication was started here.   I have low suspicion for ID cause. The fever likely have been due to the ICH. I suspect leukocytosis is reactive due to that? Other consideration drugs related vs occult abscess   For underlying cause of stroke and health care maintenance, will get syphilis testing and repeat hiv screen  ----------- 3/10 assessment Patient had pea arrest 3/10 early am, needing 25 minutes before rosc and there is high concern for anoxic brain injury. He is on multiple pressors now (in setting intubation and recent pea) but leukocytosis had resolved. There was a fever prior to the pea, in the setting of known ICH  cxr is clear  Current process rather atypical for a nosocomial infectious process  Ongoing goals of care discussion leaning toward comfort   Plan: 1. If goals of care congruent, could add empiric abx  vanc/cefepime and repeat blood cx  2. Await ongoing GOC discussion 3. pending RPR, tppa (sentout), and repeat hiv serology 4. abd ct cancelled due to recent events  Active Problems:   ICH (intracerebral hemorrhage) (HCC)   Thalamic hemorrhage (HCC)   Fever   Leukocytosis   Respiratory arrest (HCC)   Acute respiratory failure with hypoxia (HCC)   Scheduled Meds: . Chlorhexidine Gluconate Cloth  6 each Topical Daily  . folic acid  1 mg Per Tube Daily  . heparin injection (subcutaneous)  5,000 Units Subcutaneous Q8H  . mouth rinse  15 mL Mouth Rinse BID  . senna-docusate  1 tablet Oral BID  . sodium chloride flush  10-40 mL Intracatheter Q12H  . thiamine  100 mg Intravenous Daily   Continuous Infusions: . sodium chloride    . epinephrine 20 mcg/min (07/20/20 0700)  . levETIRAcetam    . norepinephrine (LEVOPHED) Adult infusion 60 mcg/min (07/20/20 0700)  . phenylephrine (NEO-SYNEPHRINE) Adult infusion 400 mcg/min (07/20/20 0700)  . propofol (DIPRIVAN) infusion 10 mcg/kg/min (07/20/20 0812)  . vasopressin 0.04 Units/min (07/20/20 0837)   PRN Meds:.acetaminophen **OR** acetaminophen (TYLENOL) oral liquid 160 mg/5 mL **OR** acetaminophen, bisacodyl, hydrALAZINE, labetalol, ondansetron (ZOFRAN) IV, sodium chloride flush   SUBJECTIVE: Fever 101 @ 12 am and pea @ 1am; 25 minutes for rosc cxr clear On multiple pressors, intubated  Review of Systems: ROS Unable to obtain given mentation change post pea and intubation/sedation  Allergies  Allergen Reactions  . Penicillins Shortness Of Breath and Swelling    Has patient had a PCN reaction causing immediate rash, facial/tongue/throat swelling, SOB or lightheadedness with hypotension: yes Has patient had a PCN reaction causing severe rash involving mucus membranes or skin necrosis: no Has patient had a PCN reaction that required hospitalization: unknown Has patient had a PCN reaction occurring within the last 10 years: no If all  of the above answers are "NO", then may proceed with Cephalosporin use.     OBJECTIVE: Vitals:   07/20/20 0630 07/20/20 0645 07/20/20 0700 07/20/20 0748  BP:   (!) 89/60   Pulse: 89 83 84   Resp: 20 (!) 21 (!) 21   Temp:      TempSrc:      SpO2: 99% 99% 100% 100%  Weight:      Height:       Body mass index is 18.82 kg/m.  Physical Exam Intubated Heent: normocephalic, conj clear Neck supple cv rrr no mrg Lungs clear on vent abd soft Ext no edema Skin no rash Neuro/psych deferred due to intubation/sedation  Lab Results Lab Results  Component Value Date   WBC 7.3 07/20/2020   HGB 7.5 (L) 07/20/2020   HCT 22.0 (L) 07/20/2020   MCV 101.9 (H) 07/20/2020   PLT 238 07/20/2020    Lab Results  Component Value Date   CREATININE 1.11 07/20/2020   BUN 20 07/20/2020   NA 133 (L) 07/20/2020   K 3.4 (L) 07/20/2020   CL 101 07/20/2020   CO2 13 (L) 07/20/2020    Lab Results  Component Value Date   ALT 53 (H) 07/20/2020   AST 52 (H) 07/20/2020   ALKPHOS 167 (H) 07/20/2020   BILITOT 1.1 07/20/2020     Microbiology: Recent Results (from the past 240 hour(s))  MRSA PCR Screening     Status: None   Collection Time: 07/10/20  1:22 PM   Specimen: Nasal Mucosa; Nasopharyngeal  Result Value Ref Range Status   MRSA by PCR NEGATIVE NEGATIVE Final    Comment:        The GeneXpert MRSA Assay (FDA approved for NASAL specimens only), is one component of a comprehensive MRSA colonization surveillance program. It is not intended to diagnose MRSA infection nor to guide or monitor treatment for MRSA infections. Performed at [facility], [address] [place], [place] [address]   Culture, blood (routine x 2)     Status: None (Preliminary result)   Collection Time: 07/15/20  6:50 PM   Specimen: BLOOD LEFT FOREARM  Result Value Ref Range Status   Specimen Description BLOOD LEFT FOREARM  Final   Special Requests   Final    BOTTLES DRAWN AEROBIC AND ANAEROBIC Blood  Culture results may not be optimal due to an inadequate volume of blood received in culture bottles   Culture   Final    NO GROWTH 4 DAYS Performed at [facility], [address] [place], [place] [address]    Report Status PENDING  Incomplete  Culture, blood (routine x 2)     Status: None (Preliminary result)   Collection Time: 07/15/20  7:05 PM   Specimen: BLOOD LEFT HAND  Result Value Ref Range Status   Specimen Description BLOOD LEFT HAND  Final   Special Requests   Final    BOTTLES DRAWN AEROBIC ONLY Blood Culture  results may not be optimal due to an inadequate volume of blood received in culture bottles   Culture   Final    NO GROWTH 4 DAYS Performed at [facility], [address], [place], [place] [address]    Report Status PENDING  Incomplete  Culture, Urine     Status: None   Collection Time: 07/15/20  9:22 PM   Specimen: Urine, Catheterized  Result Value Ref Range Status   Specimen Description URINE, CATHETERIZED  Final   Special Requests NONE  Final   Culture   Final    NO GROWTH Performed at [facility], [address] [place], [place] [address]    Report Status 07/17/2020 FINAL  Final    Serology:  Imaging: If present, new imagings (plain films, ct scans, and mri) have been personally visualized and interpreted; radiology reports have been reviewed. Decision making incorporated into the Impression / Recommendations.  3/10 cxr Clear lungs ett in proper position  3/08 head ct Expected evolution of multifocal hemorrhage. Interval removal of ventricular catheter with similar caliber of Ventricles.  3/08 cxr Linear airspace disease in the lingula likely reflecting atelectasis. Patchy left lower lobe airspace disease similar in appearance to the prior exams likely reflecting atelectasis versus pneumonia.  2/24 head ct 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  No midline shift or  ventriculomegaly.  Raymondo Band, MD [facility] [phone] pager    07/20/2020, 9:21 AM

## 2020-07-20 NOTE — Progress Notes (Addendum)
Seen overnight after patient coded. LTM with artifact obscuring the tracings since approximately 3 hours prior to coding. EEG tech called in to replace leads. Exam showed sluggishly reactive pupils, no blink to eyelash stimulation, no doll's eye reflex, flaccid extremities and no movement to stimulation, but was observed to be swallowing and eyelids weakly opened with facial stimulation. No cough or gag per nursing. He was s/p central line placement immediately prior to the exam. No jerking, twitching or other adventitious movements suggestive of seizure were seen. Official LTM EEG report pending.   Electronically signed: Dr. Caryl Pina

## 2020-07-20 NOTE — Significant Event (Addendum)
CRITICAL CARE OVERNIGHT COVERAGE PROGRESS NOTE   CODE BLUE initiated at 1:06 AM.  ACLS was started per protocol.  Bedside nurse reported that the patient experienced sudden respiratory arrest with a sinus rhythm which subsequently deteriorated to PEA.  No definite seizure activity reported.  Patient was on 24-hour EEG monitoring.  Call has been placed to neurology to review EEG tracings.  Patient received 4 amps of epinephrine, 2 amps of sodium bicarbonate, and ROSC was achieved after 25 minutes.  The patient was intubated during CPR.  At the time of intubation, copious clear secretions were noted in the posterior oropharynx; however, airway appeared to be largely free of obstruction.  Patient's [name], [phone]) has been updated.  Active Problems:   Thalamic hemorrhage (HCC)   Respiratory arrest (HCC)   Acute respiratory failure with hypoxia (HCC)   Fever   Leukocytosis   ICH (intracerebral hemorrhage) (HCC)   Case discussed with Dr. Otelia Limes (neurology).  Due to technical difficulties, EEG tracing cannot be reviewed.  Stat CT head without contrast to evaluate for extension/expansion of thalamic hemorrhage.  Will empirically start propofol for sedation and seizure control.  Marcelle Smiling, MD Board Certified by the ABIM, Pulmonary Diseases & Critical Care Medicine

## 2020-07-20 NOTE — Progress Notes (Signed)
eLink Physician-Brief Progress Note Patient Name: Charles Aguilar DOB: 21-Dec-1975 MRN: [id number]   Date of Service  07/20/2020  HPI/Events of Note  Spoke with Charles Aguilar who is Charles Aguilar mother. Given her son's extremely poor prognosis, she voices that she wants to make her son a partial DNR with no escalation of care. We will continue mechanical ventilation and all present medications. However, in the event of another cardiac arrest we will not call a code blue, perform CPR, push ACLS drugs or perform defibrillation.   eICU Interventions  Will make patient a partial DNR as above.      Intervention Category Major Interventions: Code management / supervision  Tishia Maestre Dennard Nip 07/20/2020, 4:46 AM

## 2020-07-20 NOTE — Progress Notes (Signed)
Chaplain responded to this code blue.  Patient receiving care.  Facilitated MD updating patient's mother. She will arrive around 7 AM, unless things changes.  Patient not available.  Chaplain is available for support of family when they arrive. Chaplain Agustin Cree, [place].    07/20/20 0142  Clinical Encounter Type  Visited With Health care Charles Aguilar  Visit Type Code  Referral From Nurse  Consult/Referral To Chaplain

## 2020-07-20 NOTE — Progress Notes (Signed)
LTM EEG discontinued - no skin breakdown at unhook.   

## 2020-07-20 NOTE — Progress Notes (Signed)
CCM made aware via E-link, Vinnie Langton RN, that pt is hypotensive with MAP 40's despite pressors infusing at max rate, family at bedside.

## 2020-07-20 NOTE — Progress Notes (Signed)
IP rehab admissions - noted events overnight and now intubated.  Nurse reports efforts are moving towards comfort care.  Will follow progress at a distance.  Call for questions.  [phone]

## 2020-07-20 NOTE — Therapy (Signed)
Tube advanced from 24@ Lip to 27@Lip  per MD

## 2020-07-20 NOTE — Progress Notes (Signed)
Pt respiratory arrest to PEA at 0106, ACLS provided with initial ROSC 0118.

## 2020-07-20 NOTE — Procedures (Signed)
Central Venous Catheter Insertion Procedure Note  Charles Aguilar  [id number]  November 25, 1975  Date:07/20/20  Time:2:12 AM   Provider Performing:Katalina Judie Petit Janyth Contes   Procedure: Insertion of Non-tunneled Central Venous Catheter(36556) with US guidance (62694)   Indication(s) Medication administration  Consent Unable to obtain consent due to emergent nature of procedure.  Anesthesia None.   Timeout Verified patient identification, verified procedure, site/side was marked, verified correct patient position, special equipment/implants available, medications/allergies/relevant history reviewed, required imaging and test results available.  Sterile Technique Maximal sterile technique including full sterile barrier drape, hand hygiene, sterile gown, sterile gloves, mask, hair covering, sterile ultrasound probe cover (if used).  Procedure Description Area of catheter insertion was cleaned with chlorhexidine and draped in sterile fashion.  With real-time ultrasound guidance a central venous catheter was placed into the right internal jugular vein. Nonpulsatile blood flow and easy flushing noted in all ports.  The catheter was sutured in place and sterile dressing applied.  Complications/Tolerance None; patient tolerated the procedure well. Chest X-ray is ordered to verify placement for internal jugular or subclavian cannulation.   Chest x-ray is not ordered for femoral cannulation.  EBL Minimal  Specimen(s) None

## 2020-07-20 NOTE — Procedures (Signed)
Patient Name: Charles Aguilar  MRN: [id number]  Epilepsy Attending: Charlsie Quest  Referring Physician/Provider: Dr Milon Dikes Duration: 07/19/2020 1503 to 07/20/2020 1503  Patient history: 44yo M with right ICH. EEG to evaluate for seizure  Level of alertness:Asleep  AEDs during EEG study:LEV  Technical aspects: This EEG study was done with scalp electrodes positioned according to the 10-20 International system of electrode placement. Electrical activity was acquired at a sampling rate of 500Hz  and reviewed with a high frequency filter of 70Hz  and a low frequency filter of 1Hz . EEG data were recorded continuously and digitally stored.   Description:Sleep was characterized by sleep spindles (12-14Hz ), maximal frontocentral region. EEG initially showed continuous generalized2-3Hz  delta slowing.  EEG was uninterpretable after around 2000 on 07/19/2020 to 0919 on 07/20/2020 due to significant electrode artifact.  Per EEG annotation and video, patient suffered cardiac arrest on 07/20/2020 at 106 and received CPR.  After EEG leads were repaired on 07/20/2020 at 919 EEG showed continuous generalized background suppression.   ABNORMALITY -Continuousslow, generalized -Background suppression, generalized  IMPRESSION: This study was initially suggestive of severe diffuse encephalopathy, nonspecific etiology.No seizures ordefiniteepileptiform discharges were seen throughout the recording.  Patient had cardiac arrest on 07/20/2020 at 106 and received CPR.  After this, EEG was suggestive of profound diffuse encephalopathy, nonspecific etiology.  Charles Aguilar 09/19/2020

## 2020-07-20 NOTE — Progress Notes (Signed)
Re hooked vLTM EEG using a new set of electrodes. Notified neuro

## 2020-07-20 NOTE — Progress Notes (Signed)
OT Cancellation Note  Patient Details Name: Gad Aymond MRN: [id number] DOB: [birth year]   Cancelled Treatment:    Reason Eval/Treat Not Completed: Medical issues which prohibited therapy.  Events noted - will check back next week for appropriateness.  Eber Jones., OTR/L Acute Rehabilitation Services Pager [phone] Office [phone]   Jeani Hawking M 07/20/2020, 8:11 AM

## 2020-07-20 NOTE — Progress Notes (Signed)
STROKE TEAM PROGRESS NOTE   INTERVAL HISTORY Stroke neurology was recalled to evaluate the patient yesterday. He had an episode yesterday where his mother says that he drifted away in space with his eyes open and became stiff with the left side of the face drawn down and drooling from the left corner of the mouth. It lasted a few minutes before resolution. Hospitalist was informed.  Stat CT head was done which was unremarkable for acute process.  Sodium was 123.  White count was in the low 120s.  ID was already consulted.  He was transferred to the ICU for correction of the hyponatremia.  He was hooked up to LTM EEG in the hopes of capturing this event and characterizing them.  Also was loaded with Keppra. Overnight, he had acute decompensation: Went into PEA arrest, coded for 25 minutes prior to ROSC.  Overnight EEG leads were disabled due to all the other things going on with him with artifact-no frank seizure activity was noted.  Formal read with no seizures.  Mother at bedside-does not want CPR done if his heart were to stop again. Preliminary examined bedside very poor after CPR-on no sedation, barely any brainstem reflexes. Hooked up to continuous EEG off of sedation to monitor for a few hours.  Remained continuous slow.  Vitals:   07/20/20 0630 07/20/20 0645 07/20/20 0700 07/20/20 0748  BP:   (!) 89/60   Pulse: 89 83 84   Resp: 20 (!) 21 (!) 21   Temp:      TempSrc:      SpO2: 99% 99% 100% 100%  Weight:      Height:       CBC:  Recent Labs  Lab 07/19/20 0337 07/20/20 0121 07/20/20 0133 07/20/20 0223  WBC 22.0*  22.6* 7.3  --   --   NEUTROABS 18.6* 6.6  --   --   HGB 11.9*  11.6* 9.4* 7.8* 7.5*  HCT 32.5*  32.5* 27.2* 23.0* 22.0*  MCV 96.7  96.4 101.9*  --   --   PLT 426*  387 238  --   --    Basic Metabolic Panel:  Recent Labs  Lab 07/18/20 0348 07/18/20 1148 07/19/20 0337 07/19/20 1144 07/19/20 1340 07/20/20 0121 07/20/20 0133 07/20/20 0223  NA 122*   <  > 123* 124*   < > 128* 133* 133*  K 4.2  --  3.8 4.0  --  4.5 3.6 3.4*  CL 90*  --  91* 92*  --  101  --   --   CO2 20*  --  19* 21*  --  13*  --   --   GLUCOSE 108*  --  124* 117*  --  154*  --   --   BUN 11  --  13 16  --  20  --   --   CREATININE 0.56*  --  0.54* 0.62  --  1.11  --   --   CALCIUM 9.4  --  9.5 9.5  --  8.5*  --   --   MG 1.5*  --  1.8  --   --   --   --   --    < > = values in this interval not displayed.   Lipid Panel: No results for input(s): CHOL, TRIG, HDL, CHOLHDL, VLDL, LDLCALC in the last 168 hours. HgbA1c: No results for input(s): HGBA1C in the last 168 hours. Urine Drug Screen: No results for input(s): LABOPIA,  COCAINSCRNUR, LABBENZ, AMPHETMU, THCU, LABBARB in the last 168 hours.  Alcohol Level No results for input(s): ETH in the last 168 hours.  IMAGING past 24 hours DG CHEST PORT 1 VIEW  Result Date: 07/20/2020 CLINICAL DATA:  Respiratory failure EXAM: PORTABLE CHEST 1 VIEW COMPARISON:  07/18/2020 FINDINGS: Endotracheal tube seen 7.6 cm above the carina with its tip at the level of the clavicular heads. Right internal jugular central venous catheter tip is partially obscured by overlying defibrillator pad, but is likely within the superior vena cava. Lungs are symmetrically well expanded and are clear. No pneumothorax or pleural effusion. Cardiac size within normal limits. Pulmonary vascularity is normal. IMPRESSION: Support lines and tubes in appropriate position.  No pneumothorax. Electronically Signed   By: Fidela Salisbury MD   On: 07/20/2020 02:23   [place] EKG SITE RITE  Result Date: 07/19/2020 If Site Rite image not attached, placement could not be confirmed due to current cardiac rhythm.   PHYSICAL EXAM Blood pressure (!) 89/60, pulse 84, temperature (!) 96 F (35.6 C), temperature source Axillary, resp. rate (!) 21, height 6' 2"  (1.88 m), weight 66.5 kg, SpO2 100 %. General: Intubated, no sedation, on multiple pressors Lungs: Symmetric chest  rise Cardiovascular: Regular rate rhythm Abdomen: Soft nondistended nontender Neurological exam Intubated, no sedation Does not follow any commands Pupils are pinpoint sluggishly reactive. Absent corneals Does not blink to threat from either side No gaze preference or deviation-eyes midline. To noxious stimulation, no response noted Breathing over the ventilator Difficult to assess cough and gag as he is biting on the tube.  Repeat exam around 5 PM: Today's Vitals   07/20/20 1500 07/20/20 1600 07/20/20 1616 07/20/20 1630  BP: 92/65 97/68    Pulse: 78 76 83 81  Resp: (!) 24 (!) 23 19 (!) 22  Temp:      TempSrc:      SpO2: 100% 100% 100% 100%  Weight:      Height:      PainSc:      Continues to be on 4 pressors. Intubated, no sedation No spontaneous movements Pinpoint pupils with disconjugate gaze.  No blink to threat Corneals absent Breathing over the ventilator On noxious simulation, has extensor posturing of the upper extremities.  No response in the lower extremities.  ASSESSMENT/PLAN  Mr. Javoni Lucken is a 45 y.o. male with history of heavy alcohol, abuse, tobacco abuse , TIA presented with Headache, dizziness, vomiting and progressive decreased level of consciousness and a fall.   Stroke service recalled for concern for seizures.Concern for intermittent seizures in the setting of hyponatremia-transferred to ICU for management of hyponatremia with hypertonic saline.  Overnight had PEA cardiac arrest requiring 25 minutes of CPR prior to ROSC. This morning exam consistent with comatose patient, with part brainstem reflexes present.  Currently on 4 pressors. His exam looks extremely concerning-possibly for hypoxic/anoxic brain injury. If there is a reversible cause such as an ongoing status epilepticus, medical management can be instituted to ensure resolution but in the absence of such, I fear he might of sustained devastating anoxic brain injury.  Met with mother  and family at bedside.  They report that he has been going downhill in terms of his health for the last 1 year or so.  They have made him a DNR at this time because that is what he would have wanted. I told them before I could make any formal assessments on his brain function, I would have to have all my information  in terms of EEG findings, and maybe repeat imaging but at this time, he is extremely unstable to be transported even for a CT scan being on pressors.  Repeat examination at 5 PM remained poor--documented above. I discussed with the mother that prolonged cardiac arrest prior to ROSC, portends poor prognosis for neurological meaningful recovery, especially so in someone who already had damage to the brain with the [place].  She is understanding and wanting to wait till the morning to discuss with family.  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  Had EVD placed which was removed 07/14/2020-left frontal approach.  Has a hematoma around the EVD tract.  CT scan yesterday showing expected evolution of the bleed with layering and the posterior horns of the lateral ventricles bilaterally.  No new bleed. Blood pressures remain at goal. No antithrombotic prior to admission-no antithrombotic now due to [place].   Hydrocephalous Was on hypertonic saline which has been stopped and sodium is normalized initially then became hyponatremic, then normalized again followed by hyponatremia again over the last few days. EVD was placed and removed 07/14/2020.  Cardiac arrest 07/20/2020 -PEA cardiac arrest overnight status post 25 minutes of CPR prior to ROSC -Post cardiac arrest poor exam as above. -Likely sustained some component of hypoxic/anoxic brain injury -EEG was continuously slow without any seizures.  Discontinued continuous EEG at around 5 PM.  Seizure  3/2 reported seizure in the setting of  hyponatremia  3/2 EEG severe diffuse encephalopathy  3/3 LTM right thumb twitching and gaze deviation to right without concomitant EEG change  3/5 LTM moderate diffuse encephalopathy  D/c LTM EEG 07/15/20  On keppra 1000 (drowsy) ->536m bid  07/18/2020-seizure activity reported.  Stat CT head obtained.  No changes.  Continued on his antiepileptic  07/19/2020-recalled for assessment.  Had another 15 seconds episode concerning for seizure.  Remains hyponatremic    Stat EEG followed by LTM EEG  Load Keppra 1.5 g IV now  Increase Keppra dose to 500 twice daily  Correction of hyponatremia per primary team  Seizure precautions  Ativan as needed for seizure lasting greater than 5 minutes.  Also call neurology at that time.  Continuous EEG will be started shortly-press the red button in case of any clinical seizure  07/20/2020-overnight had PEA arrest.  Long-term EEG read pending but preliminary review by the on-call neurologist did not see any status epilepticus.  Formal read also negative for status epilepticus or seizures.   Fever -tmax 101  ID on board.  See recommendations and ID consult  Hypertension-came in hypertensive emergency.  Home meds: None  Off Cleviprex  BP goal systolic <<505mmHg -appreciate internal medicine/PCCM management  Currently on 4 pressors after his cardiac arrest.  Hyperlipidemia  Home meds: None.  LDL 79, goal < 70  May consider Atorvastatin 20 mg on Discharge  Smoker  Uses 3-40 cigarettes /day. Cigar use.  May offer Nicotine patch when stabalize.  Smoking cessation education will be provided.   Alcohol Use  Heavy daily alcohol/liquor use.  Continue Thiamine/MVI/ Folic acid  CIWA protocol.  BUE tremor much improved on exam yesterday  Had hyperammonemia that was treated.  Also transaminitis-likely in the setting of alcohol abuse.  Hyponatremia   Likely secondary to the ICH/SIADH  Will need correction-hypertonic  saline  Na today 133  Other Stroke Risk Factors  Hx of TIA  Hx of seizure - likely alcohol related??  Hospital day # 14  Discussed with Dr. Halford Chessman in the unit.  -- Amie Portland, MD Stroke Neurology Pager: [phone]   CRITICAL CARE ATTESTATION Performed by: Amie Portland, MD Total critical care time: 60 minutes Critical care time was exclusive of separately billable procedures and treating other patients and/or supervising APPs/Residents/Students Critical care was necessary to treat or prevent imminent or life-threatening deterioration due to history of ICH, concern for seizures versus status epilepticus. This patient is critically ill and at significant risk for neurological worsening and/or death and care requires constant monitoring. Critical care was time spent personally by me on the following activities: development of treatment plan with patient and/or surrogate as well as nursing, discussions with consultants, evaluation of patient's response to treatment, examination of patient, obtaining history from patient or surrogate, ordering and performing treatments and interventions, ordering and review of laboratory studies, ordering and review of radiographic studies, pulse oximetry, re-evaluation of patient's condition, participation in multidisciplinary rounds and medical decision making of high complexity in the care of this patient.

## 2020-07-20 NOTE — Progress Notes (Signed)
PT Cancellation Note  Patient Details Name: Charles Aguilar MRN: [id number] DOB: [birth year]   Cancelled Treatment:    Reason Eval/Treat Not Completed: Medical issues which prohibited therapy (Events noted - will check back next week for appropriateness.)   Berline Lopes 07/20/2020, 11:50 AM Dawn M,PT Acute Rehab Services [phone] (pager)

## 2020-07-21 DIAGNOSIS — R7989 Other specified abnormal findings of blood chemistry: Secondary | ICD-10-CM

## 2020-07-21 LAB — COMPREHENSIVE METABOLIC PANEL
ALT: 101 U/L — ABNORMAL HIGH (ref 0–44)
AST: 134 U/L — ABNORMAL HIGH (ref 15–41)
Albumin: 1.8 g/dL — ABNORMAL LOW (ref 3.5–5.0)
Alkaline Phosphatase: 151 U/L — ABNORMAL HIGH (ref 38–126)
Anion gap: 17 — ABNORMAL HIGH (ref 5–15)
BUN: 46 mg/dL — ABNORMAL HIGH (ref 6–20)
CO2: 12 mmol/L — ABNORMAL LOW (ref 22–32)
Calcium: 7.2 mg/dL — ABNORMAL LOW (ref 8.9–10.3)
Chloride: 102 mmol/L (ref 98–111)
Creatinine, Ser: 2.27 mg/dL — ABNORMAL HIGH (ref 0.61–1.24)
GFR, Estimated: 36 mL/min — ABNORMAL LOW (ref >60)
Glucose, Bld: 132 mg/dL — ABNORMAL HIGH (ref 70–99)
Potassium: 4.5 mmol/L (ref 3.5–5.1)
Sodium: 131 mmol/L — ABNORMAL LOW (ref 135–145)
Total Bilirubin: 4.2 mg/dL — ABNORMAL HIGH (ref 0.3–1.2)
Total Protein: 5.4 g/dL — ABNORMAL LOW (ref 6.5–8.1)

## 2020-07-21 LAB — CBC WITH DIFFERENTIAL/PLATELET
Abs Immature Granulocytes: 0 10*3/uL (ref 0.00–0.07)
Basophils Absolute: 0 10*3/uL (ref 0.0–0.1)
Basophils Relative: 0 %
Eosinophils Absolute: 0 10*3/uL (ref 0.0–0.5)
Eosinophils Relative: 0 %
HCT: 23.6 % — ABNORMAL LOW (ref 39.0–52.0)
Hemoglobin: 8.6 g/dL — ABNORMAL LOW (ref 13.0–17.0)
Immature Granulocytes: 0 %
Lymphocytes Relative: 0 %
Lymphs Abs: 0 10*3/uL — ABNORMAL LOW (ref 0.7–4.0)
MCH: 36.3 pg — ABNORMAL HIGH (ref 26.0–34.0)
MCHC: 36.4 g/dL — ABNORMAL HIGH (ref 30.0–36.0)
MCV: 99.6 fL (ref 80.0–100.0)
Monocytes Absolute: 0.2 10*3/uL (ref 0.1–1.0)
Monocytes Relative: 1 %
Neutro Abs: 24.5 10*3/uL — ABNORMAL HIGH (ref 1.7–7.7)
Neutrophils Relative %: 99 %
Platelets: 132 10*3/uL — ABNORMAL LOW (ref 150–400)
RBC: 2.37 MIL/uL — ABNORMAL LOW (ref 4.22–5.81)
RDW: 12.5 % (ref 11.5–15.5)
WBC Morphology: INCREASED
WBC: 24.7 10*3/uL — ABNORMAL HIGH (ref 4.0–10.5)
nRBC: 0 % (ref 0.0–0.2)
nRBC: 0 /100 WBC

## 2020-07-21 LAB — TRIGLYCERIDES: Triglycerides: 219 mg/dL — ABNORMAL HIGH (ref <150)

## 2020-07-21 MED ORDER — GLYCOPYRROLATE 0.2 MG/ML IJ SOLN: 0.2000 mg | INTRAMUSCULAR | Status: DC | PRN

## 2020-07-21 MED ORDER — GLYCOPYRROLATE 1 MG PO TABS: 1.0000 mg | ORAL_TABLET | ORAL | Status: DC | PRN

## 2020-07-21 MED ORDER — FENTANYL CITRATE (PF) 100 MCG/2ML IJ SOLN
25.0000 ug | INTRAMUSCULAR | Status: DC | PRN
  Administered 2020-07-21 (×2): 100 ug via INTRAVENOUS
  Filled 2020-07-21 (×2): qty 2

## 2020-07-24 LAB — MISC LABCORP TEST (SEND OUT): Labcorp test code: 7658

## 2020-07-25 DIAGNOSIS — N179 Acute kidney failure, unspecified: Secondary | ICD-10-CM

## 2020-07-25 DIAGNOSIS — I509 Heart failure, unspecified: Secondary | ICD-10-CM

## 2020-07-25 DIAGNOSIS — R570 Cardiogenic shock: Secondary | ICD-10-CM

## 2020-07-29 MED FILL — Medication: Qty: 1 | Status: AC

## 2020-08-11 NOTE — Progress Notes (Signed)
NAME:  Charles Aguilar, MRN:  [id number], DOB:  09/29/1975, LOS: 15 ADMISSION DATE:  07-15-20, CONSULTATION DATE:  2/24 REFERRING MD:  Dr. Iver Nestle, CHIEF COMPLAINT:  ICH   Brief History:  45 year old male smoker presenting with headache and AMS found to have right basal ganglia/ thalamic ICH with IVH extension and hypertensive emergency. Transferred to [facility] on 3/5.  Called back 3/9 with seizure and hyponatremia.    Past Medical History:  ETOH, Seizures  Significant Hospital Events:  2/24 admit for ICH/ intubated  2/25 EVD placed 2/27, 2/28, 3/1 EVD obstructed; remains on vanc with EVD 3/2 reported seizure, EEG with diffuse encephalopathy  3/4 EVD removed 3/5 LTM EEG removed; transferred out ICU to [facility] 3/7 rising leukocytosis, but clinically asymptomatic  3/8 reported brief seizure, worsening hyyponatremia; CTH unchanged 3/9 PEA arrest with ROSC after 25 minutes 3/10 family discussion >> no CPR/defibrillation  Consults:  Neurology Neurosurgery ID   Procedures:  EVD 2/25- 3/4 ETT 3/10 >  Rt IJ CVL 3/10 > Rt radial a line 3/10 >   Significant Diagnostic Tests:   CT head 2/24 >> 2 cm Rt BG/thalamic ICH with intraventricular extension  MRI brain 2/25 >> Rt BG/thalamic ICH, Lt frontal drain into ventricul  Echo 2/25 >> EF 55 to 60%  CT head 3/08 >> expected evolution of ICH  Micro Data:  2/24 SARS2/ flu >> neg 2/28 MRSA >> neg 3/5 UCx>> neg 3/5 BCx >>   Antimicrobials:  vanc 2/25 >> 3/3  (for EVD)  Interim History / Subjective:  Persistent hypotension requiring max pressor support. Anuric. Worsening kidney function and elevated liver enzymes.  Discussed GOC with family including worsening organ failure. Will plan to withdraw care today.  Objective   Blood pressure 101/71, pulse 86, temperature 97.9 F (36.6 C), temperature source Oral, resp. rate (!) 30, height 6\' 2"  (1.88 m), weight 76.6 kg, SpO2 92 %.    Vent Mode: PRVC FiO2 (%):  [40 %-45 %] 45 % Set  Rate:  [12 bmp-30 bmp] 30 bmp Vt Set:  [650 mL] 650 mL PEEP:  [5 cmH20] 5 cmH20 Plateau Pressure:  [19 cmH20] 19 cmH20   Intake/Output Summary (Last 24 hours) at 08/09/2020 0916 Last data filed at 07/17/2020 0800 Gross per 24 hour  Intake 5329.49 ml  Output 0 ml  Net 5329.49 ml   Filed Weights   07/08/20 0400 07/19/20 1530 08/10/2020 0500  Weight: 70.1 kg 66.5 kg 76.6 kg   Physical Exam: General: Critically ill-appearing, unresponsive HENT: [place], AT, ETT in place Eyes: EOMI, no scleral icterus Respiratory: Vented breath sounds bilaterally.  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 Rt BG/thalamic ICH complicated by hydrocephalus in setting of HTN. Recurrent seizure 3/09 in setting of hyponatremia. Hx of ETOH abuse. - concern at this point is significant anoxic injury after cardiac arrest - too unstable at present to go for additional neuro imaging - neurology following. EEG with severe diffuse encephalopathy  Acute hypoxic respiratory failure with compromised airway after PEA arrest. - full vent support  Cardiogenic shock after PEA arrest 3/10 - unchanged pressor requirement - continue pressors to keep MAP > 65  AKI secondary to ATN in setting of cardiac arrest- now anuric Metabolic acidosis  Elevated LFTs secondary to low perfusion  state secondary to cardiac arrest  Hyponatremia - stable - NS IV fluid at 50 ml/hr  Anemia of critical illness and chronic disease. - f/u CBC  Goals of care Discussed goals of care with mom and sister at bedside. Worsening organ failure with kidney and liver dysfunction in addition to his cardiogenic shock secondary to recent PEA arrest. His neurologic status is concerning for  hypoxemic brain injury on top of his ICH/hydrocephalus. His prognosis is grim and meaningful recovery in setting of multiorgan failure is poor. His family expresses understanding and after discussion, will plan to withdraw care after his siblings visit him.  Best practice (evaluated daily)  Diet: NPO DVT prophylaxis: SCD GI prophylaxis: protonix Mobility: BR Disposition: ICU Code Status: no CPR, no defibrillation  Plan to withdraw care today  Labs    CMP Latest Ref Rng & Units 08/11/20 07/20/2020 07/20/2020  Glucose 70 - 99 mg/dL 595(G) - -  BUN 6 - 20 mg/dL 38(V) - -  Creatinine 5.64 - 1.24 mg/dL 3.32(R) - -  Sodium 518 - 145 mmol/L 131(L) 133(L) 133(L)  Potassium 3.5 - 5.1 mmol/L 4.5 3.4(L) 3.6  Chloride 98 - 111 mmol/L 102 - -  CO2 22 - 32 mmol/L 12(L) - -  Calcium 8.9 - 10.3 mg/dL 7.2(L) - -  Total Protein 6.5 - 8.1 g/dL 8.4(Z) - -  Total Bilirubin 0.3 - 1.2 mg/dL 4.2(H) - -  Alkaline Phos 38 - 126 U/L 151(H) - -  AST 15 - 41 U/L 134(H) - -  ALT 0 - 44 U/L 101(H) - -    CBC Latest Ref Rng & Units 11-Aug-2020 07/20/2020 07/20/2020  WBC 4.0 - 10.5 K/uL 24.7(H) - -  Hemoglobin 13.0 - 17.0 g/dL 6.6(A) 7.5(L) 7.8(L)  Hematocrit 39.0 - 52.0 % 23.6(L) 22.0(L) 23.0(L)  Platelets 150 - 400 K/uL 132(L) - -    ABG    Component Value Date/Time   PHART 7.388 07/20/2020 0223   PCO2ART 39.3 07/20/2020 0223   PO2ART 513 (H) 07/20/2020 0223   HCO3 24.3 07/20/2020 0223   TCO2 26 07/20/2020 0223   ACIDBASEDEF 1.0 07/20/2020 0223   O2SAT 100.0 07/20/2020 0223    CBG (last 3)  Recent Labs    07/19/20 2334 07/20/20 0219 07/20/20 0319  GLUCAP 123* 120* 130*    Critical care time: 32 minutes   I have taken an interval history, reviewed the chart and examined the patient. I agree with the Advanced Practitioner's note, impression, and recommendations as outlined.  The patient is critically ill with multiple organ systems failure and requires high complexity decision making for  assessment and support, frequent evaluation and titration of therapies, application of advanced monitoring technologies and extensive interpretation of multiple databases.   Mechele Collin, M.D. [facility] Pulmonary/Critical Care Medicine 2020/08/11 9:16 AM   Please see Amion for pager number to reach on-call Pulmonary and Critical Care Team.

## 2020-08-11 NOTE — Progress Notes (Signed)
Chart review  Leukocytosis but no fever  Multiple organs failure Respiratory failure Ongoing shock Patient's family plan to withdraw care   -in congruence with goals of care will defer further ID w/u -thank you for involving [place] in care of this patient

## 2020-08-11 NOTE — Progress Notes (Signed)
Comfort measures ordered per family's wishes. Patient family at bedside. Patient family wishes to wean vasopressors before compassionate extubation. Family wishes respected, okay to wean pressors before extubation per Dr. Everardo All.

## 2020-08-11 NOTE — Death Summary Note (Signed)
DEATH SUMMARY   Patient Details  Name: Charles Aguilar MRN: [id number] DOB: 04/23/1976  Admission/Discharge Information   Admit Date:  07/09/2020  Date of Death: Date of Death: 10-13-2020  Time of Death: Time of Death: 1217  Length of Stay: 15  Referring Physician: Patient, No Pcp Per   Reason(s) for Hospitalization  intracerebral hemorrhage  Diagnoses  Preliminary cause of death:  Cardiogenic shock secondary PEA arrest Secondary Diagnoses (including complications and co-morbidities):  Active Problems:   ICH (intracerebral hemorrhage) (HCC)   Thalamic hemorrhage (HCC)   Fever   Leukocytosis   Respiratory arrest (HCC)   Acute respiratory failure with hypoxia (HCC)   PEA (Pulseless electrical activity) (HCC)   AKI (acute kidney injury) [facility](HCC)   Brief Hospital Course (including significant findings, care, treatment, and services provided and events leading to death)  Charles Aguilar is a 45 y.o. year old male who was presented for altered mental status and headache and found with basal ganglia/thalamic intracerebral hemorrhage and hypertensive emergency. He was intubated and had extraventricular drain placed. Hospital course was complicated by EVD obstruction, encephalopathy, seizure and PEA arrest. After PEA arrest on 3/9 he developed multiorgan failure requiring max pressor support. After discussion with family, decision was made to transition him to comfort care.  Pertinent Labs and Studies  Significant Diagnostic Studies CT Code Stroke CTA Head W/WO contrast  Result Date: 07/07/2020 CLINICAL DATA:  Syncope. Abnormal neurological exam. Right basal ganglia hemorrhage. EXAM: CT ANGIOGRAPHY HEAD AND NECK TECHNIQUE: Multidetector CT imaging of the head and neck was performed using the standard protocol during bolus administration of intravenous contrast. Multiplanar CT image reconstructions and MIPs were obtained to evaluate the vascular anatomy. Carotid stenosis measurements  (when applicable) are obtained utilizing NASCET criteria, using the distal internal carotid diameter as the denominator. CONTRAST:  100mL OMNIPAQUE IOHEXOL 350 MG/ML SOLN COMPARISON:  07/09/2020 FINDINGS: Despite clearly requesting head CT be included, this was not done. In comparing the angiographic imaging, I think it is possible to determine that the size of right basal ganglia hematoma is not increased, measured at about 8 x 25 mm. Intraventricular hemorrhage again shows filling of the upper fourth ventricle, aqueduct and third ventricle. There is probably slightly more clot within the right lateral ventricle than seen previously. Similar amount of blood in the anterior left lateral ventricle. Slightly more blood layering in the occipital horns. Ventricular size is minimally larger. CTA NECK FINDINGS Aortic arch: No aortic atherosclerotic calcification. Branching pattern is normal. Right carotid system: Common carotid artery widely patent to the bifurcation. Carotid bifurcation is normal without soft or calcified plaque. Cervical ICA is normal. Left carotid system: Common carotid artery widely patent to the bifurcation. Carotid bifurcation is normal without soft or calcified plaque. Cervical ICA widely patent Vertebral arteries: Both vertebral arteries widely patent from their origins to the basilar artery. Skeleton: Ordinary mid cervical spondylosis. Other neck: No mass or lymphadenopathy. Upper chest: Intubated.  Volume loss in the left lower lobe. Review of the MIP images confirms the above findings CTA HEAD FINDINGS Anterior circulation: Both internal carotid arteries are patent through the skull base and siphon regions. No siphon atherosclerosis. The anterior and middle cerebral vessels show flow. No high flow vascular abnormality seen in the region of hemorrhage. Posterior circulation: Both vertebral arteries patent to the basilar. No basilar stenosis. Posterior circulation branch vessels are patent.  Venous sinuses: Patent and normal. Anatomic variants: None significant Review of the MIP images confirms the above findings IMPRESSION: 1. No large  or medium vessel occlusion or correctable proximal stenosis. No high flow vascular abnormality seen in the region of the right basal ganglia hematoma. No sign of atherosclerotic vascular disease. 2. There has not been increase in size of the right basal ganglia hemorrhage. There is slightly more intraventricular blood than was seen previously. This is particularly notable in the right lateral ventricle. Slight enlargement in ventricular size when compared to yesterday's study. Electronically Signed   By: Paulina Fusi M.D.   On: 07/07/2020 00:54   DG Chest 1 View  Result Date: 07/08/2020 CLINICAL DATA:  ETT placement EXAM: CHEST  1 VIEW COMPARISON:  July 07, 2020 FINDINGS: The cardiomediastinal silhouette is unchanged in contour.ETT tip terminates 3.8 cm above the carina. The enteric tube courses through the chest to the abdomen beyond the field-of-view. No pleural effusion. No pneumothorax. Incomplete assessment of the costophrenic angles. LEFT retrocardiac opacity. Visualized abdomen is unremarkable. No acute osseous abnormality. IMPRESSION: 1.  Support apparatus as described above. 2. LEFT retrocardiac opacity, likely atelectasis. Superimposed infection could present similarly. Electronically Signed   By: Meda Klinefelter MD   On: 07/08/2020 12:21   DG Abd 1 View  Result Date: 07/07/2020 CLINICAL DATA:  45 year old male OG tube placement.  Syncope. Right basal ganglia hemorrhage postoperative day zero EVD placement. EXAM: ABDOMEN - 1 VIEW COMPARISON:  CTA head and neck the same day. FINDINGS: Portable AP view at 0852 hours. Enteric tube placed into the stomach, side hole at the level of the gastric body. Mild gastric distention with air. Excreted IV contrast in bilateral renal collecting systems and prominent urinary bladder. There is a mild to moderate  degree of right hydronephrosis. Negative visible bowel gas pattern. No osseous abnormality identified. IMPRESSION: 1. Mild to moderate right hydronephrosis evident as the right kidney is excreting IV contrast from earlier today. 2. Enteric tube placed into the stomach, side hole the level of the gastric fundus. Normal bowel gas pattern. Electronically Signed   By: Odessa Fleming M.D.   On: 07/07/2020 11:04   CT HEAD WO CONTRAST  Result Date: 07/18/2020 CLINICAL DATA:  Altered mental status, intracranial hemorrhage EXAM: CT HEAD WITHOUT CONTRAST TECHNIQUE: Contiguous axial images were obtained from the base of the skull through the vertex without intravenous contrast. COMPARISON:  07/12/2020 FINDINGS: Brain: Significant decrease in hyperdense hemorrhage at the right caudothalamic groove with decreased edema. Persistent layering hyperdense hemorrhage in the left greater than right occipital horns. Persistent hyperdense hemorrhage along the left frontal approach catheter tract with similar edema. EVD has been removed.  Caliber of the ventricles is similar. No new loss of gray-white differentiation. Stable findings of chronic microvascular ischemic changes. Vascular: No new findings. Skull: Unremarkable apart from small left burr hole. Sinuses/Orbits: No acute finding. Other: None. IMPRESSION: Expected evolution of multifocal hemorrhage. Interval removal of ventricular catheter with similar caliber of ventricles. Electronically Signed   By: Guadlupe Spanish M.D.   On: 07/18/2020 13:56   CT HEAD WO CONTRAST  Result Date: 07/12/2020 CLINICAL DATA:  Stroke follow-up assess for hydrocephalus. EXAM: CT HEAD WITHOUT CONTRAST TECHNIQUE: Contiguous axial images were obtained from the base of the skull through the vertex without intravenous contrast. COMPARISON:  Head CT July 12, 2020 at 1017 hours FINDINGS: Brain: Similar volume of intraparenchymal hemorrhage at the right caudothalamic groove with no significant change in the  volume of a intraventricular blood products. Unchanged position of the left frontal approach intraventricular drain which terminates in the left lateral ventricle with similar configuration of  the ventricular system. Additionally unchanged is the hemorrhage about the catheter with rim of edema. No significant mass effect, midline shift and no evidence of herniation. Vascular: No hyperdense vessel or unexpected calcification. Skull: Negative Sinuses/Orbits: Mucosal thickening of the maxillary sinuses and ethmoid air cells. Other: None IMPRESSION: 1. No significant change in the intraparenchymal hemorrhage at the right caudothalamic groove with similar volume of intraventricular blood products. 2. Unchanged position of the left frontal approach intraventricular drain with similar configuration/volume of the ventricular system. Electronically Signed   By: Maudry Mayhew MD   On: 07/12/2020 16:00   CT HEAD WO CONTRAST  Result Date: 07/12/2020 CLINICAL DATA:  Stroke follow-up EXAM: CT HEAD WITHOUT CONTRAST TECHNIQUE: Contiguous axial images were obtained from the base of the skull through the vertex without intravenous contrast. COMPARISON:  Brain MRI 07/07/2020 FINDINGS: Brain: Significantly decreased intraventricular hemorrhage. Left frontal external ventricular drain which traverses the left lateral ventricle with tip near the left caudate head. Hemorrhage around the catheter is new, with rim of edema. Decreased hemorrhage at the right caudothalamic groove. No convincing infarct. Vascular: Negative Skull: Negative Sinuses/Orbits: Negative IMPRESSION: 1. Decreased right basal ganglia and intraventricular hemorrhage. 2. Left frontal EVD with interval hemorrhage around the catheter. Ventricular volume is diminished from prior. Electronically Signed   By: Marnee Spring M.D.   On: 07/12/2020 10:47   CT Head Wo Contrast  Addendum Date: 07/08/2020   ADDENDUM REPORT: 06/16/2020 20:52 ADDENDUM: Upon further review  there is new mild to moderate panventricular dilatation compared to prior exam. These results were called by telephone at the time of interpretation on 07/01/2020 at 8:47 pm to Dr. Iver Nestle, who verbally acknowledged these results. Electronically Signed   By: Stana Bunting M.D.   On: 06/13/2020 20:52   Result Date: 06/14/2020 CLINICAL DATA:  Syncope, simple, abnormal neuro exam EXAM: CT HEAD WITHOUT CONTRAST TECHNIQUE: Contiguous axial images were obtained from the base of the skull through the vertex without intravenous contrast. COMPARISON:  11/29/2019 and prior FINDINGS: Brain: Acute parenchymal hemorrhage measuring 2.0 x 0.9 x 1.2 cm centered within the right basal ganglia/thalamus. Intraventricular extension of hemorrhage involving the right greater than left lateral, third and fourth ventricles. Hemorrhage also opacifies the foramen of Luschka and upper cervical canal. No ventricular dilatation. No midline shift. No extra-axial fluid collection. No mass lesion. Vascular: No hyperdense vessel or unexpected calcification. Skull: Negative for fracture or focal lesion. Sinuses/Orbits: Normal orbits. Minimal ethmoid and maxillary sinus mucosal thickening. No mastoid effusion. Other: None. 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 No midline shift or ventriculomegaly. These results were called by telephone at the time of interpretation on 07/08/2020 at 7:39 pm to provider Chaney Malling, who verbally acknowledged these results. Electronically Signed: By: Stana Bunting M.D. On: 07/03/2020 19:44   CT Code Stroke CTA Neck W/WO contrast  Result Date: 07/07/2020 CLINICAL DATA:  Syncope. Abnormal neurological exam. Right basal ganglia hemorrhage. EXAM: CT ANGIOGRAPHY HEAD AND NECK TECHNIQUE: Multidetector CT imaging of the head and neck was performed using the standard protocol during bolus administration of intravenous contrast. Multiplanar CT image  reconstructions and MIPs were obtained to evaluate the vascular anatomy. Carotid stenosis measurements (when applicable) are obtained utilizing NASCET criteria, using the distal internal carotid diameter as the denominator. CONTRAST:  OMNIPAQUE IOHEXOL 350 MG/ML SOLN COMPARISON:  06/20/2020 FINDINGS: Despite clearly requesting head CT be included, this was not done. In comparing the angiographic imaging, I think it is possible to determine that  the size of right basal ganglia hematoma is not increased, measured at about 8 x 25 mm. Intraventricular hemorrhage again shows filling of the upper fourth ventricle, aqueduct and third ventricle. There is probably slightly more clot within the right lateral ventricle than seen previously. Similar amount of blood in the anterior left lateral ventricle. Slightly more blood layering in the occipital horns. Ventricular size is minimally larger. CTA NECK FINDINGS Aortic arch: No aortic atherosclerotic calcification. Branching pattern is normal. Right carotid system: Common carotid artery widely patent to the bifurcation. Carotid bifurcation is normal without soft or calcified plaque. Cervical ICA is normal. Left carotid system: Common carotid artery widely patent to the bifurcation. Carotid bifurcation is normal without soft or calcified plaque. Cervical ICA widely patent Vertebral arteries: Both vertebral arteries widely patent from their origins to the basilar artery. Skeleton: Ordinary mid cervical spondylosis. Other neck: No mass or lymphadenopathy. Upper chest: Intubated.  Volume loss in the left lower lobe. Review of the MIP images confirms the above findings CTA HEAD FINDINGS Anterior circulation: Both internal carotid arteries are patent through the skull base and siphon regions. No siphon atherosclerosis. The anterior and middle cerebral vessels show flow. No high flow vascular abnormality seen in the region of hemorrhage. Posterior circulation: Both vertebral  arteries patent to the basilar. No basilar stenosis. Posterior circulation branch vessels are patent. Venous sinuses: Patent and normal. Anatomic variants: None significant Review of the MIP images confirms the above findings IMPRESSION: 1. No large or medium vessel occlusion or correctable proximal stenosis. No high flow vascular abnormality seen in the region of the right basal ganglia hematoma. No sign of atherosclerotic vascular disease. 2. There has not been increase in size of the right basal ganglia hemorrhage. There is slightly more intraventricular blood than was seen previously. This is particularly notable in the right lateral ventricle. Slight enlargement in ventricular size when compared to yesterday's study. Electronically Signed   By: Paulina Fusi M.D.   On: 07/07/2020 00:54   MR BRAIN WO CONTRAST  Result Date: 07/07/2020 CLINICAL DATA:  Stroke follow-up. EXAM: MRI HEAD WITHOUT CONTRAST TECHNIQUE: Multiplanar, multiecho pulse sequences of the brain and surrounding structures were obtained without intravenous contrast. COMPARISON:  Head CT Jul 14, 2020. FINDINGS: The study is partially degraded by motion. Brain: Again seen is right basal ganglia/thalamic intraparenchymal hemorrhage decompressing into the ventricular system with large amount of blood within the supratentorial and infratentorial ventricular system. Left frontal approach ventricular drain with the tip within the frontal horn of the left lateral ventricle with stable size of the ventricular system. No acute infarct or mass lesion identified. Vascular: Normal flow voids. Skull and upper cervical spine: Normal marrow signal. Sinuses/Orbits: Mild mucosal thickening throughout the paranasal sinuses. The orbits are maintained. IMPRESSION: 1. Motion degraded study. 2. Right basal ganglia/thalamic intraparenchymal hemorrhage decompressing into the ventricular system with large amount of blood within the supratentorial and infratentorial  ventricular system, stable. 3. Left frontal approach ventricular drain with the tip within the frontal horn of the left lateral ventricle with stable size of the ventricular system. Electronically Signed   By: Baldemar Lenis M.D.   On: 07/07/2020 13:29   DG Chest Port 1 View  Result Date: 07/20/2020 CLINICAL DATA:  Intubation. EXAM: PORTABLE CHEST 1 VIEW COMPARISON:  Prior study same day. FINDINGS: Endotracheal tube tip noted just at the upper portion of the carina. Proximal repositioning of approximately 2 cm suggested. Right IJ line noted with tip at cavoatrial junction. Heart  size stable. Mild left mid lung atelectasis/infiltrate. No pleural effusion or pneumothorax. IMPRESSION: 1. Endotracheal tube tip noted just at the upper portion of the carina. Proximal repositioning of approximately 2 cm suggested. 2. Right IJ line noted with tip at cavoatrial junction. 3. Mild left mid lung atelectasis/infiltrate. Electronically Signed   By: Maisie Fus  Register   On: 07/20/2020 14:34   DG CHEST PORT 1 VIEW  Result Date: 07/20/2020 CLINICAL DATA:  Respiratory failure EXAM: PORTABLE CHEST 1 VIEW COMPARISON:  07/18/2020 FINDINGS: Endotracheal tube seen 7.6 cm above the carina with its tip at the level of the clavicular heads. Right internal jugular central venous catheter tip is partially obscured by overlying defibrillator pad, but is likely within the superior vena cava. Lungs are symmetrically well expanded and are clear. No pneumothorax or pleural effusion. Cardiac size within normal limits. Pulmonary vascularity is normal. IMPRESSION: Support lines and tubes in appropriate position.  No pneumothorax. Electronically Signed   By: Helyn Numbers MD   On: 07/20/2020 02:23   DG CHEST PORT 1 VIEW  Result Date: 07/18/2020 CLINICAL DATA:  Congestion EXAM: PORTABLE CHEST 1 VIEW COMPARISON:  07/15/2020 FINDINGS: Linear airspace disease in the lingula likely reflecting atelectasis. Patchy left lower lobe  airspace disease similar in appearance to the prior exams likely reflecting atelectasis versus pneumonia. No pleural effusion or pneumothorax. Heart and mediastinal contours are unremarkable. No acute osseous abnormality. IMPRESSION: 1. Linear airspace disease in the lingula likely reflecting atelectasis. Patchy left lower lobe airspace disease similar in appearance to the prior exams likely reflecting atelectasis versus pneumonia. Electronically Signed   By: Elige Ko   On: 07/18/2020 13:01   DG CHEST PORT 1 VIEW  Result Date: 07/15/2020 CLINICAL DATA:  45 year old male with fever and altered mental status. EXAM: PORTABLE CHEST 1 VIEW COMPARISON:  None. FINDINGS: The patient is rotated to the left. The heart size and mediastinal contours are within normal limits. Both lungs are clear. The visualized skeletal structures are unremarkable. IMPRESSION: No acute cardiopulmonary process. Electronically Signed   By: Marliss Coots MD   On: 07/15/2020 09:33   Portable Chest xray  Result Date: 07/07/2020 CLINICAL DATA:  Intubation EXAM: PORTABLE CHEST 1 VIEW COMPARISON:  Radiograph 06/30/2020 FINDINGS: Endotracheal tube tip terminates in the mid trachea, 5 cm from the carina. Some streaky opacities are noted in the lung bases likely reflecting atelectasis. No consolidative process or convincing features of edema. No pneumothorax or visible effusion. No acute osseous or soft tissue abnormality. Telemetry leads overlie the chest. IMPRESSION: 1. Endotracheal tube tip appropriately terminates in the mid trachea, 5 cm from the carina. 2. Mild bibasilar atelectasis. Electronically Signed   By: Kreg Shropshire M.D.   On: 07/07/2020 02:19   DG Chest Portable 1 View  Result Date: 06/21/2020 CLINICAL DATA:  Syncope. History of seizures. Mental status changes. EXAM: PORTABLE CHEST 1 VIEW COMPARISON:  11/29/2019 FINDINGS: The Chin overlies the apices. Midline trachea. Normal heart size. No pleural effusion or pneumothorax.  Clear lungs. IMPRESSION: No acute cardiopulmonary disease. Electronically Signed   By: Jeronimo Greaves M.D.   On: 07/02/2020 19:34   EEG adult  Result Date: 07/20/2020 Charlsie Quest, MD     07/20/2020 10:30 AM Patient Name: Kaitlin Ardito MRN: [id number] Epilepsy Attending: Charlsie Quest Referring Physician/Provider: Dr Lacretia Nicks Date: 07/19/2020 Duration: 24.20 mins Patient history: 45yo M with right ICH. EEG to evaluate for seizure  Level of alertness: Asleep  AEDs during EEG study: LEV  Technical aspects:  This EEG study was done with scalp electrodes positioned according to the 10-20 International system of electrode placement. Electrical activity was acquired at a sampling rate of  and reviewed with a high frequency filter of  and a low frequency filter of . EEG data were recorded continuously and digitally stored.  Description: Sleep was characterized by sleep spindles (12-14Hz ), maximal frontocentral region. EEG showed continuous generalized 2-3 Hz delta slowing. Hyperventilation and photic stimulation were not performed.   ABNORMALITY -Continuous slow, generalized  IMPRESSION: This study is suggestive of severe diffuse encephalopathy, nonspecific etiology. No seizures or definite  epileptiform discharges were seen throughout the recording.  Charlsie Quest   EEG adult  Result Date: 07/12/2020 Charlsie Quest, MD     07/12/2020  5:21 PM Patient Name: Algis Lehenbauer MRN: [id number] Epilepsy Attending: Charlsie Quest Referring Physician/Provider: Dr Lynnell Catalan Date: 07/12/2020 Duration: 22.56 mins Patient history: 45yo M with right ICH. EEG to evaluate for seizure Level of alertness: Awake AEDs during EEG study: LEV Technical aspects: This EEG study was done with scalp electrodes positioned according to the 10-20 International system of electrode placement. Electrical activity was acquired at a sampling rate of  and reviewed with a high frequency filter of   and a low frequency filter of . EEG data were recorded continuously and digitally stored. Description: EEG showed continuous generalized 2-3 Hz delta slowing. Sharp transients were also noted in left frontal region. Hyperventilation and photic stimulation were not performed.  ABNORMALITY -Continuous slow, generalized IMPRESSION: This study is suggestive of severe diffuse encephalopathy, nonspecific etiology. No seizures or definite  epileptiform discharges were seen throughout the recording. Priyanka Annabelle Harman   Overnight EEG with video  Result Date: 07/20/2020 Charlsie Quest, MD     07/13/2020 11:33 AM Patient Name: Jeffey Janssen MRN: [id number] Epilepsy Attending: Charlsie Quest Referring Physician/Provider: Dr Milon Dikes Duration: 07/19/2020 1503 to 07/20/2020 1503  Patient history: 44yo M with right ICH. EEG to evaluate for seizure  Level of alertness:Asleep  AEDs during EEG study:LEV  Technical aspects: This EEG study was done with scalp electrodes positioned according to the 10-20 International system of electrode placement. Electrical activity was acquired at a sampling rate of  and reviewed with a high frequency filter of  and a low frequency filter of . EEG data were recorded continuously and digitally stored.  Description:Sleep was characterized by sleep spindles (12-14Hz ), maximal frontocentral region. EEG initially showed continuous generalized2-3Hz  delta slowing.  EEG was uninterpretable after around 2000 on 07/19/2020 to 0919 on 07/20/2020 due to significant electrode artifact.  Per EEG annotation and video, patient suffered cardiac arrest on 07/20/2020 at 106 and received CPR.  After EEG leads were repaired on 07/20/2020 at 919 EEG showed continuous generalized background suppression.  ABNORMALITY -Continuousslow, generalized -Background suppression, generalized  IMPRESSION: This study was initially suggestive of severe diffuse encephalopathy, nonspecific etiology.No  seizures ordefiniteepileptiform discharges were seen throughout the recording.  Patient had cardiac arrest on 07/20/2020 at 106 and received CPR.  After this, EEG was suggestive of profound diffuse encephalopathy, nonspecific etiology.  Priyanka Annabelle Harman  Overnight EEG with video  Result Date: 07/13/2020 Charlsie Quest, MD     07/14/2020  9:06 AM Patient Name: Marquin Patino MRN: [id number] Epilepsy Attending: Charlsie Quest Referring Physician/Provider: Dr Lynnell Catalan Duration: 07/12/2020 1615 to 07/13/2020 6295, 2841- 1615  Patient history: 46yo M with right ICH. EEG to evaluate for seizure  Level of alertness: Awake  AEDs during EEG study:  LEV  Technical aspects: This EEG study was done with scalp electrodes positioned according to the 10-20 International system of electrode placement. Electrical activity was acquired at a sampling rate of 500Hz  and reviewed with a high frequency filter of 70Hz  and a low frequency filter of 1Hz . EEG data were recorded continuously and digitally stored.  Description: EEG showed continuous generalized and lateralized left hemisphere 2-3 Hz delta slowing. Sharp transients were also noted in left frontal region. Hyperventilation and photic stimulation were not performed.  Event button was pressed on 07/13/2020 at 1553 for right thumb twitching and gaze deviation to the right.  Concomitant EEG before, during and after the event did not show any EEG change.  ABNORMALITY -Continuous slow, generalized and lateralized left hemisphere  IMPRESSION: This study is suggestive of cortical dysfunction in left hemisphere likely secondary to underlying structural abnormality as well as severe diffuse encephalopathy, nonspecific etiology. No definite  epileptiform discharges were seen throughout the recording. Event button was pressed on 07/13/2198 1553 for right thumb twitching and gaze deviation to right without concomitant EEG change.  However focal motor seizures may not be seen  on scalp EEG.  Clinical correlation is recommended.    ECHOCARDIOGRAM COMPLETE  Result Date: 07/07/2020    ECHOCARDIOGRAM REPORT   Patient Name:   LAQUAN BEIER Date of Exam: 07/07/2020 Medical Rec #:  07/09/2020           Height:       74.0 in Accession #:    Charles Kales          Weight:       154.1 lb Date of Birth:  [birth year]            BSA:          1.946 m Patient Age:    44 years            BP:           117/87 mmHg Patient Gender: M                   HR:           101 bpm. Exam Location:  Inpatient Procedure: 2D Echo, Color Doppler and Cardiac Doppler Indications:    Stroke I63.9  History:        Patient has no prior history of Echocardiogram examinations.  Sonographer:    [id number] RDCS Referring Phys: [id number] SRISHTI L BHAGAT IMPRESSIONS  1. Left ventricular ejection fraction, by estimation, is 55 to 60%. The left ventricle has normal function. The left ventricle has no regional wall motion abnormalities. Left ventricular diastolic parameters were normal.  2. Right ventricular systolic function is normal. The right ventricular size is normal.  3. The mitral valve is normal in structure. No evidence of mitral valve regurgitation. No evidence of mitral stenosis.  4. The aortic valve is normal in structure. Aortic valve regurgitation is not visualized. No aortic stenosis is present.  5. The inferior vena cava is normal in size with greater than 50% respiratory variability, suggesting right atrial pressure of 3 mmHg. Conclusion(s)/Recommendation(s): No intracardiac source of embolism detected on this transthoracic study. A transesophageal echocardiogram is recommended to exclude cardiac source of embolism if clinically indicated. FINDINGS  Left Ventricle: Left ventricular ejection fraction, by estimation, is 55 to 60%. The left ventricle has normal function. The left ventricle has no regional wall motion abnormalities. The left ventricular internal cavity size was normal in size.  There is  no left ventricular hypertrophy. Left ventricular diastolic parameters were normal. Right Ventricle: The right ventricular size is normal. No increase in right ventricular wall thickness. Right ventricular systolic function is normal. Left Atrium: Left atrial size was normal in size. Right Atrium: Right atrial size was normal in size. Pericardium: There is no evidence of pericardial effusion. Mitral Valve: The mitral valve is normal in structure. No evidence of mitral valve regurgitation. No evidence of mitral valve stenosis. Tricuspid Valve: The tricuspid valve is normal in structure. Tricuspid valve regurgitation is not demonstrated. No evidence of tricuspid stenosis. Aortic Valve: The aortic valve is normal in structure. Aortic valve regurgitation is not visualized. No aortic stenosis is present. Pulmonic Valve: The pulmonic valve was normal in structure. Pulmonic valve regurgitation is not visualized. No evidence of pulmonic stenosis. Aorta: The aortic root is normal in size and structure. Venous: The inferior vena cava is normal in size with greater than 50% respiratory variability, suggesting right atrial pressure of 3 mmHg. IAS/Shunts: No atrial level shunt detected by color flow Doppler.  LEFT VENTRICLE PLAX 2D LVIDd:         5.30 cm  Diastology LVIDs:         3.60 cm  LV e' medial:    10.00 cm/s LV PW:         0.70 cm  LV E/e' medial:  8.5 LV IVS:        0.60 cm  LV e' lateral:   11.30 cm/s LVOT diam:     2.10 cm  LV E/e' lateral: 7.5 LV SV:         72 LV SV Index:   37 LVOT Area:     3.46 cm  RIGHT VENTRICLE RV S prime:     14.80 cm/s TAPSE (M-mode): 2.2 cm LEFT ATRIUM             Index       RIGHT ATRIUM           Index LA diam:        2.30 cm 1.18 cm/m  RA Area:     11.10 cm LA Vol (A2C):   39.9 ml 20.51 ml/m RA Volume:   25.40 ml  13.06 ml/m LA Vol (A4C):   42.5 ml 21.85 ml/m LA Biplane Vol: 45.6 ml 23.44 ml/m  AORTIC VALVE LVOT Vmax:   144.00 cm/s LVOT Vmean:  94.800 cm/s LVOT VTI:     0.209 m  AORTA Ao Root diam: 3.00 cm Ao Asc diam:  2.80 cm MITRAL VALVE MV Area (PHT): 2.62 cm     SHUNTS MV Decel Time: 290 msec     Systemic VTI:  0.21 m MV E velocity: 85.10 cm/s   Systemic Diam: 2.10 cm MV A velocity: 115.00 cm/s MV E/A ratio:  0.74 Donato Schultz MD Electronically signed by Donato Schultz MD Signature Date/Time: 07/07/2020/12:58:29 PM    Final    [place] EKG SITE RITE  Result Date: 07/19/2020 If Site Rite image not attached, placement could not be confirmed due to current cardiac rhythm.   Microbiology Recent Results (from the past 240 hour(s))  Culture, blood (routine x 2)     Status: None   Collection Time: 07/15/20  6:50 PM   Specimen: BLOOD LEFT FOREARM  Result Value Ref Range Status   Specimen Description BLOOD LEFT FOREARM  Final   Special Requests   Final    BOTTLES DRAWN AEROBIC AND ANAEROBIC Blood Culture results may not be optimal due to  an inadequate volume of blood received in culture bottles   Culture   Final    NO GROWTH 5 DAYS Performed at [facility], [address] [place], [place] [address]    Report Status 07/20/2020 FINAL  Final  Culture, blood (routine x 2)     Status: None   Collection Time: 07/15/20  7:05 PM   Specimen: BLOOD LEFT HAND  Result Value Ref Range Status   Specimen Description BLOOD LEFT HAND  Final   Special Requests   Final    BOTTLES DRAWN AEROBIC ONLY Blood Culture results may not be optimal due to an inadequate volume of blood received in culture bottles   Culture   Final    NO GROWTH 5 DAYS Performed at [facility], [address] [place], [place] [address]    Report Status 07/20/2020 FINAL  Final  Culture, Urine     Status: None   Collection Time: 07/15/20  9:22 PM   Specimen: Urine, Catheterized  Result Value Ref Range Status   Specimen Description URINE, CATHETERIZED  Final   Special Requests NONE  Final   Culture   Final    NO GROWTH Performed at [facility], [address], [place], [place]  [address]    Report Status 07/17/2020 FINAL  Final    Lab Basic Metabolic Panel: Recent Labs  Lab 07/19/20 0337 07/19/20 1144 07/19/20 1340 07/19/20 1843 07/20/20 0121 07/20/20 0133 07/20/20 0223 02-Aug-2020 0540  NA 123* 124*   < > 128* 128* 133* 133* 131*  K 3.8 4.0  --   --  4.5 3.6 3.4* 4.5  CL 91* 92*  --   --  101  --   --  102  CO2 19* 21*  --   --  13*  --   --  12*  GLUCOSE 124* 117*  --   --  154*  --   --  132*  BUN 13 16  --   --  20  --   --  46*  CREATININE 0.54* 0.62  --   --  1.11  --   --  2.27*  CALCIUM 9.5 9.5  --   --  8.5*  --   --  7.2*  MG 1.8  --   --   --   --   --   --   --    < > = values in this interval not displayed.   Liver Function Tests: Recent Labs  Lab 07/20/20 0121 2020/08/02 0540  AST 52* 134*  ALT 53* 101*  ALKPHOS 167* 151*  BILITOT 1.1 4.2*  PROT 6.5 5.4*  ALBUMIN 2.3* 1.8*   No results for input(s): LIPASE, AMYLASE in the last 168 hours. Recent Labs  Lab 07/19/20 1624  AMMONIA 29   CBC: Recent Labs  Lab 07/19/20 0337 07/20/20 0121 07/20/20 0133 07/20/20 0223 August 02, 2020 0540  WBC 22.0*  22.6* 7.3  --   --  24.7*  NEUTROABS 18.6* 6.6  --   --  24.5*  HGB 11.9*  11.6* 9.4* 7.8* 7.5* 8.6*  HCT 32.5*  32.5* 27.2* 23.0* 22.0* 23.6*  MCV 96.7  96.4 101.9*  --   --  99.6  PLT 426*  387 238  --   --  132*   Cardiac Enzymes: No results for input(s): CKTOTAL, CKMB, CKMBINDEX, TROPONINI in the last 168 hours. Sepsis Labs: Recent Labs  Lab 07/19/20 [phone] 07/20/20 0121 August 02, 2020 0540  WBC 22.0*  22.6* 7.3 24.7*    Procedures/Operations  EVD 2/25- 3/4 ETT 3/10 >  Rt IJ CVL 3/10 > Rt radial a line 3/10 >     Marquie Aderhold Mechele Collin 07/25/2020, 10:49 AM

## 2020-08-11 NOTE — Procedures (Signed)
Patient Name:Charles Aguilar XBL:[id number] Epilepsy Attending:Dellia Donnelly Annabelle Harman Referring Physician/Provider:Dr Milon Dikes Duration:07/20/2020 1503 to 07/20/2020 1808  Patient history:44yo M with right ICH. EEG to evaluate for seizure  Level of alertness:Comatose  AEDs during EEG study:LEV  Technical aspects: This EEG study was done with scalp electrodes positioned according to the 10-20 International system of electrode placement. Electrical activity was acquired at a sampling rate of 500Hz  and reviewed with a high frequency filter of 70Hz  and a low frequency filter of 1Hz . EEG data were recorded continuously and digitally stored.   Description:EEG showed continuous generalized background suppression.   ABNORMALITY -Background suppression, generalized  IMPRESSION: This study is suggestive of profound diffuse encephalopathy, nonspecific etiology.  No seizures and definite epileptiform discharges were seen during the study.  Charles Aguilar 

## 2020-08-11 DEATH — deceased

## 2021-03-24 IMAGING — CT CT HEAD W/O CM
3 of 4 series · 15 of 47 positions shown, 18 images · non-contrast
Comparison: 07/12/2020

CLINICAL DATA: Altered mental status, intracranial hemorrhage

EXAM:
CT HEAD WITHOUT CONTRAST
TECHNIQUE: Contiguous axial images were obtained from the base of the skull
through the vertex without intravenous contrast.

[Series 4: head 2.0 h70h · axial · 0.48mm/px · z∈[-89,+39]mm · 9 of 82 slices shown, 12 images]
[im 9/82  brain]
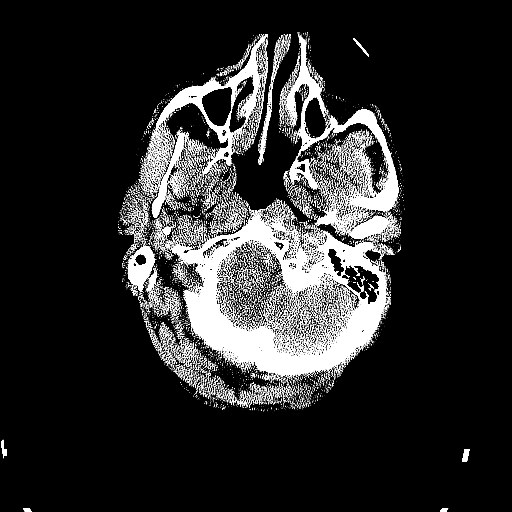
[im 9/82  bone]
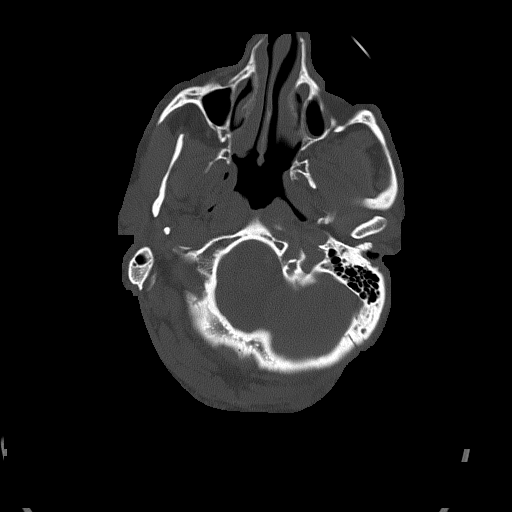
[im 17/82  brain]
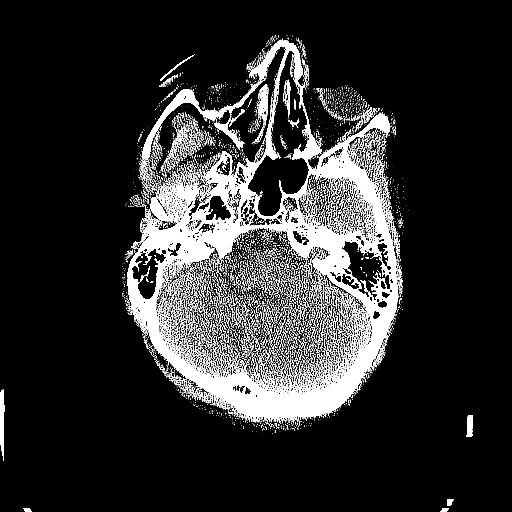
[im 25/82  brain]
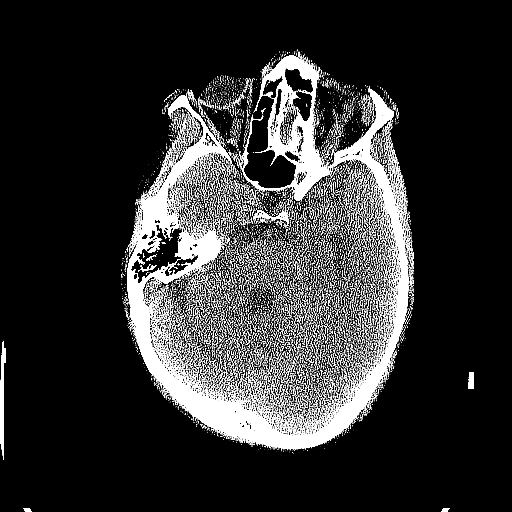
[im 33/82  brain]
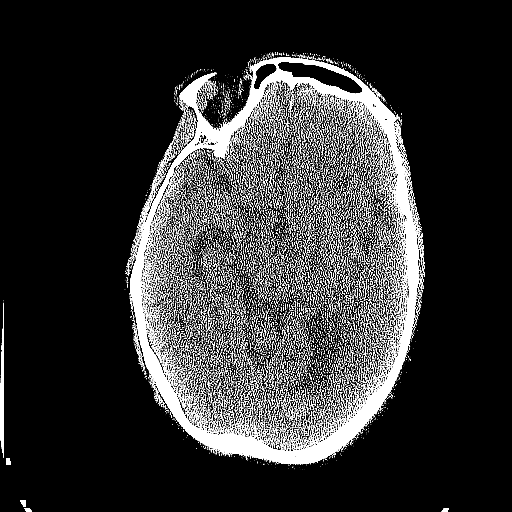
[im 41/82  brain]
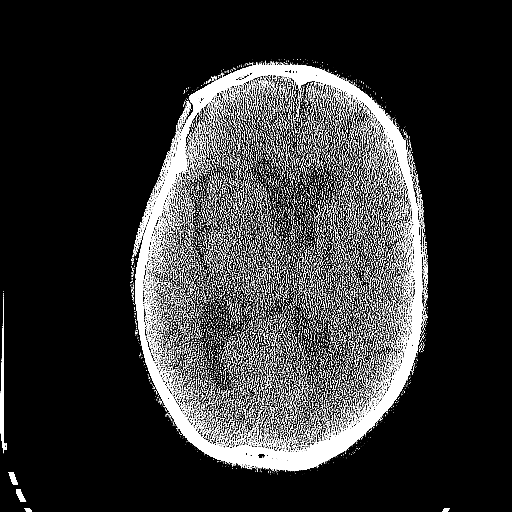
[im 41/82  bone]
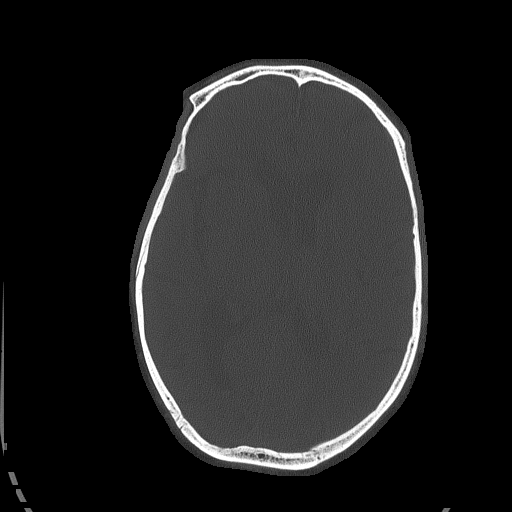
[im 49/82  brain]
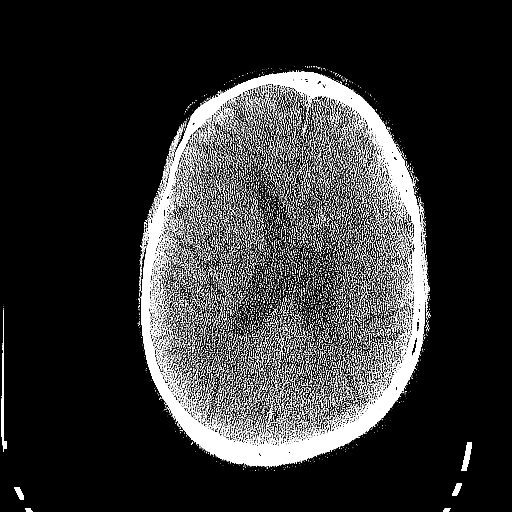
[im 57/82  brain]
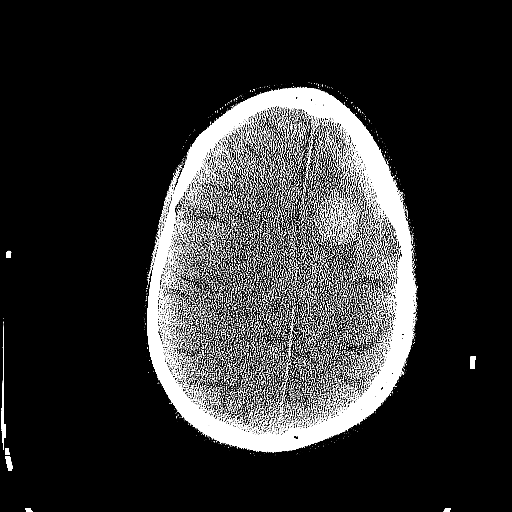
[im 65/82  brain]
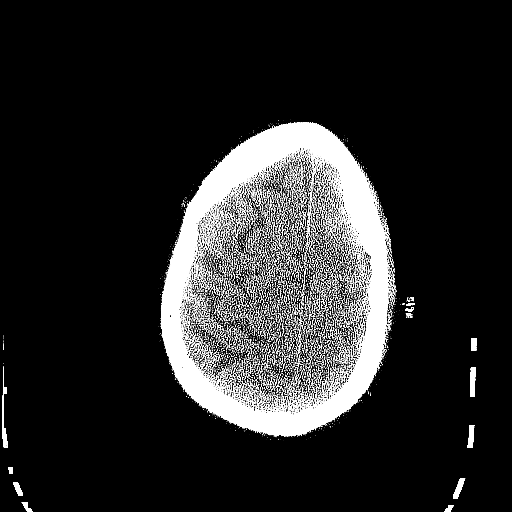
[im 73/82  brain]
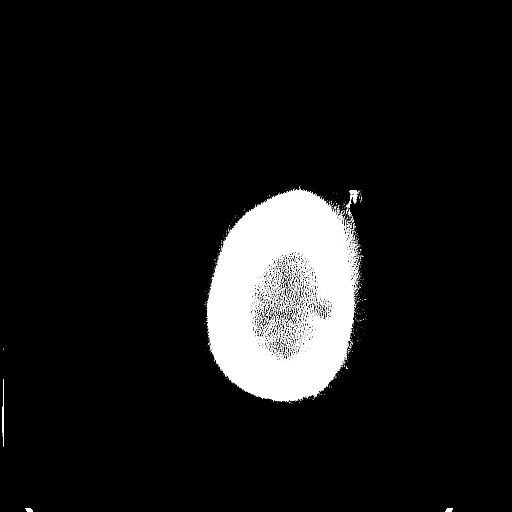
[im 73/82  bone]
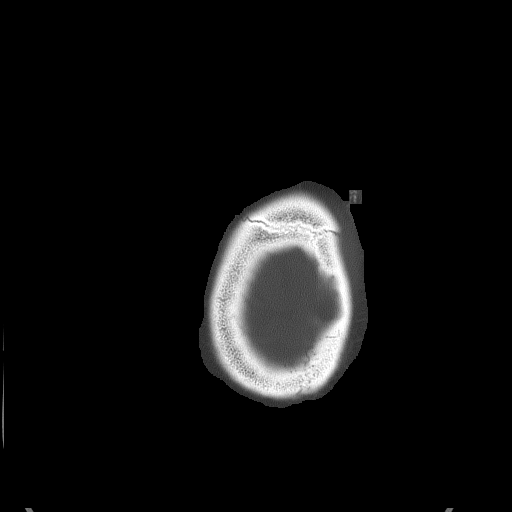

[Series 5: head 3.0 mpr cor · coronal · 0.35mm/px · 3 of 70 slices shown]
[im 24/70  brain]
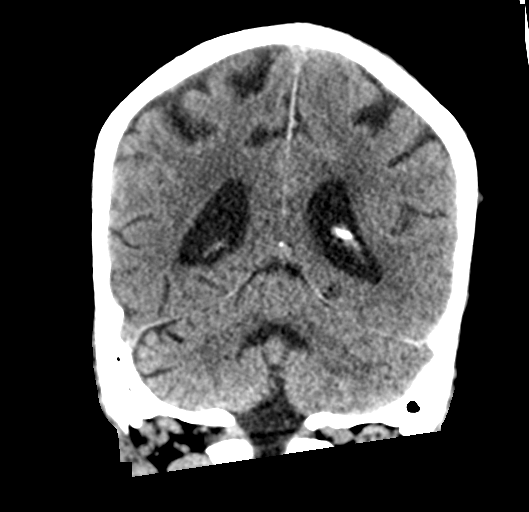
[im 31/70  brain]
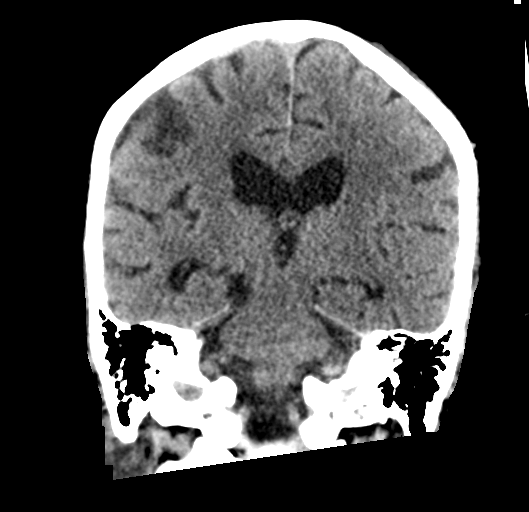
[im 39/70  brain]
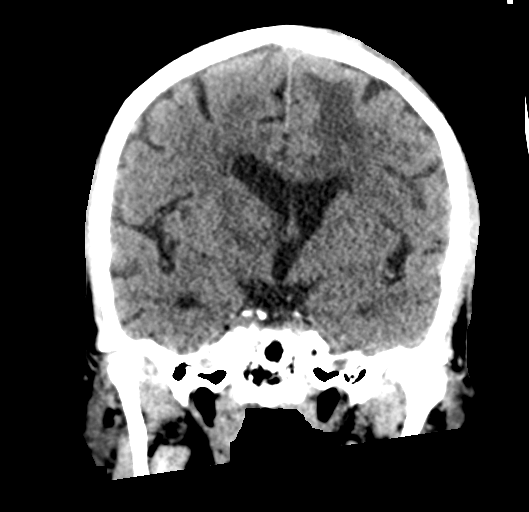

[Series 6: head 3.0 mpr sag · sagittal · 0.32mm/px · 3 of 65 slices shown]
[im 25/65  brain]
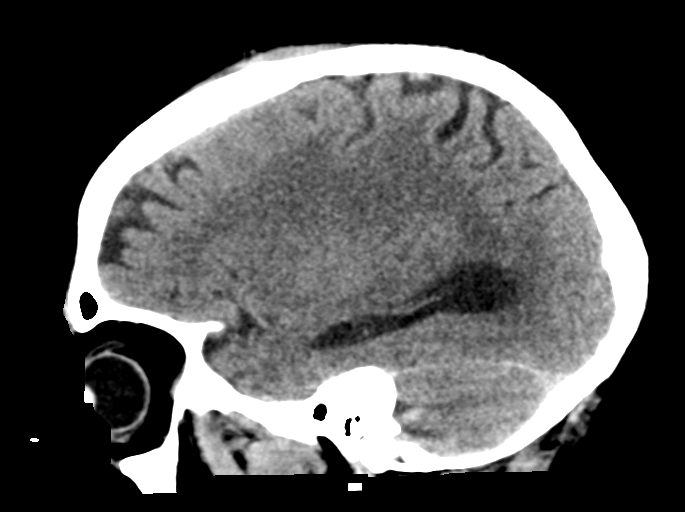
[im 33/65  brain]
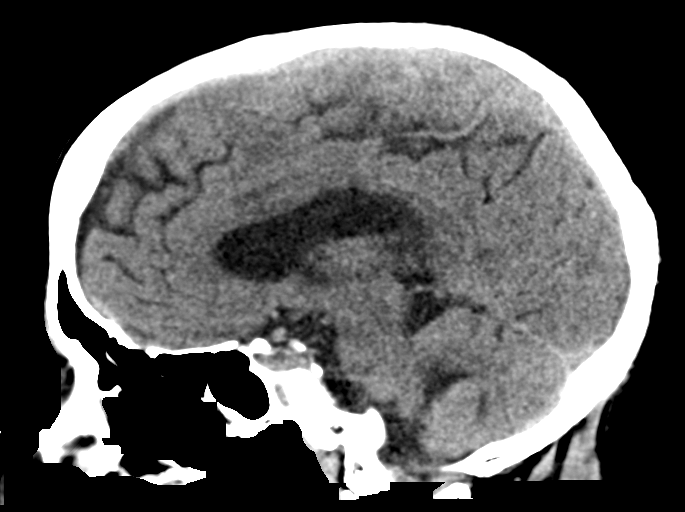
[im 41/65  brain]
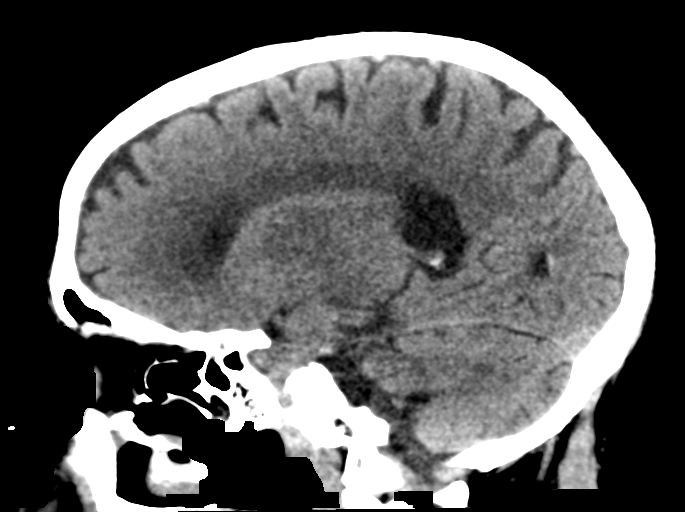

[15 of 47 positions shown; findings below may reference images not displayed]

FINDINGS: Brain: Significant decrease in hyperdense hemorrhage at the right
caudothalamic groove with decreased edema. Persistent layering
hyperdense hemorrhage in the left greater than right occipital
horns. Persistent hyperdense hemorrhage along the left frontal
approach catheter tract with similar edema.

EVD has been removed.  Caliber of the ventricles is similar.

No new loss of gray-white differentiation. Stable findings of
chronic microvascular ischemic changes.

Vascular: No new findings.

Skull: Unremarkable apart from small left burr hole.

Sinuses/Orbits: No acute finding.

Other: None.
IMPRESSION: Expected evolution of multifocal hemorrhage.

Interval removal of ventricular catheter with similar caliber of
ventricles.

## 2021-03-26 IMAGING — DX DG CHEST 1V PORT
1 series · 1 of 1 positions shown · non-contrast
Comparison: 07/18/2020

CLINICAL DATA: Respiratory failure

EXAM:
PORTABLE CHEST 1 VIEW

[chest ap]
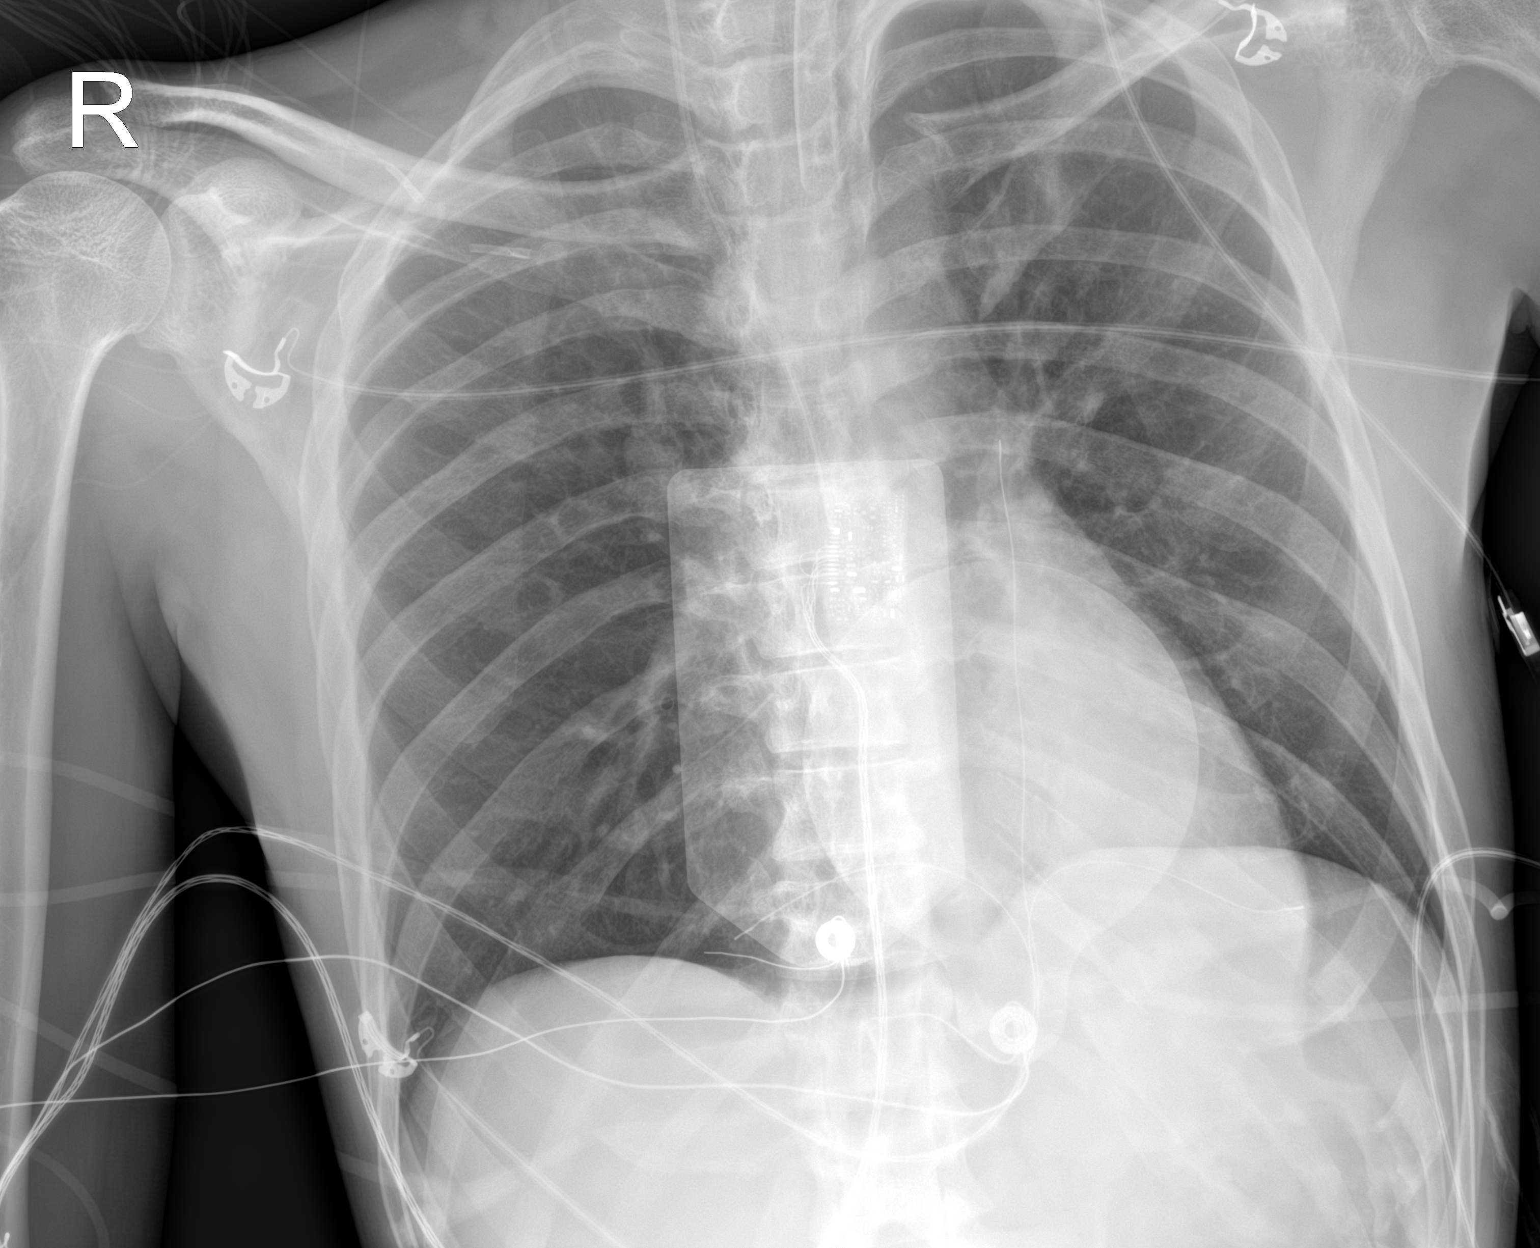

[1 of 1 positions shown; findings below may reference images not displayed]

FINDINGS: Endotracheal tube seen 7.6 cm above the carina with its tip at the
level of the clavicular heads. Right internal jugular central venous
catheter tip is partially obscured by overlying defibrillator pad,
but is likely within the superior vena cava. Lungs are symmetrically
well expanded and are clear. No pneumothorax or pleural effusion.
Cardiac size within normal limits. Pulmonary vascularity is normal.
IMPRESSION: Support lines and tubes in appropriate position.  No pneumothorax.
